# Patient Record
Sex: Female | Born: 1995
Health system: Southern US, Community
[De-identification: ages and names within clinical notes are randomized; demographics above are authoritative.]

## PROBLEM LIST (undated history)

## (undated) ENCOUNTER — Inpatient Hospital Stay (HOSPITAL_COMMUNITY): Payer: Self-pay

## (undated) DIAGNOSIS — N12 Tubulo-interstitial nephritis, not specified as acute or chronic: Secondary | ICD-10-CM

## (undated) DIAGNOSIS — J302 Other seasonal allergic rhinitis: Secondary | ICD-10-CM

## (undated) DIAGNOSIS — D649 Anemia, unspecified: Secondary | ICD-10-CM

## (undated) HISTORY — PX: WISDOM TOOTH EXTRACTION: SHX21

## (undated) HISTORY — PX: MOUTH SURGERY: SHX715

---
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
## 2016-01-04 ENCOUNTER — Emergency Department (HOSPITAL_COMMUNITY)
Admission: EM | Admit: 2016-01-04 | Discharge: 2016-01-04 | Disposition: A | Discharge status: Home / Self Care | Payer: Medicaid Other | Attending: Physician Assistant | Admitting: Physician Assistant

## 2016-01-04 ENCOUNTER — Encounter (HOSPITAL_COMMUNITY): Payer: Self-pay | Admitting: Emergency Medicine

## 2016-01-04 DIAGNOSIS — F17210 Nicotine dependence, cigarettes, uncomplicated: Secondary | ICD-10-CM | POA: Insufficient documentation

## 2016-01-04 DIAGNOSIS — K0889 Other specified disorders of teeth and supporting structures: Secondary | ICD-10-CM | POA: Insufficient documentation

## 2016-01-04 DIAGNOSIS — Z79899 Other long term (current) drug therapy: Secondary | ICD-10-CM | POA: Insufficient documentation

## 2016-01-04 MED ORDER — CLINDAMYCIN HCL 150 MG PO CAPS: 450.0000 mg | ORAL_CAPSULE | Freq: Three times a day (TID) | ORAL | 0 refills | Status: AC

## 2016-01-04 NOTE — ED Notes (Signed)
Declined W/C at D/C and was escorted to lobby by RN. 

## 2016-01-04 NOTE — ED Triage Notes (Signed)
Pt. Stated, I broke my tooth off bottom right about a week half ago, and now my face is swollen Im pretty sure I have an abscess

## 2016-01-04 NOTE — Discharge Instructions (Signed)
Please schedule an appointment with your dentist today for follow up. Take Clindamycin  Contact a health care provider if: You continue to have tooth pain after a tooth injury. Your tooth is sensitive to heat and cold. You develop swelling near your injured tooth. You have a fever. You are unable to open your jaw. You are drooling and it is getting worse.

## 2016-01-04 NOTE — ED Provider Notes (Signed)
[facility]-EMERGENCY DEPT Provider Note   CSN: [id number] Arrival date & time: 01/04/16  1636 By signing my name below, I, Sherry Leach, attest that this documentation has been prepared under the direction and in the presence of Sherry Leach, [place]. Electronically Signed: Bridgette HabermannMaria Leach, ED Scribe. 01/04/16. 5:46 PM.  History   Chief Complaint Chief Complaint  Patient presents with  . Dental Problem  . Facial Swelling   HPI Comments: Sherry Leach is a 20 y.o. female with no pertinent PMHx, who presents to the Emergency Department complaining of gradually worsening, 8/10, lower right dental pain onset 3 days ago with associated facial swelling. Pt reports she broke a bottom right tooth eating about 1.5 weeks ago. Pain is exacerbated with chewing. She has taken 500mg  Ibuprofen with temporary relief. Pt has h/o abscesses and notes her symptoms feel "exactly the same". She does not have a dentist she regularly follows up with at this time. Denies IV drug use. Pt further denies fever, chills, chest pain, shortness of breath, nausea, vomiting, or any other associated symptoms.   The history is provided by the patient. No language interpreter was used.    History reviewed. No pertinent past medical history.  Patient Active Problem List   Diagnosis Date Noted  . Dry skin dermatitis 02/15/2013  . Candidiasis of vulva and vagina 02/15/2013  . Dysmenorrhea 02/15/2013  . Anemia 02/15/2013  . Irregular menstrual cycle 02/15/2013    Past Surgical History:  Procedure Laterality Date  . MOUTH SURGERY      OB History    No data available       Home Medications    Prior to Admission medications   Medication Sig Start Date End Date Taking? Authorizing Provider  amoxicillin (AMOXIL) 500 MG capsule Take 1 capsule (500 mg total) by mouth 3 (three) times daily. Patient not taking: Reported on 01/04/2016 08/03/13   Sherry Speedobyn M Hess, PA-C  ciprofloxacin (CIPRO) 500 MG tablet Take 1 tablet (500 mg total) by  mouth 2 (two) times daily. One po bid x 7 days Patient not taking: Reported on 01/04/2016 08/15/13   Sherry Boozerobyn M Hess, PA-C  clindamycin (CLEOCIN) 150 MG capsule Take 3 capsules (450 mg total) by mouth 3 (three) times daily. 01/04/16 01/11/16  Sherry Leach Sherry Leach, Sherry Leach  HYDROcodone-acetaminophen (NORCO/VICODIN) 5-325 MG per tablet Take 1-2 tablets by mouth every 4 (four) hours as needed. Patient not taking: Reported on 01/04/2016 08/15/13   Sherry Speedobyn M Hess, PA-C  ibuprofen (ADVIL,MOTRIN) 600 MG tablet Take 1 tablet (600 mg total) by mouth every 6 (six) hours as needed. Patient not taking: Reported on 01/04/2016 08/03/13   Sherry Boozerobyn M Hess, PA-C  ondansetron (ZOFRAN) 4 MG tablet Take 1 tablet (4 mg total) by mouth every 6 (six) hours. Patient not taking: Reported on 01/04/2016 08/15/13   Sherry Speedobyn M Hess, PA-C    Family History Family History  Problem Relation Age of Onset  . Hypertension Mother   . Hypertension Other     Social History Social History  Substance Use Topics  . Smoking status: Current Every Day Smoker    Types: Cigarettes  . Smokeless tobacco: Never Used  . Alcohol use No     Allergies   Penicillins   Review of Systems Review of Systems  Constitutional: Negative for chills and fever.  HENT: Positive for dental problem and facial swelling.   Respiratory: Negative for shortness of breath.   Cardiovascular: Negative for chest pain.  Gastrointestinal: Negative for abdominal pain, constipation, diarrhea, nausea  and vomiting.  Genitourinary: Negative for difficulty urinating.     Physical Exam Updated Vital Signs BP 120/71 (BP Location: Right Arm)   Pulse 61   Temp 98.4 F (36.9 C) (Oral)   Resp 15   Ht 5' 8.5" (1.74 m)   Wt 63.5 kg   LMP 12/21/2015   SpO2 100%   BMI 20.98 kg/m   Physical Exam  Constitutional: She appears well-developed and well-nourished.  HENT:  Head: Normocephalic.  Mouth/Throat: Uvula is midline and oropharynx is clear and moist. Abnormal dentition.    Broken lower right second molar that is tender to palpation. No obvious swelling or erythema of gum. Mild swelling of the right cheek.  Eyes: Conjunctivae and EOM are normal. Pupils are equal, round, and reactive to light.  Neck: Normal range of motion. Neck supple.  Cardiovascular: Normal rate, regular rhythm and normal heart sounds.  Exam reveals no gallop and no friction rub.   No murmur heard. Pulmonary/Chest: Effort normal and breath sounds normal. No respiratory distress. She has no wheezes. She has no rales.  Abdominal: She exhibits no distension.  Musculoskeletal: Normal range of motion.  Neurological: She is alert.  Skin: Skin is warm and dry.  Psychiatric: She has a normal mood and affect. Her behavior is normal.  Nursing note and vitals reviewed.   ED Treatments / Results  DIAGNOSTIC STUDIES: Oxygen Saturation is 97% on RA, adequate by my interpretation.    COORDINATION OF CARE: 5:41 PM Discussed treatment plan with pt at bedside and pt agreed to plan.  Labs (all labs ordered are listed, but only abnormal results are displayed) Labs Reviewed - No data to display  EKG  EKG Interpretation None       Radiology No results found.  Procedures Procedures (including critical care time)  Medications Ordered in ED Medications - No data to display   Initial Impression / Assessment and Plan / ED Course  I have reviewed the triage vital signs and the nursing notes.  Pertinent labs & imaging results that were available during my care of the patient were reviewed by me and considered in my medical decision making (see chart for details).  Clinical Course   Patient with dentalgia. No obvious abscess requiring immediate incision and drainage. Exam not concerning for Ludwig's angina or pharyngeal abscess.  Will treat with antibiotics. Pt instructed to follow-up with dentist tomorrow for follow. Discussed return precautions. Pt is NAD, VSS, afebrile. Pt safe for  discharge.  Final Clinical Impressions(s) / ED Diagnoses   Final diagnoses:  Pain, dental     New Prescriptions Discharge Medication List as of 01/04/2016  6:09 PM    START taking these medications   Details  clindamycin (CLEOCIN) 150 MG capsule Take 3 capsules (450 mg total) by mouth 3 (three) times daily., Starting Mon 01/04/2016, Until Mon 01/11/2016, Print       I personally performed the services described in this documentation, which was scribed in my presence. The recorded information has been reviewed and is accurate.    [address]Cheron Coryell Sherry YorkEspina, Sherry Leach 01/04/16 1826    Courteney Randall AnLyn Mackuen, MD 01/04/16 2100

## 2016-07-21 ENCOUNTER — Emergency Department (HOSPITAL_COMMUNITY)
Admission: EM | Admit: 2016-07-21 | Discharge: 2016-07-21 | Disposition: A | Discharge status: Home / Self Care | Payer: Medicaid Other | Attending: Emergency Medicine | Admitting: Emergency Medicine

## 2016-07-21 ENCOUNTER — Encounter (HOSPITAL_COMMUNITY): Payer: Self-pay | Admitting: Emergency Medicine

## 2016-07-21 DIAGNOSIS — Z79899 Other long term (current) drug therapy: Secondary | ICD-10-CM | POA: Insufficient documentation

## 2016-07-21 DIAGNOSIS — E860 Dehydration: Secondary | ICD-10-CM | POA: Insufficient documentation

## 2016-07-21 DIAGNOSIS — R51 Headache: Secondary | ICD-10-CM | POA: Insufficient documentation

## 2016-07-21 DIAGNOSIS — R519 Headache, unspecified: Secondary | ICD-10-CM

## 2016-07-21 DIAGNOSIS — F17210 Nicotine dependence, cigarettes, uncomplicated: Secondary | ICD-10-CM | POA: Insufficient documentation

## 2016-07-21 LAB — CBC
HCT: 35.9 % — ABNORMAL LOW (ref 36.0–46.0)
HEMOGLOBIN: 11.6 g/dL — AB (ref 12.0–15.0)
MCH: 27.4 pg (ref 26.0–34.0)
MCHC: 32.3 g/dL (ref 30.0–36.0)
MCV: 84.7 fL (ref 78.0–100.0)
PLATELETS: 200 10*3/uL (ref 150–400)
RBC: 4.24 MIL/uL (ref 3.87–5.11)
RDW: 13.8 % (ref 11.5–15.5)
WBC: 8.1 10*3/uL (ref 4.0–10.5)

## 2016-07-21 LAB — BASIC METABOLIC PANEL
Anion gap: 7 (ref 5–15)
BUN: 5 mg/dL — ABNORMAL LOW (ref 6–20)
CO2: 25 mmol/L (ref 22–32)
CREATININE: 0.63 mg/dL (ref 0.44–1.00)
Calcium: 9.1 mg/dL (ref 8.9–10.3)
Chloride: 102 mmol/L (ref 101–111)
GFR calc Af Amer: >60 mL/min (ref >60)
GFR calc non Af Amer: >60 mL/min (ref >60)
Glucose, Bld: 95 mg/dL (ref 65–99)
Potassium: 4.1 mmol/L (ref 3.5–5.1)
Sodium: 134 mmol/L — ABNORMAL LOW (ref 135–145)

## 2016-07-21 LAB — I-STAT BETA HCG BLOOD, ED (MC, WL, AP ONLY): I-stat hCG, quantitative: <5 m[IU]/mL (ref <5)

## 2016-07-21 MED ORDER — ACETAMINOPHEN 325 MG PO TABS
650.0000 mg | ORAL_TABLET | Freq: Once | ORAL | Status: AC
  Administered 2016-07-21: 650 mg via ORAL
  Filled 2016-07-21: qty 2

## 2016-07-21 NOTE — ED Triage Notes (Signed)
Pt presents to ED for assesment of a left sided headache x 2 days intermittent, with episodes of intermittent dizziness and light-headedness.  Patient states she normally has orthostatic hypotension, but worse recently.  States eating and drinking normally.  Denies any other neuro deficits.

## 2016-07-21 NOTE — ED Notes (Signed)
Pt verbalized understanding discharge instructions and denies any further needs or questions at this time. VS stable, ambulatory and steady gait.   

## 2016-07-21 NOTE — ED Provider Notes (Signed)
[facility]EMER[facility]Y DEPT Provider Note   CSN: [id number] Arrival date & time: 07/21/16  2136     History   Chief Complaint Chief Complaint  Patient presents with  . Headache  . Near Syncope    HPI Sherry Leach is a 21 y.o. female.  HPI Patient ports headache 2 days and lightheadedness and weakness.  She states she's been outside and was constantly for the past 4 days in the heat.  She's been eating normally but did not drink a lot of fluids.  She feels slightly weak and dehydrated.  Denies fevers and chills.  No weakness of her arms or legs.  Denies head injury or head trauma.  No use of anticoagulants.  Denies neck pain.  No other complaints.  Symptoms are mild in severity   History reviewed. No pertinent past medical history.  Patient Active Problem List   Diagnosis Date Noted  . Dry skin dermatitis 02/15/2013  . Candidiasis of vulva and vagina 02/15/2013  . Dysmenorrhea 02/15/2013  . Anemia 02/15/2013  . Irregular menstrual cycle 02/15/2013    Past Surgical History:  Procedure Laterality Date  . MOUTH SURGERY      OB History    No data available       Home Medications    Prior to Admission medications   Medication Sig Start Date End Date Taking? Authorizing Provider  Multiple Vitamin (MULTIVITAMIN WITH MINERALS) TABS tablet Take 1 tablet by mouth daily.   Yes [provider]    Family History Family History  Problem Relation Age of Onset  . Hypertension Mother   . Hypertension Other     Social History Social History  Substance Use Topics  . Smoking status: Current Every Day Smoker    Packs/day: 0.50    Types: Cigarettes  . Smokeless tobacco: Never Used  . Alcohol use No     Allergies   Penicillins   Review of Systems Review of Systems  All other systems reviewed and are negative.    Physical Exam Updated Vital Signs BP (!) 92/59   Pulse 87   Temp 98.1 F (36.7 C) (Oral)   Resp 16   LMP 06/02/2016   SpO2 99%   Physical  Exam  Constitutional: She is oriented to person, place, and time. She appears well-developed and well-nourished. No distress.  HENT:  Head: Normocephalic and atraumatic.  Eyes: EOM are normal. Pupils are equal, round, and reactive to light.  Neck: Normal range of motion.  Cardiovascular: Normal rate, regular rhythm and normal heart sounds.   Pulmonary/Chest: Effort normal and breath sounds normal.  Abdominal: Soft. She exhibits no distension. There is no tenderness.  Musculoskeletal: Normal range of motion.  Neurological: She is alert and oriented to person, place, and time.  5/5 strength in major muscle groups of  bilateral upper and lower extremities. Speech normal. No facial asymetry.   Skin: Skin is warm and dry.  Psychiatric: She has a normal mood and affect. Judgment normal.  Nursing note and vitals reviewed.    ED Treatments / Results  Labs (all labs ordered are listed, but only abnormal results are displayed) Labs Reviewed  BASIC METABOLIC PANEL - Abnormal; Notable for the following:       Result Value   Sodium 134 (*)    BUN 5 (*)    All other components within normal limits  CBC - Abnormal; Notable for the following:    Hemoglobin 11.6 (*)    HCT 35.9 (*)  All other components within normal limits  I-STAT BETA HCG BLOOD, ED ([facility], [facility], [facility] ONLY)    EKG  EKG Interpretation None       Radiology No results found.  Procedures Procedures (including critical care time)  Medications Ordered in ED Medications  acetaminophen (TYLENOL) tablet 650 mg (not administered)     Initial Impression / Assessment and Plan / ED Course  I have reviewed the triage vital signs and the nursing notes.  Pertinent labs & imaging results that were available during my care of the patient were reviewed by me and considered in my medical decision making (see chart for details).     Patient is well-appearing.  Suspect mild dehydration.  Oral fluid replacement in the ER.  Tylenol  for headache.  Nonfocal exam.  Vital signs stable.  Final Clinical Impressions(s) / ED Diagnoses   Final diagnoses:  Acute nonintractable headache, unspecified headache type  Dehydration    New Prescriptions New Prescriptions   No medications on file     Azalia Bilisampos, Hong Timm, MD 07/21/16 2255

## 2016-07-21 NOTE — ED Notes (Signed)
Pt able to ambulate back to room, steady gait.

## 2016-11-24 ENCOUNTER — Encounter (HOSPITAL_COMMUNITY): Payer: Self-pay

## 2016-11-24 ENCOUNTER — Emergency Department (HOSPITAL_COMMUNITY)
Admission: EM | Admit: 2016-11-24 | Discharge: 2016-11-24 | Disposition: A | Discharge status: Home / Self Care | Payer: Self-pay | Attending: Emergency Medicine | Admitting: Emergency Medicine

## 2016-11-24 DIAGNOSIS — J069 Acute upper respiratory infection, unspecified: Secondary | ICD-10-CM | POA: Insufficient documentation

## 2016-11-24 DIAGNOSIS — H9202 Otalgia, left ear: Secondary | ICD-10-CM | POA: Insufficient documentation

## 2016-11-24 DIAGNOSIS — R0981 Nasal congestion: Secondary | ICD-10-CM | POA: Insufficient documentation

## 2016-11-24 DIAGNOSIS — F17210 Nicotine dependence, cigarettes, uncomplicated: Secondary | ICD-10-CM | POA: Insufficient documentation

## 2016-11-24 MED ORDER — AZITHROMYCIN 250 MG PO TABS: 250.0000 mg | ORAL_TABLET | Freq: Every day | ORAL | 0 refills | Status: DC

## 2016-11-24 NOTE — ED Triage Notes (Signed)
Patient complains of congestion, left ear pain and sore throat x 3 days, NAD

## 2016-11-24 NOTE — ED Provider Notes (Signed)
[facility] EMERGENCY DEPARTMENT Provider Note   CSN: [id number] Arrival date & time: 11/24/16  1210   History   Chief Complaint Chief Complaint  Patient presents with  . Nasal Congestion    HPI Sherry Leach is a 21 y.o. female.  HPI   Patient comes to the ER with complaints of sore throat, nasal congestion and left ear pain for the past 3 days. She has been more tired than usual and called out from work today. She has not had fevers, N/V/D, back pain, weakness, neck pain or headaches  History reviewed. No pertinent past medical history.  Patient Active Problem List   Diagnosis Date Noted  . Dry skin dermatitis 02/15/2013  . Candidiasis of vulva and vagina 02/15/2013  . Dysmenorrhea 02/15/2013  . Anemia 02/15/2013  . Irregular menstrual cycle 02/15/2013    Past Surgical History:  Procedure Laterality Date  . MOUTH SURGERY      OB History    No data available       Home Medications    Prior to Admission medications   Medication Sig Start Date End Date Taking? Authorizing Provider  azithromycin (ZITHROMAX) 250 MG tablet Take 1 tablet (250 mg total) by mouth daily. Take first 2 tablets together, then 1 every day until finished. 11/24/16   Marlon Pel, PA-C  Multiple Vitamin (MULTIVITAMIN WITH MINERALS) TABS tablet Take 1 tablet by mouth daily.    [provider]    Family History Family History  Problem Relation Age of Onset  . Hypertension Mother   . Hypertension Other     Social History Social History  Substance Use Topics  . Smoking status: Current Every Day Smoker    Packs/day: 0.50    Types: Cigarettes  . Smokeless tobacco: Never Used  . Alcohol use No     Allergies   Penicillins   Review of Systems Review of Systems  Negative ROS aside from pertinent positives and negatives as listed in HPI  Physical Exam Updated Vital Signs BP 130/86   Pulse 86   Temp 98.2 F (36.8 C)   Resp 16   SpO2 98%    Physical Exam  Constitutional: She is oriented to person, place, and time. She appears well-developed and well-nourished. No distress.  HENT:  Head: Normocephalic and atraumatic.  Right Ear: Tympanic membrane, external ear and ear canal normal.  Left Ear: Tympanic membrane, external ear and ear canal normal.  Nose: No rhinorrhea. Right sinus exhibits frontal sinus tenderness. Right sinus exhibits no maxillary sinus tenderness. Left sinus exhibits frontal sinus tenderness. Left sinus exhibits no maxillary sinus tenderness.  Mouth/Throat: Uvula is midline and mucous membranes are normal. No trismus in the jaw. Normal dentition. No dental abscesses or uvula swelling. Oropharyngeal exudate and posterior oropharyngeal edema present. No posterior oropharyngeal erythema or tonsillar abscesses.  No submental edema, tongue not elevated, no trismus. No impending airway obstruction; Pt able to speak full sentences, swallow intact, no drooling, stridor, or tonsillar/uvula displacement. No palatal petechia  + nasal congestion  Eyes: Conjunctivae are normal.  Neck: Trachea normal, normal range of motion and full passive range of motion without pain. Neck supple. No neck rigidity. Normal range of motion present. No Brudzinski's sign noted.  Flexion and extension of neck without pain or difficulty. Able to breath without difficulty in extension.  Cardiovascular: Normal rate and regular rhythm.   Pulmonary/Chest: Effort normal and breath sounds normal. No stridor. No respiratory distress. She has no wheezes.  Abdominal:  Soft. There is no tenderness.  No obvious evidence of splenomegaly. Non ttp.   Musculoskeletal: Normal range of motion.  Lymphadenopathy:       Head (right side): No preauricular and no posterior auricular adenopathy present.       Head (left side): No preauricular and no posterior auricular adenopathy present.    She has cervical adenopathy.  Neurological: She is alert and oriented to  person, place, and time.  Skin: Skin is warm and dry. No rash noted. She is not diaphoretic.  Psychiatric: She has a normal mood and affect.  Nursing note and vitals reviewed.    ED Treatments / Results  Labs (all labs ordered are listed, but only abnormal results are displayed) Labs Reviewed - No data to display  EKG  EKG Interpretation None       Radiology No results found.  Procedures Procedures (including critical care time)  Medications Ordered in ED Medications - No data to display   Initial Impression / Assessment and Plan / ED Course  I have reviewed the triage vital signs and the nursing notes.  Pertinent labs & imaging results that were available during my care of the patient were reviewed by me and considered in my medical decision making (see chart for details).     Blood pressure 130/86, pulse 86, temperature 98.2 F (36.8 C), resp. rate 16, SpO2 98 %.  Sherry Leach has been evaluated today in the emergency department. The appropriate screening and testing was been performed and I believe the patient to be medically stable for discharge.   Return signs and symptoms have been discussed with the patient and/or caregivers and they have voiced their understanding. The patient has agreed to follow-up with their primary care provider or the referred specialist.    Final Clinical Impressions(s) / ED Diagnoses   Final diagnoses:  Upper respiratory tract infection, unspecified type    New Prescriptions New Prescriptions   AZITHROMYCIN (ZITHROMAX) 250 MG TABLET    Take 1 tablet (250 mg total) by mouth daily. Take first 2 tablets together, then 1 every day until finished.     Marlon PelGreene, Blandon Offerdahl, PA-C 11/24/16 1503    Cathren LaineSteinl, Kevin, MD 11/25/16 [phone]

## 2016-11-29 ENCOUNTER — Emergency Department (HOSPITAL_BASED_OUTPATIENT_CLINIC_OR_DEPARTMENT_OTHER)
Admission: EM | Admit: 2016-11-29 | Discharge: 2016-11-29 | Disposition: A | Discharge status: Home / Self Care | Payer: Self-pay | Attending: Emergency Medicine | Admitting: Emergency Medicine

## 2016-11-29 ENCOUNTER — Encounter (HOSPITAL_BASED_OUTPATIENT_CLINIC_OR_DEPARTMENT_OTHER): Payer: Self-pay | Admitting: Emergency Medicine

## 2016-11-29 DIAGNOSIS — R0981 Nasal congestion: Secondary | ICD-10-CM | POA: Insufficient documentation

## 2016-11-29 DIAGNOSIS — J309 Allergic rhinitis, unspecified: Secondary | ICD-10-CM | POA: Insufficient documentation

## 2016-11-29 DIAGNOSIS — H6122 Impacted cerumen, left ear: Secondary | ICD-10-CM | POA: Insufficient documentation

## 2016-11-29 DIAGNOSIS — F17210 Nicotine dependence, cigarettes, uncomplicated: Secondary | ICD-10-CM | POA: Insufficient documentation

## 2016-11-29 MED ORDER — FLUTICASONE PROPIONATE 50 MCG/ACT NA SUSP: 2.0000 | Freq: Every day | NASAL | 0 refills | Status: DC

## 2016-11-29 MED ORDER — CARBAMIDE PEROXIDE 6.5 % OT SOLN: 5.0000 [drp] | Freq: Two times a day (BID) | OTIC | 0 refills | Status: DC

## 2016-11-29 NOTE — ED Notes (Signed)
Large amount hard wax flushed from left ear

## 2016-11-29 NOTE — ED Provider Notes (Signed)
[facility] EMERGENCY DEPARTMENT Provider Note   CSN: [id number] Arrival date & time: 11/29/16  1706     History   Chief Complaint Chief Complaint  Patient presents with  . Otalgia    HPI Sherry Leach is a 21 y.o. female.  Sherry Leach is a 21 y.o. Female who presents to the ED complaining of left ear pain ongoing for one week. She reports she was seen in the ED recently and prescribed a course of antibiotics.  She reports her ear pain has persisted despite completion of the antibiotic. She reports associated nasal congestion. No other treatments today. No fevers. She reports feeling better after RN washed out ear due to ear wax. She denies fevers, ear discharge, sore throat, trouble swallowing.    The history is provided by the patient and medical records. No language interpreter was used.  Otalgia  Associated symptoms include rhinorrhea. Pertinent negatives include no ear discharge, no headaches, no hearing loss, no sore throat, no neck pain, no cough and no rash.    History reviewed. No pertinent past medical history.  Patient Active Problem List   Diagnosis Date Noted  . Dry skin dermatitis 02/15/2013  . Candidiasis of vulva and vagina 02/15/2013  . Dysmenorrhea 02/15/2013  . Anemia 02/15/2013  . Irregular menstrual cycle 02/15/2013    Past Surgical History:  Procedure Laterality Date  . MOUTH SURGERY      OB History    No data available       Home Medications    Prior to Admission medications   Medication Sig Start Date End Date Taking? Authorizing Provider  azithromycin (ZITHROMAX) 250 MG tablet Take 1 tablet (250 mg total) by mouth daily. Take first 2 tablets together, then 1 every day until finished. 11/24/16   Marlon PelGreene, Tiffany, PA-C  carbamide peroxide (DEBROX) 6.5 % OTIC solution Place 5 drops into both ears 2 (two) times daily. 11/29/16   Everlene Farrieransie, Allison Deshotels, PA-C  fluticasone (FLONASE) 50 MCG/ACT nasal spray Place 2 sprays into both nostrils  daily. 11/29/16   Everlene Farrieransie, Jamani Bearce, PA-C  Multiple Vitamin (MULTIVITAMIN WITH MINERALS) TABS tablet Take 1 tablet by mouth daily.    [provider]    Family History Family History  Problem Relation Age of Onset  . Hypertension Mother   . Hypertension Other     Social History Social History  Substance Use Topics  . Smoking status: Current Every Day Smoker    Packs/day: 0.50    Types: Cigarettes  . Smokeless tobacco: Never Used  . Alcohol use No     Allergies   Penicillins   Review of Systems Review of Systems  Constitutional: Negative for chills and fever.  HENT: Positive for congestion, ear pain, rhinorrhea and sneezing. Negative for ear discharge, facial swelling, hearing loss, mouth sores, sore throat, tinnitus, trouble swallowing and voice change.   Eyes: Negative for visual disturbance.  Respiratory: Negative for cough.   Musculoskeletal: Negative for neck pain.  Skin: Negative for rash.  Neurological: Negative for headaches.     Physical Exam Updated Vital Signs BP 122/87   Pulse 88   Temp 98.1 F (36.7 C) (Oral)   Resp 16   LMP 09/29/2016   SpO2 100%   Physical Exam  Constitutional: She appears well-developed and well-nourished. No distress.  Nontoxic-appearing.  HENT:  Head: Normocephalic and atraumatic.  Right Ear: External ear normal.  Left Ear: External ear normal.  Mouth/Throat: Oropharynx is clear and moist. No oropharyngeal exudate.  Left ear cerumen impaction has resolved after wash out by RN. No TM erythema or loss of landmarks bilaterally. No loss of hearing. Mild clear middle ear effusion noted bilaterally.   Eyes: Pupils are equal, round, and reactive to light. EOM are normal. Right eye exhibits no discharge. Left eye exhibits no discharge.  Neck: Normal range of motion. Neck supple.  Cardiovascular: Normal rate, regular rhythm and intact distal pulses.   Pulmonary/Chest: Effort normal. No respiratory distress.  Neurological:  She is alert. Coordination normal.  Skin: Skin is warm and dry. No rash noted. She is not diaphoretic. No erythema. No pallor.  Psychiatric: She has a normal mood and affect. Her behavior is normal.  Nursing note and vitals reviewed.    ED Treatments / Results  Labs (all labs ordered are listed, but only abnormal results are displayed) Labs Reviewed - No data to display  EKG  EKG Interpretation None       Radiology No results found.  Procedures Procedures (including critical care time)  Medications Ordered in ED Medications - No data to display   Initial Impression / Assessment and Plan / ED Course  I have reviewed the triage vital signs and the nursing notes.  Pertinent labs & imaging results that were available during my care of the patient were reviewed by me and considered in my medical decision making (see chart for details).     This  is a 21 y.o. Female who presents to the ED complaining of left ear pain ongoing for one week. She reports she was seen in the ED recently and prescribed a course of antibiotics.  She reports her ear pain has persisted despite completion of the antibiotic. She reports associated nasal congestion. No other treatments today. No fevers. She reports feeling better after RN washed out ear due to ear wax. On exam the patient is afebrile and non-toxic appearing.  Cerumen impaction has resolved.  She is feeling much better.  No evidence of ear infection.  She does have mild clear middle ear effusion bilaterally.  Will start on Flonase for some eustachian tube dysfunction and allergic rhinitis.  Patient also provided with prescription for Debrox drops for maintenance of earwax to prevent this from happening again.  Return precautions discussed. I advised the patient to follow-up with their primary care provider this week. I advised the patient to return to the emergency department with new or worsening symptoms or new concerns. The patient verbalized  understanding and agreement with plan.     Final Clinical Impressions(s) / ED Diagnoses   Final diagnoses:  Impacted cerumen of left ear  Allergic rhinitis, unspecified seasonality, unspecified trigger    New Prescriptions New Prescriptions   CARBAMIDE PEROXIDE (DEBROX) 6.5 % OTIC SOLUTION    Place 5 drops into both ears 2 (two) times daily.   FLUTICASONE (FLONASE) 50 MCG/ACT NASAL SPRAY    Place 2 sprays into both nostrils daily.     Everlene Farrier, PA-C 11/29/16 1759    Arby Barrette, MD 11/30/16 [phone]

## 2016-11-29 NOTE — ED Triage Notes (Signed)
L ear pain x 1 week. States today was last dose of abx for ear infection and pain persists.

## 2017-01-11 ENCOUNTER — Encounter (HOSPITAL_COMMUNITY): Payer: Self-pay

## 2017-01-11 ENCOUNTER — Inpatient Hospital Stay (HOSPITAL_COMMUNITY)
Admission: AD | Admit: 2017-01-11 | Discharge: 2017-01-11 | Disposition: A | Discharge status: Home / Self Care | Payer: Medicaid Other | Source: Ambulatory Visit | Attending: Obstetrics and Gynecology | Admitting: Obstetrics and Gynecology

## 2017-01-11 ENCOUNTER — Inpatient Hospital Stay (HOSPITAL_COMMUNITY): Payer: Medicaid Other

## 2017-01-11 DIAGNOSIS — O26891 Other specified pregnancy related conditions, first trimester: Secondary | ICD-10-CM | POA: Insufficient documentation

## 2017-01-11 DIAGNOSIS — O26899 Other specified pregnancy related conditions, unspecified trimester: Secondary | ICD-10-CM

## 2017-01-11 DIAGNOSIS — R1030 Lower abdominal pain, unspecified: Secondary | ICD-10-CM | POA: Diagnosis present

## 2017-01-11 DIAGNOSIS — Z3A01 Less than 8 weeks gestation of pregnancy: Secondary | ICD-10-CM | POA: Diagnosis not present

## 2017-01-11 DIAGNOSIS — F17210 Nicotine dependence, cigarettes, uncomplicated: Secondary | ICD-10-CM | POA: Diagnosis not present

## 2017-01-11 DIAGNOSIS — O99331 Smoking (tobacco) complicating pregnancy, first trimester: Secondary | ICD-10-CM | POA: Diagnosis not present

## 2017-01-11 DIAGNOSIS — Z880 Allergy status to penicillin: Secondary | ICD-10-CM | POA: Insufficient documentation

## 2017-01-11 DIAGNOSIS — R109 Unspecified abdominal pain: Secondary | ICD-10-CM | POA: Diagnosis not present

## 2017-01-11 LAB — URINALYSIS, ROUTINE W REFLEX MICROSCOPIC
Bilirubin Urine: NEGATIVE
Glucose, UA: NEGATIVE mg/dL
Hgb urine dipstick: NEGATIVE
KETONES UR: 20 mg/dL — AB
Leukocytes, UA: NEGATIVE
Nitrite: POSITIVE — AB
PH: 7 (ref 5.0–8.0)
Specific Gravity, Urine: 1.019 (ref 1.005–1.030)

## 2017-01-11 LAB — CBC
HEMATOCRIT: 37.2 % (ref 36.0–46.0)
HEMOGLOBIN: 12.2 g/dL (ref 12.0–15.0)
MCH: 27.5 pg (ref 26.0–34.0)
MCHC: 32.8 g/dL (ref 30.0–36.0)
MCV: 83.8 fL (ref 78.0–100.0)
Platelets: 176 10*3/uL (ref 150–400)
RBC: 4.44 MIL/uL (ref 3.87–5.11)
RDW: 16 % — ABNORMAL HIGH (ref 11.5–15.5)
WBC: 9.3 10*3/uL (ref 4.0–10.5)

## 2017-01-11 LAB — HCG, QUANTITATIVE, PREGNANCY: HCG, BETA CHAIN, QUANT, S: 19675 m[IU]/mL — AB (ref <5)

## 2017-01-11 LAB — ABO/RH: ABO/RH(D): O NEG

## 2017-01-11 LAB — POCT PREGNANCY, URINE: Preg Test, Ur: POSITIVE — AB

## 2017-01-11 NOTE — Discharge Instructions (Signed)

## 2017-01-11 NOTE — MAU Provider Note (Signed)
History     CSN: [id number]  Arrival date and time: 01/11/17 1541   First Provider Initiated Contact with Patient 01/11/17 2022      Chief Complaint  Patient presents with  . Abdominal Pain   HPI   [name] is a 21 y.o.  female G1P0 @ 3485w6d here in MAU with lower abdominal crmaping. The pain is located on both sides of her lower abdomen. The pain comes and goes. The pain feels like it is in "my ovaries". No bleeding. Tried 500 mg of tylenol which did not work. Wants to know if she can take an increased dose.   OB History    Gravida Para Term Preterm AB Living   1             SAB TAB Ectopic Multiple Live Births                  No past medical history on file.  Past Surgical History:  Procedure Laterality Date  . MOUTH SURGERY      Family History  Problem Relation Age of Onset  . Hypertension Mother   . Hypertension Other     Social History   Tobacco Use  . Smoking status: Current Every Day Smoker    Packs/day: 0.50    Types: Cigarettes  . Smokeless tobacco: Never Used  Substance Use Topics  . Alcohol use: No  . Drug use: No    Allergies:  Allergies  Allergen Reactions  . Penicillins     Rash on arm     Medications Prior to Admission  Medication Sig Dispense Refill Last Dose  . azithromycin (ZITHROMAX) 250 MG tablet Take 1 tablet (250 mg total) by mouth daily. Take first 2 tablets together, then 1 every day until finished. 6 tablet 0   . carbamide peroxide (DEBROX) 6.5 % OTIC solution Place 5 drops into both ears 2 (two) times daily. 15 mL 0   . fluticasone (FLONASE) 50 MCG/ACT nasal spray Place 2 sprays into both nostrils daily. 16 g 0   . Multiple Vitamin (MULTIVITAMIN WITH MINERALS) TABS tablet Take 1 tablet by mouth daily.   Past Week at Unknown time   Results for orders placed or performed during the hospital encounter of 01/11/17 (from the past 48 hour(s))  Urinalysis, Routine w reflex microscopic     Status: Abnormal   Collection Time:  01/11/17  4:21 PM  Result Value Ref Range   Color, Urine YELLOW YELLOW   APPearance CLOUDY (A) CLEAR   Specific Gravity, Urine 1.019 1.005 - 1.030   pH 7.0 5.0 - 8.0   Glucose, UA NEGATIVE NEGATIVE mg/dL   Hgb urine dipstick NEGATIVE NEGATIVE   Bilirubin Urine NEGATIVE NEGATIVE   Ketones, ur 20 (A) NEGATIVE mg/dL   Protein, ur >=621>=300 (A) NEGATIVE mg/dL   Nitrite POSITIVE (A) NEGATIVE   Leukocytes, UA NEGATIVE NEGATIVE   RBC / HPF 0-5 0 - 5 RBC/hpf   WBC, UA 6-30 0 - 5 WBC/hpf   Bacteria, UA MANY (A) NONE SEEN   Squamous Epithelial / LPF 0-5 (A) NONE SEEN   Mucus PRESENT   Pregnancy, urine POC     Status: Abnormal   Collection Time: 01/11/17  5:17 PM  Result Value Ref Range   Preg Test, Ur POSITIVE (A) NEGATIVE    Comment:        THE SENSITIVITY OF THIS METHODOLOGY IS >24 mIU/mL   CBC     Status: Abnormal  Collection Time: 01/11/17  5:45 PM  Result Value Ref Range   WBC 9.3 4.0 - 10.5 K/uL   RBC 4.44 3.87 - 5.11 MIL/uL   Hemoglobin 12.2 12.0 - 15.0 g/dL   HCT 16.1 09.6 - 04.5 %   MCV 83.8 78.0 - 100.0 fL   MCH 27.5 26.0 - 34.0 pg   MCHC 32.8 30.0 - 36.0 g/dL   RDW 40.9 (H) 81.1 - 91.4 %   Platelets 176 150 - 400 K/uL  ABO/Rh     Status: None   Collection Time: 01/11/17  5:45 PM  Result Value Ref Range   ABO/RH(D) O NEG   hCG, quantitative, pregnancy     Status: Abnormal   Collection Time: 01/11/17  5:45 PM  Result Value Ref Range   hCG, Beta Chain, Quant, S 19,675 (H) <5 mIU/mL    Comment:          GEST. AGE      CONC.  (mIU/mL)   <=1 WEEK        5 - 50     2 WEEKS       50 - 500     3 WEEKS       100 - 10,000     4 WEEKS     1,000 - 30,000     5 WEEKS     3,500 - 115,000   6-8 WEEKS     12,000 - 270,000    12 WEEKS     15,000 - 220,000        FEMALE AND NON-PREGNANT FEMALE:     LESS THAN 5 mIU/mL    US Ob Comp Less 14 Wks  Result Date: 01/11/2017 CLINICAL DATA:  Intermittent abdominal cramping for 2 weeks. Last menstrual period 12/01/2016 EXAM: OBSTETRIC  <14 WK [place] AND TRANSVAGINAL OB US TECHNIQUE: Both transabdominal and transvaginal ultrasound examinations were performed for complete evaluation of the gestation as well as the maternal uterus, adnexal regions, and pelvic cul-de-sac. Transvaginal technique was performed to assess early pregnancy. COMPARISON:  None. FINDINGS: Intrauterine gestational sac: Present Yolk sac:  Present Embryo:  Present Cardiac Activity: Present Heart Rate: 92  bpm CRL:  2.9 mm  mm   5 w   5 d                  [place] EDC: 09/08/2017 Subchorionic hemorrhage:  None visualized. Maternal uterus/adnexae: Normal appearance of the right ovary. Left ovary is not visualized. IMPRESSION: Single live intrauterine pregnancy corresponding to 5 weeks and 5 days gestation. Fetal bradycardia. OBGYN follow-up is recommended. Electronically Signed   By: Ted Mcalpine M.D.   On: 01/11/2017 19:45   US Ob Transvaginal  Result Date: 01/11/2017 CLINICAL DATA:  Intermittent abdominal cramping for 2 weeks. Last menstrual period 12/01/2016 EXAM: OBSTETRIC <14 WK [place] AND TRANSVAGINAL OB US TECHNIQUE: Both transabdominal and transvaginal ultrasound examinations were performed for complete evaluation of the gestation as well as the maternal uterus, adnexal regions, and pelvic cul-de-sac. Transvaginal technique was performed to assess early pregnancy. COMPARISON:  None. FINDINGS: Intrauterine gestational sac: Present Yolk sac:  Present Embryo:  Present Cardiac Activity: Present Heart Rate: 92  bpm CRL:  2.9 mm  mm   5 w   5 d                  [place] EDC: 09/08/2017 Subchorionic hemorrhage:  None visualized. Maternal uterus/adnexae: Normal appearance of the right ovary. Left ovary is not visualized. IMPRESSION: Single  live intrauterine pregnancy corresponding to 5 weeks and 5 days gestation. Fetal bradycardia. OBGYN follow-up is recommended. Electronically Signed   By: Ted Mcalpineobrinka  Dimitrova M.D.   On: 01/11/2017 19:45   Review of Systems  Constitutional: Negative for  fever.  Gastrointestinal: Positive for abdominal pain. Negative for nausea and vomiting.  Genitourinary: Negative for dysuria, vaginal bleeding and vaginal discharge.   Physical Exam   Blood pressure 125/76, pulse 93, temperature 98.1 F (36.7 C), temperature source Oral, resp. rate 18, height 5' 7.5" (1.715 m), weight 133 lb 12 oz (60.7 kg), last menstrual period 12/01/2016, SpO2 99 %.  Physical Exam  Constitutional: She is oriented to person, place, and time. She appears well-developed and well-nourished. No distress.  HENT:  Head: Normocephalic.  Eyes: Pupils are equal, round, and reactive to light.  Neck: Neck supple.  GI: Soft. She exhibits no distension. There is no tenderness. There is no rebound and no guarding.  Genitourinary:  Genitourinary Comments: Bimanual: cervix closed, thick, posterior. Enlarged uterus.   Musculoskeletal: Normal range of motion.  Neurological: She is alert and oriented to person, place, and time.  Skin: Skin is warm. She is not diaphoretic.  Psychiatric: Her behavior is normal.   MAU Course  Procedures  None  MDM  Patient declined STI workup HIV, CBC, Hcg, ABO US OB transvaginal  Reviewed US in detail with the patient O negative blood type: No bleeding   Assessment and Plan   A:  1. Abdominal pain in pregnancy, antepartum   2. Abdominal pain in pregnancy, antepartum     P:  Discharge home with strict return precautions Start prenatal care Ok to take 2 extra strength tylenol as directed on the bottle   Sherry Leach, Harolyn RutherfordJennifer I, NP 01/11/2017 8:31 PM

## 2017-01-11 NOTE — MAU Note (Addendum)
4 pos home upt last week Cramping for 2 weeks- intermittent, reports it is intense, not cramping right now, has some intemittnet sharp pains No vaginal bleeding Would like preg confirmation for insurance

## 2017-01-12 LAB — HIV ANTIBODY (ROUTINE TESTING W REFLEX): HIV Screen 4th Generation wRfx: NONREACTIVE

## 2017-01-25 ENCOUNTER — Other Ambulatory Visit: Payer: Self-pay

## 2017-01-25 ENCOUNTER — Emergency Department (HOSPITAL_BASED_OUTPATIENT_CLINIC_OR_DEPARTMENT_OTHER)
Admission: EM | Admit: 2017-01-25 | Discharge: 2017-01-25 | Disposition: A | Discharge status: Home / Self Care | Payer: Medicaid Other | Attending: Emergency Medicine | Admitting: Emergency Medicine

## 2017-01-25 ENCOUNTER — Encounter (HOSPITAL_BASED_OUTPATIENT_CLINIC_OR_DEPARTMENT_OTHER): Payer: Self-pay

## 2017-01-25 DIAGNOSIS — R1110 Vomiting, unspecified: Secondary | ICD-10-CM | POA: Diagnosis present

## 2017-01-25 DIAGNOSIS — Z5321 Procedure and treatment not carried out due to patient leaving prior to being seen by health care provider: Secondary | ICD-10-CM | POA: Diagnosis not present

## 2017-01-25 NOTE — ED Notes (Signed)
No answer

## 2017-01-25 NOTE — ED Triage Notes (Signed)
C/o vomiting x 2 weeks-states she is [redacted] weeks pregnant- G1-NAD-steady gait-

## 2017-01-31 NOTE — L&D Delivery Note (Addendum)
Patient: [name] MRN: [id number]  GBS status: negative, IAP given no  Patient is a 22 y.o. now G1P1001 s/p NSVD at 1076w0d, who was admitted for IOL post-dates. ROM 0h 7445m prior to delivery with thick meconium fluid.    Delivery Note At 1:13 PM a viable female was delivered via SVD.  APGAR: 8, 9; weight 6 lb 12.8 oz (3085 g).    Head delivered ROA. Nuchal cord present, delivered through. Shoulder and body delivered in usual fashion. Infant with spontaneous cry, placed on mother's abdomen, dried and bulb suctioned. Cord clamped x 2 after 1-minute delay, and cut by family member. Cord blood drawn. Placenta delivered spontaneously with gentle cord traction, intact with 3-vessel cord. Fundus firm with massage and Pitocin.   Perineum inspected and found to have a 1st degree laceration which was repaired in the usual fashion. There was one left labial laceration repaired in the usual fashion. Repairs completed by Donette LarryMelanie Bhambri, CNM.   Anesthesia:  Epidural  Episiotomy: None Lacerations: 1st degree;Labial Est. Blood Loss (mL): 150  Mom to postpartum.  Baby to Couplet care / Skin to Skin.  Tamera StandsLaurel S Wallace, DO 09/14/2017, 5:59 PM

## 2017-02-20 ENCOUNTER — Encounter: Payer: Self-pay | Admitting: Advanced Practice Midwife

## 2017-02-20 ENCOUNTER — Ambulatory Visit (INDEPENDENT_AMBULATORY_CARE_PROVIDER_SITE_OTHER): Payer: Medicaid Other | Admitting: Advanced Practice Midwife

## 2017-02-20 DIAGNOSIS — Z3400 Encounter for supervision of normal first pregnancy, unspecified trimester: Secondary | ICD-10-CM | POA: Diagnosis not present

## 2017-02-20 DIAGNOSIS — Z3401 Encounter for supervision of normal first pregnancy, first trimester: Secondary | ICD-10-CM | POA: Diagnosis not present

## 2017-02-20 NOTE — Patient Instructions (Signed)
Safe Medications in Pregnancy   Acne: Benzoyl Peroxide Salicylic Acid  Backache/Headache: Tylenol: 2 regular strength every 4 hours OR              2 Extra strength every 6 hours  Colds/Coughs/Allergies: Benadryl (alcohol free) 25 mg every 6 hours as needed Breath right strips Claritin Cepacol throat lozenges Chloraseptic throat spray Cold-Eeze- up to three times per day Cough drops, alcohol free Flonase (by prescription only) Guaifenesin Mucinex Robitussin DM (plain only, alcohol free) Saline nasal spray/drops Sudafed (pseudoephedrine) & Actifed ** use only after [redacted] weeks gestation and if you do not have high blood pressure Tylenol Vicks Vaporub Zinc lozenges Zyrtec   Constipation: Colace Ducolax suppositories Fleet enema Glycerin suppositories Metamucil Milk of magnesia Miralax Senokot Smooth move tea  Diarrhea: Kaopectate Imodium A-D  *NO pepto Bismol  Hemorrhoids: Anusol Anusol HC Preparation H Tucks  Indigestion: Tums Maalox Mylanta Zantac  Pepcid  Insomnia: Benadryl (alcohol free) 60m every 6 hours as needed Tylenol PM Unisom, no Gelcaps  Leg Cramps: Tums MagGel  Nausea/Vomiting:  Bonine Dramamine Emetrol Ginger extract Sea bands Meclizine  Nausea medication to take during pregnancy:  Unisom (doxylamine succinate 25 mg tablets) Take one tablet daily at bedtime. If symptoms are not adequately controlled, the dose can be increased to a maximum recommended dose of two tablets daily (1/2 tablet in the morning, 1/2 tablet mid-afternoon and one at bedtime). Vitamin B6 1032mtablets. Take one tablet twice a day (up to 200 mg per day).  Skin Rashes: Aveeno products Benadryl cream or 2581mvery 6 hours as needed Calamine Lotion 1% cortisone cream  Yeast infection: Gyne-lotrimin 7 Monistat 7   **If taking multiple medications, please check labels to avoid duplicating the same active ingredients **take medication as directed on  the label ** Do not exceed 4000 mg of tylenol in 24 hours **Do not take medications that contain aspirin or ibuprofen  AREA PEDConcow1 [address]uite 400New PlymouthC  [id number] - [phone] - [phone] PED[facility]ATRICS OF GREMontpeliera87[address] CourtiLee MontePawleys IslandC [id number] - [phone] - [phone] ParPickeringC  [id number] - [phone] - [phone] Diamondlm[address]uiGarden ViewGreKickapoo Site 7C  274[address]254one - [phone] - [phone]eQuinwoodC  274[address]106one - [phone] - [phone] [address] Avenueuite 203299gTappanC  27237169one - [phone] - [phone]uiFeather SoundeCarencroC  27451025one - [phone] - [phone] [address] StreetyFayettevilleuiBermuda Run0 GreNew AthensC  27400867one - [phone] - [phone]eLibertyvilleC  27412458one - [phone] - [phone] Eye Group AscMBelgrade2Watchtowerlm[address]eMagazineC  274[address]902one - [phone] - [phone] FAMMonarch Mill16C. HigColfaxC  27322297one - [phone] - [phone] Nw PhoenixMILY MEDICINE AT TRIAD [id number] DriveuiFlorenceC  27463149one - [phone] - [phone]  [facility] [address] [place], [place]  [address] Phone - [phone]   Fax - [phone]  [facility] [address], [place], [place]  [address] Phone - [place], [place]  [address] Phone - [phone]   Fax - [place] [address] [place], [place]  [address] Phone - [phone]   Fax - [phone]  Max [address] [place], [place]  [address] Phone - [phone]   Fax - [place]. [place], [place]  [address] Phone - [phone]   Fax - [place], [place], [place]  [address] Phone - [phone]   Fax - [place] [address] [place], [place]  [address] Phone - [phone]   Fax - [phone]  DAVID RUBIN [address], [place], [place]  [address] Phone - [phone]   Fax - [place] [address], [place], [place]  [address] Phone - [phone]   Fax - [phone]  [place] [address] [place], [place]  [address] Phone - [phone]   Fax - [phone] Arnaldo Natal [address] [place], [place]  [address] Phone - [phone]   Fax - [place] [address] [place], [place]  [address] Phone - [phone]   Fax - [place] [address], [place], [place]  [address] Phone - [phone]   Fax - [phone]  Brenas MD [address] [place] [address] Phone [phone]  Fax [phone]  Places to have your son circumcised:    [facility] [phone] while you are in hospital  [facility] [phone] $244 by 4 wks  [facility] [phone] $175 by 2 wks  [facility] [phone] $250 by 7  days [facility] [phone] $150 by 4 wks  These prices sometimes change but are roughly what you can expect to pay. Please call and confirm pricing.   Circumcision is considered an elective/non-medically necessary procedure. There are many reasons parents decide to have their sons circumsized. During the first year of life circumcised males have a reduced risk of urinary tract infections but after this year the rates between circumcised males and uncircumcised males are the same.  It is safe to have your son circumcised outside of the hospital and the places above perform them regularly.   Deciding about Circumcision in Baby Boys  (Up-to-date The Basics)  What is circumcision?  Circumcision is a surgery that removes the skin that covers the tip of the penis, called the "foreskin" Circumcision is usually done when a boy is between 76 and 41 days old. In the [place], circumcision is common. In some other countries, fewer boys are circumcised. Circumcision is a common tradition in some religions.  Should I have my baby boy circumcised?  There is no easy answer. Circumcision has some benefits. But it also has risks. After talking with your doctor, you will have to decide for yourself what is right for your family.  What are the benefits of circumcision?  Circumcised boys seem to have slightly lower rates of: ?Urinary tract infections ?Swelling of the opening at the tip of the penis Circumcised men seem to have slightly  lower rates of: ?Urinary tract infections ?Swelling of the opening at the tip of the penis ?Penis cancer ?HIV and other infections that you catch during sex ?Cervical cancer in the women they have sex with Even so, in the [place], the risks of these problems are small - even in boys and men who have not been circumcised. Plus, boys and men who are not circumcised can reduce these extra risks by: ?Cleaning their penis well ?Using condoms during  sex  What are the risks of circumcision?  Risks include: ?Bleeding or infection from the surgery ?Damage to or amputation of the penis ?A chance that the doctor will cut off too much or not enough of the foreskin ?A chance that sex won't feel as good later in life Only about 1 out of every 200 circumcisions leads to problems. There is also a chance that your health insurance won't pay for circumcision.  How is circumcision done in baby boys?  First, the baby gets medicine for pain relief. This might be a cream on the skin or a shot into the base of the penis. Next, the doctor cleans the baby's penis well. Then he or she uses special tools to cut off the foreskin. Finally, the doctor wraps a bandage (called gauze) around the baby's penis. If you have your baby circumcised, his doctor or nurse will give you instructions on how to care for him after the surgery. It is important that you follow those instructions carefully. Childbirth Education Options: [facility] Department Classes:  Childbirth education classes can help you get ready for a positive parenting experience. You can also meet other expectant parents and get free stuff for your baby. Each class runs for five weeks on the same night and costs $45 for the mother-to-be and her support person. Medicaid covers the cost if you are eligible. Call [phone] to register. [facility] Childbirth Education:  [phone] or [phone] or [email or web address]  Baby & Me Class: Discuss newborn & infant parenting and family adjustment issues with other new mothers in a relaxed environment. Each week brings a new speaker or baby-centered activity. We encourage new mothers to join [place] every Thursday at 11:00am. Babies birth until crawling. No registration or fee. Daddy [employer]: This course offers Dads-to-be the tools and knowledge needed to feel confident on their journey to becoming new fathers. Experienced dads, who have  been trained as coaches, teach dads-to-be how to hold, comfort, diaper, swaddle and play with their infant while being able to support the new mom as well. A class for men taught by men. $25/dad Big Brother/Big Sister: Let your children share in the joy of a new brother or sister in this special class designed just for them. Class includes discussion about how families care for babies: swaddling, holding, diapering, safety as well as how they can be helpful in their new role. This class is designed for children ages 103 to 55, but any age is welcome. Please register each child individually. $5/child  Mom Talk: This mom-led group offers support and connection to mothers as they journey through the adjustments and struggles of that sometimes overwhelming first year after the birth of a child. Tuesdays at 10:00am and Thursdays at 6:00pm. Babies welcome. No registration or fee. Breastfeeding Support Group: This group is a mother-to-mother support circle where moms have the opportunity to share their breastfeeding experiences. A Lactation Consultant is present for questions and concerns. Meets each Tuesday at 11:00am. No fee or registration. Breastfeeding Your  Baby: Learn what to expect in the first days of breastfeeding your newborn.  This class will help you feel more confident with the skills needed to begin your breastfeeding experience. Many new mothers are concerned about breastfeeding after leaving the hospital. This class will also address the most common fears and challenges about breastfeeding during the first few weeks, months and beyond. (call for fee) Comfort Techniques and Tour: This 2 hour interactive class will provide you the opportunity to learn & practice hands-on techniques that can help relieve some of the discomfort of labor and encourage your baby to rotate toward the best position for birth. You and your partner will be able to try a variety of labor positions with birth balls and rebozos as  well as practice breathing, relaxation, and visualization techniques. A tour of the [facility] is included with this class. $20 per registrant and support person Childbirth Class- Weekend Option: This class is a Weekend version of our Birth & Baby series. It is designed for parents who have a difficult time fitting several weeks of classes into their schedule. It covers the care of your newborn and the basics of labor and childbirth. It also includes a [place] [facility] and lunch. The class is held two consecutive days: beginning on Friday evening from 6:30 - 8:30 p.m. and the next day, Saturday from 9 a.m. - 4 p.m. (call for fee) Doren Custard Class: Interested in a waterbirth?  This informational class will help you discover whether waterbirth is the right fit for you. Education about waterbirth itself, supplies you would need and how to assemble your support team is what you can expect from this class. Some obstetrical practices require this class in order to pursue a waterbirth. (Not all obstetrical practices offer waterbirth-check with your healthcare provider.) Register only the expectant mom, but you are encouraged to bring your partner to class! Required if planning waterbirth, no fee. Infant/Child CPR: Parents, grandparents, babysitters, and friends learn Cardio-Pulmonary Resuscitation skills for infants and children. You will also learn how to treat both conscious and unconscious choking in infants and children. This Family & Friends program does not offer certification. Register each participant individually to ensure that enough mannequins are available. (Call for fee) Grandparent Love: Expecting a grandbaby? This class is for you! Learn about the latest infant care and safety recommendations and ways to support your own child as he or she transitions into the parenting role. Taught by Registered Nurses who are childbirth instructors, but most  importantly...they are grandmothers too! $10/person. Childbirth Class- Natural Childbirth: This series of 5 weekly classes is for expectant parents who want to learn and practice natural methods of coping with the process of labor and childbirth. Relaxation, breathing, massage, visualization, role of the partner, and helpful positioning are highlighted. Participants learn how to be confident in their body's ability to give birth. This class will empower and help parents make informed decisions about their own care. Includes discussion that will help new parents transition into the immediate postpartum period. [facility] is included. We suggest taking this class between 25-32 weeks, but it's only a recommendation. $75 per registrant and one support person or $30 Medicaid. Childbirth Class- 3 week Series: This option of 3 weekly classes helps you and your labor partner prepare for childbirth. Newborn care, labor & birth, cesarean birth, pain management, and comfort techniques are discussed and a [place] [facility]  is included. The class meets at the same time, on the same day of the week for 3 consecutive weeks beginning with the starting date you choose. $60 for registrant and one support person.  Marvelous Multiples: Expecting twins, triplets, or more? This class covers the differences in labor, birth, parenting, and breastfeeding issues that face multiples' parents. NICU tour is included. Led by a Certified Childbirth Educator who is the mother of twins. No fee. Caring for Baby: This class is for expectant and adoptive parents who want to learn and practice the most up-to-date newborn care for their babies. Focus is on birth through the first six weeks of life. Topics include feeding, bathing, diapering, crying, umbilical cord care, circumcision care and safe sleep. Parents learn to recognize symptoms of illness and when to call the pediatrician.  Register only the mom-to-be and your partner or support person can plan to come with you! $10 per registrant and support person Childbirth Class- online option: This online class offers you the freedom to complete a Birth and Baby series in the comfort of your own home. The flexibility of this option allows you to review sections at your own pace, at times convenient to you and your support people. It includes additional video information, animations, quizzes, and extended activities. Get organized with helpful eClass tools, checklists, and trackers. Once you register online for the class, you will receive an email within a few days to accept the invitation and begin the class when the time is right for you. The content will be available to you for 60 days. $60 for 60 days of online access for you and your support people.  Local Doulas: Natural Baby Doulas [email or web address] Tel: [phone] [email or web address] [employer] [phone] [email or web address] The Labor Hassell Halim  (also do waterbirth tub rental) [phone] [email or web address] [email or web address] Birth Doula [phone] [email or web address] [facility] Rhythms  [phone] [email or web address] [employer] (PADA) [email or web address] [place] Birth and Baby  [email or web address]/ Considering Waterbirth? Guide for patients at [facility] [employer]  Why consider waterbirth?  . Gentle birth for babies . Less pain medicine used in labor . May allow for passive descent/less pushing . May reduce perineal tears  . More mobility and instinctive maternal position changes . Increased maternal relaxation . Reduced blood pressure in labor  Is waterbirth safe? What are the risks of infection, drowning or other  complications?  . Infection: o Very low risk (3.7 % for tub vs 4.8% for bed) o 7 in 8000 waterbirths with documented infection o Poorly cleaned equipment most common cause o Slightly lower group B strep transmission rate  . Drowning o Maternal:  - Very low risk   - Related to seizures or fainting o Newborn:  - Very low risk. No evidence of increased risk of respiratory problems in multiple large studies - Physiological protection from breathing under water - Avoid underwater birth if there are any fetal complications - Once baby's head is out of the water, keep it out.  . Birth complication o Some reports of cord trauma, but risk decreased by bringing baby to surface gradually o No evidence of increased risk of shoulder dystocia. Mothers can usually change positions faster in water than in a bed, possibly aiding the maneuvers to free the shoulder.   You must attend a Doren Custard class at [facility]  3rd Wednesday of every month from 7-9pm  Free  [employer] by calling [phone] or online at [email or web address]  Bring [place] the certificate from the class to your  prenatal appointment  Meet with a midwife at 36 weeks to see if you can still plan a waterbirth and to sign the consent.   Purchase or rent the following supplies:   Water Birth Pool (Birth Pool in a Box or Valley for instance)  (Tubs start ~$125)  Single-use disposable tub liner designed for your brand of tub  New garden hose labeled "lead-free", "suitable for drinking water",  Electric drain pump to remove water (We recommend 792 gallon per hour or greater pump.)   Separate garden hose to remove the dirty water  Fish net  Bathing suit top (optional)  Long-handled mirror (optional)  Places to purchase or rent supplies  GotWebTools.is for tub purchases and supplies  Waterbirthsolutions.com for tub purchases and supplies  The Labor Ladies (www.[email or web address]) $275 for tub rental/set-up  & take down/kit   [employer] (http://www.fleming.com/.htm) Information regarding doulas (labor support) who provide pool rentals  Our practice has a Birth Pool in a Box tub at the hospital that you may borrow on a first-come-first-served basis. It is your responsibility to to set up, clean and break down the tub. We cannot guarantee the availability of this tub in advance. You are responsible for bringing all accessories listed above. If you do not have all necessary supplies you cannot have a waterbirth.    Things that would prevent you from having a waterbirth:  Premature, <37wks  Previous cesarean birth  Presence of thick meconium-stained fluid  Multiple gestation (Twins, triplets, etc.)  Uncontrolled diabetes or gestational diabetes requiring medication  Hypertension requiring medication or diagnosis of pre-eclampsia  Heavy vaginal bleeding  Non-reassuring fetal heart rate  Active infection (MRSA, etc.). Group B Strep is NOT a contraindication for  waterbirth.  If your labor has to be induced and induction method requires continuous  monitoring of the baby's heart rate  Other risks/issues identified by your obstetrical provider  Please remember that birth is unpredictable. Under certain unforeseeable circumstances your provider may advise against giving birth in the tub. These decisions will be made on a case-by-case basis and with the safety of you and your baby as our highest priority.

## 2017-02-20 NOTE — Progress Notes (Signed)
  Subjective:    Sherry Leach is being seen today for her first obstetrical visit.  This is not a planned pregnancy. She is at 3355w4d gestation. Her obstetrical history is significant for none. Relationship with FOB: not together, ?if he will be involved. . Patient does intend to breast feed. Pregnancy history fully reviewed.  Patient reports no complaints.  Review of Systems:   Review of Systems  Constitutional: Negative for chills and fever.  Gastrointestinal: Negative for nausea and vomiting.  Genitourinary: Negative for pelvic pain and vaginal bleeding.    Objective:     BP 121/80   Pulse (!) 104   Wt 134 lb (60.8 kg)   LMP 12/01/2016   BMI 20.37 kg/m  Physical Exam  Nursing note and vitals reviewed. Constitutional: She is oriented to person, place, and time. She appears well-developed and well-nourished. No distress.  HENT:  Head: Normocephalic.  Cardiovascular: Normal rate.  Respiratory: Effort normal.  GI: Soft. There is no tenderness. There is no rebound.  Neurological: She is alert and oriented to person, place, and time.  Skin: Skin is warm and dry.  Psychiatric: She has a normal mood and affect.      Fetal Exam Fetal Monitor Review: Mode: ultrasound.   Baseline rate: 163.         Assessment:    Pregnancy: G1P0 Patient Active Problem List   Diagnosis Date Noted  . Supervision of normal first pregnancy, antepartum 02/20/2017  . Dry skin dermatitis 02/15/2013  . Candidiasis of vulva and vagina 02/15/2013  . Dysmenorrhea 02/15/2013  . Anemia 02/15/2013  . Irregular menstrual cycle 02/15/2013       Plan:     Initial labs drawn. Plan for PP Prenatal vitamins. Problem list reviewed and updated. FIRST Screen: desires AFP3 discussed: requested. Role of ultrasound in pregnancy discussed; fetal survey: requested. Amniocentesis discussed: not indicated. Follow up in 4 weeks. 50% of 30 min visit spent on counseling and coordination of care.      Thressa ShellerHeather Naileah Karg 02/20/2017

## 2017-02-20 NOTE — Progress Notes (Signed)
Pt declined Flu shot,offered Baby Scripts Pt states already has a app that she's using.

## 2017-02-23 ENCOUNTER — Encounter (HOSPITAL_COMMUNITY): Payer: Self-pay | Admitting: Advanced Practice Midwife

## 2017-02-23 LAB — URINE CULTURE, OB REFLEX

## 2017-02-23 LAB — CULTURE, OB URINE

## 2017-02-24 ENCOUNTER — Telehealth: Payer: Self-pay | Admitting: *Deleted

## 2017-02-24 ENCOUNTER — Other Ambulatory Visit: Payer: Self-pay | Admitting: Advanced Practice Midwife

## 2017-02-24 MED ORDER — NITROFURANTOIN MONOHYD MACRO 100 MG PO CAPS: 100.0000 mg | ORAL_CAPSULE | Freq: Two times a day (BID) | ORAL | 0 refills | Status: DC

## 2017-02-24 NOTE — Telephone Encounter (Signed)
-----   Message from Armando ReichertHeather D Hogan, CNM sent at 02/24/2017  8:12 AM EST ----- Pt with UTI, please call. I have sent in rx to pharmacy on file.

## 2017-02-24 NOTE — Telephone Encounter (Signed)
Called pt and informed her of +UTI. Rx has been sent to her [facility] pharmacy.  Pt's questions were answered and she voiced understanding of all information given.

## 2017-02-28 ENCOUNTER — Other Ambulatory Visit: Payer: Self-pay | Admitting: Advanced Practice Midwife

## 2017-02-28 DIAGNOSIS — Z3400 Encounter for supervision of normal first pregnancy, unspecified trimester: Secondary | ICD-10-CM

## 2017-02-28 LAB — CYSTIC FIBROSIS GENE TEST

## 2017-02-28 LAB — OBSTETRIC PANEL, INCLUDING HIV
Antibody Screen: NEGATIVE
BASOS ABS: 0 10*3/uL (ref 0.0–0.2)
Basos: 0 %
EOS (ABSOLUTE): 0.1 10*3/uL (ref 0.0–0.4)
Eos: 1 %
HIV SCREEN 4TH GENERATION: NONREACTIVE
Hematocrit: 35.7 % (ref 34.0–46.6)
Hemoglobin: 12.3 g/dL (ref 11.1–15.9)
Hepatitis B Surface Ag: NEGATIVE
Immature Grans (Abs): 0 10*3/uL (ref 0.0–0.1)
Immature Granulocytes: 0 %
LYMPHS ABS: 2.1 10*3/uL (ref 0.7–3.1)
Lymphs: 19 %
MCH: 28.5 pg (ref 26.6–33.0)
MCHC: 34.5 g/dL (ref 31.5–35.7)
MCV: 83 fL (ref 79–97)
MONOCYTES: 5 %
Monocytes Absolute: 0.6 10*3/uL (ref 0.1–0.9)
NEUTROS ABS: 8.2 10*3/uL — AB (ref 1.4–7.0)
NEUTROS PCT: 75 %
Platelets: 150 10*3/uL (ref 150–379)
RBC: 4.31 x10E6/uL (ref 3.77–5.28)
RDW: 17.7 % — AB (ref 12.3–15.4)
RPR Ser Ql: NONREACTIVE
Rh Factor: NEGATIVE
Rubella Antibodies, IGG: 1.02 index (ref >0.99)
WBC: 11 10*3/uL — AB (ref 3.4–10.8)

## 2017-02-28 LAB — SMN1 COPY NUMBER ANALYSIS (SMA CARRIER SCREENING)

## 2017-02-28 LAB — HEMOGLOBINOPATHY EVALUATION
FERRITIN: 13 ng/mL — AB (ref 15–150)
HGB SOLUBILITY: NEGATIVE
Hgb A2 Quant: 2.1 % (ref 1.8–3.2)
Hgb A: 97.9 % (ref 96.4–98.8)
Hgb C: 0 %
Hgb F Quant: 0 % (ref 0.0–2.0)
Hgb S: 0 %
Hgb Variant: 0 %

## 2017-03-01 ENCOUNTER — Encounter: Payer: Self-pay | Admitting: *Deleted

## 2017-03-03 ENCOUNTER — Encounter (HOSPITAL_COMMUNITY): Payer: Self-pay

## 2017-03-03 ENCOUNTER — Ambulatory Visit (HOSPITAL_COMMUNITY)
Admission: RE | Admit: 2017-03-03 | Discharge: 2017-03-03 | Disposition: A | Discharge status: Home / Self Care | Payer: Medicaid Other | Source: Ambulatory Visit | Attending: Advanced Practice Midwife | Admitting: Advanced Practice Midwife

## 2017-03-03 DIAGNOSIS — Z3682 Encounter for antenatal screening for nuchal translucency: Secondary | ICD-10-CM | POA: Insufficient documentation

## 2017-03-03 DIAGNOSIS — Z3400 Encounter for supervision of normal first pregnancy, unspecified trimester: Secondary | ICD-10-CM

## 2017-03-06 ENCOUNTER — Other Ambulatory Visit: Payer: Self-pay | Admitting: Advanced Practice Midwife

## 2017-03-06 DIAGNOSIS — Z3400 Encounter for supervision of normal first pregnancy, unspecified trimester: Secondary | ICD-10-CM

## 2017-03-08 ENCOUNTER — Other Ambulatory Visit: Payer: Self-pay

## 2017-03-14 ENCOUNTER — Encounter: Payer: Self-pay | Admitting: *Deleted

## 2017-03-20 ENCOUNTER — Encounter: Payer: Medicaid Other | Admitting: Advanced Practice Midwife

## 2017-03-21 ENCOUNTER — Encounter: Payer: Self-pay | Admitting: Obstetrics and Gynecology

## 2017-03-21 ENCOUNTER — Ambulatory Visit (INDEPENDENT_AMBULATORY_CARE_PROVIDER_SITE_OTHER): Payer: Medicaid Other | Admitting: Obstetrics and Gynecology

## 2017-03-21 VITALS — BP 113/88 | HR 112 | Wt 141.0 lb

## 2017-03-21 DIAGNOSIS — Z3400 Encounter for supervision of normal first pregnancy, unspecified trimester: Secondary | ICD-10-CM

## 2017-03-21 DIAGNOSIS — N3000 Acute cystitis without hematuria: Secondary | ICD-10-CM

## 2017-03-21 NOTE — Patient Instructions (Signed)
Second Trimester of Pregnancy The second trimester is from week 13 through week 28, month 4 through 6. This is often the time in pregnancy that you feel your best. Often times, morning sickness has lessened or quit. You may have more energy, and you may get hungry more often. Your unborn baby (fetus) is growing rapidly. At the end of the sixth month, he or she is about 9 inches long and weighs about 1 pounds. You will likely feel the baby move (quickening) between 18 and 20 weeks of pregnancy. Follow these instructions at home:  Avoid all smoking, herbs, and alcohol. Avoid drugs not approved by your doctor.  Do not use any tobacco products, including cigarettes, chewing tobacco, and electronic cigarettes. If you need help quitting, ask your doctor. You may get counseling or other support to help you quit.  Only take medicine as told by your doctor. Some medicines are safe and some are not during pregnancy.  Exercise only as told by your doctor. Stop exercising if you start having cramps.  Eat regular, healthy meals.  Wear a good support bra if your breasts are tender.  Do not use hot tubs, steam rooms, or saunas.  Wear your seat belt when driving.  Avoid raw meat, uncooked cheese, and liter boxes and soil used by cats.  Take your prenatal vitamins.  Take 1500-2000 milligrams of calcium daily starting at the 20th week of pregnancy until you deliver your baby.  Try taking medicine that helps you poop (stool softener) as needed, and if your doctor approves. Eat more fiber by eating fresh fruit, vegetables, and whole grains. Drink enough fluids to keep your pee (urine) clear or pale yellow.  Take warm water baths (sitz baths) to soothe pain or discomfort caused by hemorrhoids. Use hemorrhoid cream if your doctor approves.  If you have puffy, bulging veins (varicose veins), wear support hose. Raise (elevate) your feet for 15 minutes, 3-4 times a day. Limit salt in your diet.  Avoid heavy  lifting, wear low heals, and sit up straight.  Rest with your legs raised if you have leg cramps or low back pain.  Visit your dentist if you have not gone during your pregnancy. Use a soft toothbrush to brush your teeth. Be gentle when you floss.  You can have sex (intercourse) unless your doctor tells you not to.  Go to your doctor visits. Get help if:  You feel dizzy.  You have mild cramps or pressure in your lower belly (abdomen).  You have a nagging pain in your belly area.  You continue to feel sick to your stomach (nauseous), throw up (vomit), or have watery poop (diarrhea).  You have bad smelling fluid coming from your vagina.  You have pain with peeing (urination). Get help right away if:  You have a fever.  You are leaking fluid from your vagina.  You have spotting or bleeding from your vagina.  You have severe belly cramping or pain.  You lose or gain weight rapidly.  You have trouble catching your breath and have chest pain.  You notice sudden or extreme puffiness (swelling) of your face, hands, ankles, feet, or legs.  You have not felt the baby move in over an hour.  You have severe headaches that do not go away with medicine.  You have vision changes. This information is not intended to replace advice given to you by your health care provider. Make sure you discuss any questions you have with your health care   provider. Document Released: 04/13/2009 Document Revised: 06/25/2015 Document Reviewed: 03/20/2012 Elsevier Interactive Patient Education  2017 [employer]  

## 2017-03-21 NOTE — Progress Notes (Signed)
Subjective:  Sherry Leach is a 22 y.o. G1P0 at 8176w5d being seen today for ongoing prenatal care.  She is currently monitored for the following issues for this low-risk pregnancy and has Dry skin dermatitis; Dysmenorrhea; Anemia; Irregular menstrual cycle; and Supervision of normal first pregnancy, antepartum on their problem list.  Patient reports no complaints.  Contractions: Not present. Vag. Bleeding: None.  Movement: Absent. Denies leaking of fluid.   The following portions of the patient's history were reviewed and updated as appropriate: allergies, current medications, past family history, past medical history, past social history, past surgical history and problem list. Problem list updated.  Objective:   Vitals:   03/21/17 1442  BP: 113/88  Pulse: (!) 112  Weight: 141 lb (64 kg)    Fetal Status: Fetal Heart Rate (bpm): 158   Movement: Absent     General:  Alert, oriented and cooperative. Patient is in no acute distress.  Skin: Skin is warm and dry. No rash noted.   Cardiovascular: Normal heart rate noted  Respiratory: Normal respiratory effort, no problems with respiration noted  Abdomen: Soft, gravid, appropriate for gestational age. Pain/Pressure: Absent     Pelvic: Vag. Bleeding: None     Cervical exam deferred        Extremities: Normal range of motion.  Edema: None  Mental Status: Normal mood and affect. Normal behavior. Normal judgment and thought content.   Urinalysis:      Assessment and Plan:  Pregnancy: G1P0 at 5976w5d  1. Supervision of normal first pregnancy, antepartum Doing well. Reviewed initial OB labs which were unremarkable. Anatomy US ordered - US MFM OB COMP + 14 WK; Future - Culture, OB Urine  2. Acute cystitis without hematuria Repeat Urine culture today due to UTI at last visit. Patient completed therapy. Asymptomatic.   General obstetric precautions including but not limited to vaginal bleeding, leaking of fluid and fetal movement were reviewed  in detail with the patient. Please refer to After Visit Summary for other counseling recommendations.  Return in about 4 weeks (around 04/18/2017) for ob visit.  Pincus LargePhelps, Rosmery Duggin Y, DO

## 2017-03-23 LAB — URINE CULTURE, OB REFLEX

## 2017-03-23 LAB — CULTURE, OB URINE

## 2017-04-10 ENCOUNTER — Encounter: Payer: Self-pay | Admitting: Advanced Practice Midwife

## 2017-04-10 ENCOUNTER — Ambulatory Visit (INDEPENDENT_AMBULATORY_CARE_PROVIDER_SITE_OTHER): Payer: Medicaid Other | Admitting: Advanced Practice Midwife

## 2017-04-10 VITALS — BP 122/68 | HR 74 | Wt 149.5 lb

## 2017-04-10 DIAGNOSIS — Z3402 Encounter for supervision of normal first pregnancy, second trimester: Secondary | ICD-10-CM

## 2017-04-10 DIAGNOSIS — Z3400 Encounter for supervision of normal first pregnancy, unspecified trimester: Secondary | ICD-10-CM

## 2017-04-10 NOTE — Progress Notes (Signed)
   PRENATAL VISIT NOTE  Subjective:  Sherry Leach is a 22 y.o. G1P0 at 7224w4d being seen today for ongoing prenatal care.  She is currently monitored for the following issues for this low-risk pregnancy and has Dry skin dermatitis; Dysmenorrhea; Anemia; Irregular menstrual cycle; and Supervision of normal first pregnancy, antepartum on their problem list.  Patient reports no complaints.  Contractions: Not present. Vag. Bleeding: None.  Movement: Present. Denies leaking of fluid.   The following portions of the patient's history were reviewed and updated as appropriate: allergies, current medications, past family history, past medical history, past social history, past surgical history and problem list. Problem list updated.  Objective:   Vitals:   04/10/17 1520  BP: 122/68  Pulse: 74  Weight: 149 lb 8 oz (67.8 kg)    Fetal Status: Fetal Heart Rate (bpm): 143 Fundal Height: 18 cm Movement: Present     General:  Alert, oriented and cooperative. Patient is in no acute distress.  Skin: Skin is warm and dry. No rash noted.   Cardiovascular: Normal heart rate noted  Respiratory: Normal respiratory effort, no problems with respiration noted  Abdomen: Soft, gravid, appropriate for gestational age.  Pain/Pressure: Absent     Pelvic: Cervical exam deferred        Extremities: Normal range of motion.  Edema: None  Mental Status:  Normal mood and affect. Normal behavior. Normal judgment and thought content.   Assessment and Plan:  Pregnancy: G1P0 at 1224w4d  1. Supervision of normal first pregnancy, antepartum - AFP, Serum, Open Spina Bifida - Anatomy US scheduled   Preterm labor symptoms and general obstetric precautions including but not limited to vaginal bleeding, contractions, leaking of fluid and fetal movement were reviewed in detail with the patient. Please refer to After Visit Summary for other counseling recommendations.  Return in about 4 weeks (around 05/08/2017).   Thressa ShellerHeather  Clent Damore, CNM

## 2017-04-10 NOTE — Patient Instructions (Signed)
Childbirth Education Options: [facility] Department Classes:  Childbirth education classes can help you get ready for a positive parenting experience. You can also meet other expectant parents and get free stuff for your baby. Each class runs for five weeks on the same night and costs $45 for the mother-to-be and her support person. Medicaid covers the cost if you are eligible. Call [phone] to register. [facility] Childbirth Education:  [phone] or [phone] or [email or web address]  Baby & Me Class: Discuss newborn & infant parenting and family adjustment issues with other new mothers in a relaxed environment. Each week brings a new speaker or baby-centered activity. We encourage new mothers to join [place] every Thursday at 11:00am. Babies birth until crawling. No registration or fee. Daddy [employer]: This course offers Dads-to-be the tools and knowledge needed to feel confident on their journey to becoming new fathers. Experienced dads, who have been trained as coaches, teach dads-to-be how to hold, comfort, diaper, swaddle and play with their infant while being able to support the new mom as well. A class for men taught by men. $25/dad Big Brother/Big Sister: Let your children share in the joy of a new brother or sister in this special class designed just for them. Class includes discussion about how families care for babies: swaddling, holding, diapering, safety as well as how they can be helpful in their new role. This class is designed for children ages 45 to 48, but any age is welcome. Please register each child individually. $5/child  Mom Talk: This mom-led group offers support and connection to mothers as they journey through the adjustments and struggles of that sometimes overwhelming first year after the birth of a child. Tuesdays at 10:00am and Thursdays at 6:00pm. Babies welcome. No registration or fee. Breastfeeding Support Group: This group is a mother-to-mother  support circle where moms have the opportunity to share their breastfeeding experiences. A Lactation Consultant is present for questions and concerns. Meets each Tuesday at 11:00am. No fee or registration. Breastfeeding Your Baby: Learn what to expect in the first days of breastfeeding your newborn.  This class will help you feel more confident with the skills needed to begin your breastfeeding experience. Many new mothers are concerned about breastfeeding after leaving the hospital. This class will also address the most common fears and challenges about breastfeeding during the first few weeks, months and beyond. (call for fee) Comfort Techniques and Tour: This 2 hour interactive class will provide you the opportunity to learn & practice hands-on techniques that can help relieve some of the discomfort of labor and encourage your baby to rotate toward the best position for birth. You and your partner will be able to try a variety of labor positions with birth balls and rebozos as well as practice breathing, relaxation, and visualization techniques. A tour of the [facility] is included with this class. $20 per registrant and support person Childbirth Class- Weekend Option: This class is a Weekend version of our Birth & Baby series. It is designed for parents who have a difficult time fitting several weeks of classes into their schedule. It covers the care of your newborn and the basics of labor and childbirth. It also includes a [place] of [facility] and lunch. The class is held two consecutive days: beginning on Friday evening from 6:30 - 8:30 p.m. and the next day, Saturday from 9 a.m. - 4 p.m. (call for fee) Doren Custard Class: Interested in a waterbirth?  This  informational class will help you discover whether waterbirth is the right fit for you. Education about waterbirth itself, supplies you would need and how to assemble your support team is what you can  expect from this class. Some obstetrical practices require this class in order to pursue a waterbirth. (Not all obstetrical practices offer waterbirth-check with your healthcare provider.) Register only the expectant mom, but you are encouraged to bring your partner to class! Required if planning waterbirth, no fee. Infant/Child CPR: Parents, grandparents, babysitters, and friends learn Cardio-Pulmonary Resuscitation skills for infants and children. You will also learn how to treat both conscious and unconscious choking in infants and children. This Family & Friends program does not offer certification. Register each participant individually to ensure that enough mannequins are available. (Call for fee) Grandparent Love: Expecting a grandbaby? This class is for you! Learn about the latest infant care and safety recommendations and ways to support your own child as he or she transitions into the parenting role. Taught by Registered Nurses who are childbirth instructors, but most importantly...they are grandmothers too! $10/person. Childbirth Class- Natural Childbirth: This series of 5 weekly classes is for expectant parents who want to learn and practice natural methods of coping with the process of labor and childbirth. Relaxation, breathing, massage, visualization, role of the partner, and helpful positioning are highlighted. Participants learn how to be confident in their body's ability to give birth. This class will empower and help parents make informed decisions about their own care. Includes discussion that will help new parents transition into the immediate postpartum period. Maternity Care Center Tour of [facility] is included. We suggest taking this class between 25-32 weeks, but it's only a recommendation. $75 per registrant and one support person or $30 Medicaid. Childbirth Class- 3 week Series: This option of 3 weekly classes helps you and your labor partner prepare for childbirth. Newborn  care, labor & birth, cesarean birth, pain management, and comfort techniques are discussed and a Maternity Care Center Tour of [facility] is included. The class meets at the same time, on the same day of the week for 3 consecutive weeks beginning with the starting date you choose. $60 for registrant and one support person.  Marvelous Multiples: Expecting twins, triplets, or more? This class covers the differences in labor, birth, parenting, and breastfeeding issues that face multiples' parents. NICU tour is included. Led by a Certified Childbirth Educator who is the mother of twins. No fee. Caring for Baby: This class is for expectant and adoptive parents who want to learn and practice the most up-to-date newborn care for their babies. Focus is on birth through the first six weeks of life. Topics include feeding, bathing, diapering, crying, umbilical cord care, circumcision care and safe sleep. Parents learn to recognize symptoms of illness and when to call the pediatrician. Register only the mom-to-be and your partner or support person can plan to come with you! $10 per registrant and support person Childbirth Class- online option: This online class offers you the freedom to complete a Birth and Baby series in the comfort of your own home. The flexibility of this option allows you to review sections at your own pace, at times convenient to you and your support people. It includes additional video information, animations, quizzes, and extended activities. Get organized with helpful eClass tools, checklists, and trackers. Once you register online for the class, you will receive an email within a few days to accept the invitation and begin the class when the time   is right for you. The content will be available to you for 60 days. $60 for 60 days of online access for you and your support people.  Local Doulas: Natural Baby Doulas [email or web address] Tel:  [phone] [email or web address] [employer] [phone] [email or web address] The Labor Hassell Halim  (also do waterbirth tub rental) [phone] [email or web address] [facility]ad Birth Doula [phone] [email or web address] NotebookDistributors.fi Sacred Rhythms  [phone] https://[email or web address]-[email or web address]/ [employer] (PADA) [email or web address].[email or web address]/ [place] Birth and Baby  [email or web address]/ Considering Waterbirth? Guide for patients at [facility] [employer]  Why consider waterbirth?  . Gentle birth for babies . Less pain medicine used in labor . May allow for passive descent/less pushing . May reduce perineal tears  . More mobility and instinctive maternal position changes . Increased maternal relaxation . Reduced blood pressure in labor  Is waterbirth safe? What are the risks of infection, drowning or other complications?  . Infection: o Very low risk (3.7 % for tub vs 4.8% for bed) o 7 in 8000 waterbirths with documented infection o Poorly cleaned equipment most common cause o Slightly lower group B strep transmission rate  . Drowning o Maternal:  - Very low risk   - Related to seizures or fainting o Newborn:  - Very low risk. No evidence of increased risk of respiratory problems in multiple large studies - Physiological protection from breathing under water - Avoid underwater birth if there are any fetal complications - Once baby's head is out of the water, keep it out.  . Birth complication o Some reports of cord trauma, but risk decreased by bringing baby to surface gradually o No evidence of increased risk of shoulder dystocia. Mothers can usually change positions faster in water than in a bed, possibly aiding the maneuvers to free the shoulder.   You must attend a Doren Custard class at [facility]  3rd Wednesday of every month from 7-9pm  [employer] by calling [phone] or online at [email or web address]  Bring [place] the certificate from the class to your prenatal appointment  Meet with a midwife at 36 weeks to see if you can still plan a waterbirth and to sign the consent.   Purchase or rent the following supplies:   Water Birth Pool (Birth Pool in a Box or [place] for instance)  (Tubs start ~$125)  Single-use disposable tub liner designed for your brand of tub  New garden hose labeled "lead-free", "suitable for drinking water",  Electric drain pump to remove water (We recommend 792 gallon per hour or greater pump.)   Separate garden hose to remove the dirty water  Fish net  Bathing suit top (optional)  Long-handled mirror (optional)  Places to purchase or rent supplies  GotWebTools.is for tub purchases and supplies  Waterbirthsolutions.com for tub purchases and supplies  The Labor Ladies (www.[email or web address]) $275 for tub rental/set-up & take down/kit   [employer] (http://www.fleming.com/.htm) Information regarding doulas (labor support) who provide pool rentals  Our practice has a Birth Pool in a Box tub at the hospital that you may borrow on a first-come-first-served basis. It is your responsibility to to set up, clean and break down the tub. We cannot guarantee the availability of this tub in advance. You are responsible for bringing all accessories listed above. If you do not have all necessary supplies you cannot have a waterbirth.    Things that would prevent you from having a waterbirth:  Premature, <37wks  Previous cesarean birth  Presence of thick meconium-stained fluid  Multiple gestation (Twins,  triplets, etc.)  Uncontrolled diabetes or gestational diabetes requiring medication  Hypertension requiring medication or diagnosis of pre-eclampsia  Heavy vaginal bleeding  Non-reassuring fetal  heart rate  Active infection (MRSA, etc.). Group B Strep is NOT a contraindication for  waterbirth.  If your labor has to be induced and induction method requires continuous  monitoring of the baby's heart rate  Other risks/issues identified by your obstetrical provider  Please remember that birth is unpredictable. Under certain unforeseeable circumstances your provider may advise against giving birth in the tub. These decisions will be made on a case-by-case basis and with the safety of you and your baby as our highest priority.   AREA PEDIATRIC/FAMILY PRACTICE PHYSICIANS  [facility] [address] [place], [place]  [address] Phone - [phone]   Fax - [phone]  [facility] [address] [place], [place] [address] Phone - [phone]   Fax - [phone]  JACK AMOS [address] [place], [place]  [address] Phone - [phone]   Fax - [phone]  [facility] [address] [place], [place]  [address] Phone - [phone]   Fax - [phone]  [facility] [address] [place], [place]  [address] Phone - [phone]   Fax - [phone]  [facility] [address] [place], [place]  [address] Phone - [phone]   Fax - [phone]  [facility] [address] [place], [place]  [address] Phone - [phone]   Fax - [phone]  [facility] [address] [place], [place]  [address] Phone - [phone]   Fax - [phone]  [facility] [address] [place], [place]  [address] Phone - [phone]   Fax - [phone] [facility] [address] [place], [place]  [address] Phone - [phone]   Fax - [phone]  [facility] [address] [place], [place]  [address] Phone -  [phone]   Fax - [phone]  [facility] [address] [place], [place]  [address] Phone - [phone]   Fax - [phone]  [facility] [address] [place], [place]  [address] Phone - [phone]   Fax - [phone]  SHILPA GOSRANI [address] [place], [place]  [address] Phone - [phone]  [facility] [address] [place], [place]  [address] Phone - [phone]   Fax - [phone]  [facility] [address] [place], [place]  [address] Phone - [phone]   Fax - [phone]  [facility] [address] [place], [place]  [address] Phone - [phone]   Fax - [phone]  [facility] [address] [place], [place]  [address] Phone - [phone]   Fax - [phone]   [facility] [address] [place], [place]  [address] Phone - [phone]   Fax - [phone]  [facility] [address] [place], [place]  [address] Phone - [phone]   Fax - [phone]  DAVID RUBIN [address] , [place]  [address] Phone - [phone]   Fax - [phone]  [facility] [address] ,   [address] Phone - [phone]   Fax - [phone]  [facility] [address]   [place], [place]  [address] Phone - [phone]   Fax - [place] [place], [place]  [address] Phone - [phone]   Fax - [place] [address] [place], [place]  [address] Phone - [phone]   Fax - [place] [address] [place], [place]  [address] Phone - [phone]   Fax - [phone]  Hyde MD [address] [place] [address] Phone [phone]  Fax [phone]  Safe Medications in Pregnancy   Acne: Benzoyl Peroxide Salicylic Acid  Backache/Headache: Tylenol: 2 regular strength every 4 hours OR              2 Extra strength every 6 hours  Colds/Coughs/Allergies: Benadryl (alcohol free) 25 mg every 6 hours as needed Breath right strips Claritin Cepacol throat lozenges Chloraseptic throat spray Cold-Eeze- up to three times per day Cough drops, alcohol free Flonase (by prescription only) Guaifenesin Mucinex Robitussin DM (plain only, alcohol free) Saline nasal spray/drops Sudafed (pseudoephedrine) & Actifed ** use only after [redacted] weeks gestation and if you do not have high blood pressure Tylenol Vicks Vaporub Zinc lozenges Zyrtec   Constipation: Colace Ducolax suppositories Fleet enema Glycerin suppositories Metamucil Milk of magnesia Miralax Senokot Smooth move tea  Diarrhea: Kaopectate Imodium A-D  *NO pepto Bismol  Hemorrhoids: Anusol Anusol HC Preparation H Tucks  Indigestion: Tums Maalox Mylanta Zantac  Pepcid  Insomnia: Benadryl (alcohol free) 65m every 6 hours as needed Tylenol PM Unisom, no Gelcaps  Leg Cramps: Tums MagGel  Nausea/Vomiting:  Bonine Dramamine Emetrol Ginger extract Sea bands Meclizine  Nausea medication to take during pregnancy:  Unisom (doxylamine succinate 25 mg tablets) Take one tablet daily at bedtime. If symptoms are not adequately controlled, the dose can be increased to a maximum recommended dose of two tablets daily (1/2 tablet in the morning, 1/2 tablet mid-afternoon and one at bedtime). Vitamin B6 106mtablets. Take one tablet twice a day (up to 200 mg per day).  Skin Rashes: Aveeno products Benadryl cream or 2565mvery 6 hours as needed Calamine Lotion 1% cortisone cream  Yeast infection: Gyne-lotrimin 7 Monistat 7   **If taking multiple medications, please check labels to avoid  duplicating the same active ingredients **take medication as directed on the label ** Do not exceed 4000 mg of tylenol in 24 hours **Do not take medications that contain aspirin or ibuprofen

## 2017-04-12 ENCOUNTER — Other Ambulatory Visit: Payer: Self-pay | Admitting: Advanced Practice Midwife

## 2017-04-12 DIAGNOSIS — Z3400 Encounter for supervision of normal first pregnancy, unspecified trimester: Secondary | ICD-10-CM

## 2017-04-12 LAB — AFP, SERUM, OPEN SPINA BIFIDA
AFP MoM: 0.74
AFP Value: 33.4 ng/mL
GEST. AGE ON COLLECTION DATE: 18.4 wk
Maternal Age At EDD: 21.5 yr
OSBR RISK 1 IN: 10000
TEST RESULTS AFP: NEGATIVE
Weight: 149 [lb_av]

## 2017-04-13 ENCOUNTER — Other Ambulatory Visit: Payer: Self-pay | Admitting: Obstetrics and Gynecology

## 2017-04-13 ENCOUNTER — Ambulatory Visit (HOSPITAL_COMMUNITY)
Admission: RE | Admit: 2017-04-13 | Discharge: 2017-04-13 | Disposition: A | Discharge status: Home / Self Care | Payer: Medicaid Other | Source: Ambulatory Visit | Attending: Obstetrics and Gynecology | Admitting: Obstetrics and Gynecology

## 2017-04-13 DIAGNOSIS — Z363 Encounter for antenatal screening for malformations: Secondary | ICD-10-CM | POA: Diagnosis not present

## 2017-04-13 DIAGNOSIS — Z3400 Encounter for supervision of normal first pregnancy, unspecified trimester: Secondary | ICD-10-CM

## 2017-04-13 DIAGNOSIS — Z3A19 19 weeks gestation of pregnancy: Secondary | ICD-10-CM

## 2017-04-16 ENCOUNTER — Inpatient Hospital Stay (HOSPITAL_COMMUNITY)
Admission: AD | Admit: 2017-04-16 | Discharge: 2017-04-16 | Disposition: A | Discharge status: Home / Self Care | Payer: Medicaid Other | Source: Ambulatory Visit | Attending: Obstetrics & Gynecology | Admitting: Obstetrics & Gynecology

## 2017-04-16 ENCOUNTER — Encounter (HOSPITAL_COMMUNITY): Payer: Self-pay

## 2017-04-16 DIAGNOSIS — Z8249 Family history of ischemic heart disease and other diseases of the circulatory system: Secondary | ICD-10-CM | POA: Diagnosis not present

## 2017-04-16 DIAGNOSIS — Z3A19 19 weeks gestation of pregnancy: Secondary | ICD-10-CM

## 2017-04-16 DIAGNOSIS — Z880 Allergy status to penicillin: Secondary | ICD-10-CM | POA: Insufficient documentation

## 2017-04-16 DIAGNOSIS — M549 Dorsalgia, unspecified: Secondary | ICD-10-CM | POA: Diagnosis present

## 2017-04-16 DIAGNOSIS — K5900 Constipation, unspecified: Secondary | ICD-10-CM | POA: Insufficient documentation

## 2017-04-16 DIAGNOSIS — O2342 Unspecified infection of urinary tract in pregnancy, second trimester: Secondary | ICD-10-CM | POA: Diagnosis not present

## 2017-04-16 DIAGNOSIS — Z9889 Other specified postprocedural states: Secondary | ICD-10-CM | POA: Insufficient documentation

## 2017-04-16 DIAGNOSIS — O99612 Diseases of the digestive system complicating pregnancy, second trimester: Secondary | ICD-10-CM | POA: Diagnosis not present

## 2017-04-16 DIAGNOSIS — K5901 Slow transit constipation: Secondary | ICD-10-CM

## 2017-04-16 DIAGNOSIS — B9620 Unspecified Escherichia coli [E. coli] as the cause of diseases classified elsewhere: Secondary | ICD-10-CM | POA: Insufficient documentation

## 2017-04-16 DIAGNOSIS — Z1620 Resistance to unspecified antibiotic: Secondary | ICD-10-CM | POA: Diagnosis not present

## 2017-04-16 DIAGNOSIS — Z3400 Encounter for supervision of normal first pregnancy, unspecified trimester: Secondary | ICD-10-CM

## 2017-04-16 DIAGNOSIS — Z87891 Personal history of nicotine dependence: Secondary | ICD-10-CM | POA: Insufficient documentation

## 2017-04-16 DIAGNOSIS — R3915 Urgency of urination: Secondary | ICD-10-CM | POA: Diagnosis present

## 2017-04-16 LAB — URINALYSIS, ROUTINE W REFLEX MICROSCOPIC
BILIRUBIN URINE: NEGATIVE
GLUCOSE, UA: NEGATIVE mg/dL
Ketones, ur: NEGATIVE mg/dL
NITRITE: POSITIVE — AB
PH: 7 (ref 5.0–8.0)
Protein, ur: 30 mg/dL — AB
SPECIFIC GRAVITY, URINE: 1.015 (ref 1.005–1.030)

## 2017-04-16 MED ORDER — POLYETHYLENE GLYCOL 3350 17 GM/SCOOP PO POWD: 17.0000 g | Freq: Every day | ORAL | 0 refills | Status: DC

## 2017-04-16 MED ORDER — NITROFURANTOIN MONOHYD MACRO 100 MG PO CAPS: 100.0000 mg | ORAL_CAPSULE | Freq: Two times a day (BID) | ORAL | 0 refills | Status: DC

## 2017-04-16 NOTE — Discharge Instructions (Signed)
Constipation, Adult Constipation is when a person:  Poops (has a bowel movement) fewer times in a week than normal.  Has a hard time pooping.  Has poop that is dry, hard, or bigger than normal.  Follow these instructions at home: Eating and drinking   Eat foods that have a lot of fiber, such as: ? Fresh fruits and vegetables. ? Whole grains. ? Beans.  Eat less of foods that are high in fat, low in fiber, or overly processed, such as: ? [place] fries. ? Hamburgers. ? Cookies. ? Candy. ? Soda.  Drink enough fluid to keep your pee (urine) clear or pale yellow. General instructions  Exercise regularly or as told by your doctor.  Go to the restroom when you feel like you need to poop. Do not hold it in.  Take over-the-counter and prescription medicines only as told by your doctor. These include any fiber supplements.  Do pelvic floor retraining exercises, such as: ? Doing deep breathing while relaxing your lower belly (abdomen). ? Relaxing your pelvic floor while pooping.  Watch your condition for any changes.  Keep all follow-up visits as told by your doctor. This is important. Contact a doctor if:  You have pain that gets worse.  You have a fever.  You have not pooped for 4 days.  You throw up (vomit).  You are not hungry.  You lose weight.  You are bleeding from the anus.  You have thin, pencil-like poop (stool). Get help right away if:  You have a fever, and your symptoms suddenly get worse.  You leak poop or have blood in your poop.  Your belly feels hard or bigger than normal (is bloated).  You have very bad belly pain.  You feel dizzy or you faint. This information is not intended to replace advice given to you by your health care provider. Make sure you discuss any questions you have with your health care provider. Document Released: 07/06/2007 Document Revised: 08/07/2015 Document Reviewed: 07/08/2015 Elsevier Interactive Patient Education   2018 Elsevier Inc. Pregnancy and Urinary Tract Infection What is a urinary tract infection? A urinary tract infection (UTI) is an infection of any part of the urinary tract. This includes the kidneys, the tubes that connect your kidneys to your bladder (ureters), the bladder, and the tube that carries urine out of your body (urethra). These organs make, store, and get rid of urine in the body. A UTI can be a bladder infection (cystitis) or a kidney infection (pyelonephritis). This infection may be caused by fungi, viruses, and bacteria. Bacteria are the most common cause of UTIs. You are more likely to develop a UTI during pregnancy because:  The physical and hormonal changes your body goes through can make it easier for bacteria to get into your urinary tract.  Your growing baby puts pressure on your uterus and can affect urine flow.  Does a UTI place my baby at risk? An untreated UTI during pregnancy could lead to a kidney infection, which can cause health problems that could affect your baby. Possible complications of an untreated UTI include:  Having your baby before 37 weeks of pregnancy (premature).  Having a baby with a low birth weight.  Developing high blood pressure during pregnancy (preeclampsia).  What are the symptoms of a UTI? Symptoms of a UTI include:  Fever.  Frequent urination or passing small amounts of urine frequently.  Needing to urinate urgently.  Pain or a burning sensation with urination.  Urine that smells  bad or unusual.  Cloudy urine.  Pain in the lower abdomen or back.  Trouble urinating.  Blood in the urine.  Vomiting or being less hungry than normal.  Diarrhea or abdominal pain.  Vaginal discharge.  What are the treatment options for a UTI during pregnancy? Treatment for this condition may include:  Antibiotic medicines that are safe to take during pregnancy.  Other medicines to treat less common causes of UTI.  How can I prevent a  UTI?  To prevent a UTI:  Go to the bathroom as soon as you feel the need.  Always wipe from front to back.  Wash your genital area with soap and warm water daily.  Empty your bladder before and after sex.  Wear cotton underwear.  Limit your intake of high sugar foods or drinks, such as regular soda, juice, and sweets.  Drink 6-8 glasses of water daily.  Do not wear tight-fitting pants.  Do not douche or use deodorant sprays.  Do not drink alcohol, caffeine, or carbonated drinks. These can irritate the bladder.  Contact a health care provider if:  Your symptoms do not improve or get worse.  You have a fever after two days of treatment.  You have a rash.  You have abnormal vaginal discharge.  You have back or side pain.  You have chills.  You have nausea and vomiting. Get help right away if: Seek immediate medical care if you are pregnant and:  You feel contractions in your uterus.  You have lower belly pain.  You have a gush of fluid from your vagina.  You have blood in your urine.  You are vomiting and cannot keep down any medicines or water.  This information is not intended to replace advice given to you by your health care provider. Make sure you discuss any questions you have with your health care provider. Document Released: 05/14/2010 Document Revised: 01/01/2016 Document Reviewed: 12/08/2014 Elsevier Interactive Patient Education  2017 [employer]

## 2017-04-16 NOTE — MAU Note (Addendum)
Last BM was on Wednesday or Thursday- has tried colace and warm prune juice  Back pain since yesterday- 8/10, constant, pressure, tried tylenol  Took a nap yesterday afternoon and then was not able to sleep last night, did not try anything  Having urinary urgency, some discomfort with urination

## 2017-04-16 NOTE — MAU Provider Note (Signed)
History     CSN: [id number]  Arrival date and time: 04/16/17 1051   First Provider Initiated Contact with Patient 04/16/17 1125      Chief Complaint  Patient presents with  . Constipation  . Back Pain  . Urinary Urgency  . Trouble Sleeping   G1 @19 .3 wks here with constipation, back pain, and urinary urgency. Constipation and urgency started 4 days ago. Before that she had a hard BM. Had tried Colace and warm prune juice without relief. Feels pressure in rectum. Denies dysuria or hematuria. Back pain started last night. Located lower right back. No recent lifting or strenuous activity. Denies fevers. No N/V. No VB. No abdominal pain.    OB History    Gravida Para Term Preterm AB Living   1         0   SAB TAB Ectopic Multiple Live Births                  Past Medical History:  Diagnosis Date  . Medical history non-contributory     Past Surgical History:  Procedure Laterality Date  . MOUTH SURGERY      Family History  Problem Relation Age of Onset  . Hypertension Mother   . Hypertension Other     Social History   Tobacco Use  . Smoking status: Former Smoker    Packs/day: 0.50  . Smokeless tobacco: Never Used  Substance Use Topics  . Alcohol use: No  . Drug use: No    Allergies:  Allergies  Allergen Reactions  . Amoxicillin Rash    Has patient had a PCN reaction causing immediate rash, facial/tongue/throat swelling, SOB or lightheadedness with hypotension: No Has patient had a PCN reaction causing severe rash involving mucus membranes or skin necrosis: No Has patient had a PCN reaction that required hospitalization: No Has patient had a PCN reaction occurring within the last 10 years: Yes If all of the above answers are "NO", then may proceed with Cephalosporin use.   Marland Kitchen Penicillins Rash    Has patient had a PCN reaction causing immediate rash, facial/tongue/throat swelling, SOB or lightheadedness with hypotension: No Has patient had a PCN reaction  causing severe rash involving mucus membranes or skin necrosis: No Has patient had a PCN reaction that required hospitalization: No Has patient had a PCN reaction occurring within the last 10 years: Yes If all of the above answers are "NO", then may proceed with Cephalosporin use.     No medications prior to admission.    Review of Systems  Constitutional: Negative for chills and fever.  Gastrointestinal: Positive for constipation. Negative for abdominal pain, nausea and vomiting.  Genitourinary: Positive for urgency. Negative for dysuria, frequency, hematuria and vaginal bleeding.  Musculoskeletal: Positive for back pain.   Physical Exam   Blood pressure 113/63, pulse 79, temperature 98.2 F (36.8 C), temperature source Oral, resp. rate 18, height 5\' 8"  (1.727 m), weight 148 lb 12 oz (67.5 kg), last menstrual period 12/01/2016.  Physical Exam  Nursing note and vitals reviewed. Constitutional: She is oriented to person, place, and time. She appears well-developed and well-nourished. No distress.  HENT:  Head: Normocephalic and atraumatic.  Neck: Normal range of motion.  Cardiovascular: Normal rate.  Respiratory: Effort normal. No respiratory distress.  GI: Soft. Bowel sounds are normal. She exhibits no distension and no mass. There is no tenderness. There is no rebound, no guarding and no CVA tenderness.  Musculoskeletal: Normal range of motion.  Cervical back: Normal. She exhibits no tenderness.       Thoracic back: Normal. She exhibits no tenderness.       Lumbar back: She exhibits tenderness (right).  Neurological: She is alert and oriented to person, place, and time.  Skin: Skin is warm and dry.  Psychiatric: She has a normal mood and affect.  FHT 155  Results for orders placed or performed during the hospital encounter of 04/16/17 (from the past 24 hour(s))  Urinalysis, Routine w reflex microscopic     Status: Abnormal   Collection Time: 04/16/17 10:55 AM  Result  Value Ref Range   Color, Urine YELLOW YELLOW   APPearance CLOUDY (A) CLEAR   Specific Gravity, Urine 1.015 1.005 - 1.030   pH 7.0 5.0 - 8.0   Glucose, UA NEGATIVE NEGATIVE mg/dL   Hgb urine dipstick SMALL (A) NEGATIVE   Bilirubin Urine NEGATIVE NEGATIVE   Ketones, ur NEGATIVE NEGATIVE mg/dL   Protein, ur 30 (A) NEGATIVE mg/dL   Nitrite POSITIVE (A) NEGATIVE   Leukocytes, UA MODERATE (A) NEGATIVE   RBC / HPF 6-30 0 - 5 RBC/hpf   WBC, UA TOO NUMEROUS TO COUNT 0 - 5 WBC/hpf   Bacteria, UA MANY (A) NONE SEEN   Squamous Epithelial / LPF 6-30 (A) NONE SEEN   Mucus PRESENT    MAU Course  Procedures  MDM Labs ordered and reviewed. No evidence of pyelo, will treat UTI. UC sent. Offered enema but declined, will try Miralax. Encouraged to increase water and dietary fiber intake. Stable for discharge home.   Assessment and Plan  [redacted] weeks gestation UTI Constipation  Discharge home Rx Macrobid Rx Miralax Follow up in OB office as scheduled  Allergies as of 04/16/2017      Reactions   Amoxicillin Rash   Has patient had a PCN reaction causing immediate rash, facial/tongue/throat swelling, SOB or lightheadedness with hypotension: No Has patient had a PCN reaction causing severe rash involving mucus membranes or skin necrosis: No Has patient had a PCN reaction that required hospitalization: No Has patient had a PCN reaction occurring within the last 10 years: Yes If all of the above answers are "NO", then may proceed with Cephalosporin use.   Penicillins Rash   Has patient had a PCN reaction causing immediate rash, facial/tongue/throat swelling, SOB or lightheadedness with hypotension: No Has patient had a PCN reaction causing severe rash involving mucus membranes or skin necrosis: No Has patient had a PCN reaction that required hospitalization: No Has patient had a PCN reaction occurring within the last 10 years: Yes If all of the above answers are "NO", then may proceed with  Cephalosporin use.      Medication List    TAKE these medications   acetaminophen 325 MG tablet Commonly known as:  TYLENOL Take 325 mg by mouth every 6 (six) hours as needed for mild pain.   nitrofurantoin (macrocrystal-monohydrate) 100 MG capsule Commonly known as:  MACROBID Take 1 capsule (100 mg total) by mouth 2 (two) times daily.   polyethylene glycol powder powder Commonly known as:  GLYCOLAX/MIRALAX Take 17 g by mouth daily.   prenatal multivitamin Tabs tablet Take 1 tablet by mouth daily at 12 noon.      Donette LarryMelanie Bryen Hinderman, CNM 04/16/2017, 1:21 PM

## 2017-04-16 NOTE — MAU Note (Signed)
Urine in lab 

## 2017-04-19 ENCOUNTER — Encounter (HOSPITAL_COMMUNITY): Payer: Self-pay

## 2017-04-19 ENCOUNTER — Other Ambulatory Visit: Payer: Self-pay

## 2017-04-19 ENCOUNTER — Inpatient Hospital Stay (HOSPITAL_COMMUNITY)
Admission: AD | Admit: 2017-04-19 | Discharge: 2017-04-21 | DRG: 833 | Disposition: A | Discharge status: Home / Self Care | Payer: Medicaid Other | Source: Ambulatory Visit | Attending: Obstetrics and Gynecology | Admitting: Obstetrics and Gynecology

## 2017-04-19 DIAGNOSIS — Z87891 Personal history of nicotine dependence: Secondary | ICD-10-CM

## 2017-04-19 DIAGNOSIS — O2300 Infections of kidney in pregnancy, unspecified trimester: Secondary | ICD-10-CM | POA: Diagnosis present

## 2017-04-19 DIAGNOSIS — O2302 Infections of kidney in pregnancy, second trimester: Principal | ICD-10-CM | POA: Diagnosis present

## 2017-04-19 DIAGNOSIS — Z3A19 19 weeks gestation of pregnancy: Secondary | ICD-10-CM

## 2017-04-19 DIAGNOSIS — Z3400 Encounter for supervision of normal first pregnancy, unspecified trimester: Secondary | ICD-10-CM

## 2017-04-19 LAB — COMPREHENSIVE METABOLIC PANEL
ALK PHOS: 93 U/L (ref 38–126)
ALT: 37 U/L (ref 14–54)
ANION GAP: 11 (ref 5–15)
AST: 45 U/L — ABNORMAL HIGH (ref 15–41)
Albumin: 3.5 g/dL (ref 3.5–5.0)
BILIRUBIN TOTAL: 0.6 mg/dL (ref 0.3–1.2)
BUN: 7 mg/dL (ref 6–20)
CALCIUM: 9.2 mg/dL (ref 8.9–10.3)
CO2: 21 mmol/L — ABNORMAL LOW (ref 22–32)
Chloride: 99 mmol/L — ABNORMAL LOW (ref 101–111)
Creatinine, Ser: 0.5 mg/dL (ref 0.44–1.00)
GFR calc non Af Amer: >60 mL/min (ref >60)
GLUCOSE: 81 mg/dL (ref 65–99)
Potassium: 3.5 mmol/L (ref 3.5–5.1)
Sodium: 131 mmol/L — ABNORMAL LOW (ref 135–145)
TOTAL PROTEIN: 7.2 g/dL (ref 6.5–8.1)

## 2017-04-19 LAB — CULTURE, OB URINE: Culture: >=100000 — AB

## 2017-04-19 LAB — URINALYSIS, ROUTINE W REFLEX MICROSCOPIC
BILIRUBIN URINE: NEGATIVE
GLUCOSE, UA: NEGATIVE mg/dL
KETONES UR: NEGATIVE mg/dL
NITRITE: NEGATIVE
PH: 7 (ref 5.0–8.0)
Protein, ur: NEGATIVE mg/dL
Specific Gravity, Urine: 1.009 (ref 1.005–1.030)

## 2017-04-19 LAB — CBC
HEMATOCRIT: 31.3 % — AB (ref 36.0–46.0)
HEMOGLOBIN: 10.9 g/dL — AB (ref 12.0–15.0)
MCH: 30.8 pg (ref 26.0–34.0)
MCHC: 34.8 g/dL (ref 30.0–36.0)
MCV: 88.4 fL (ref 78.0–100.0)
Platelets: 146 10*3/uL — ABNORMAL LOW (ref 150–400)
RBC: 3.54 MIL/uL — ABNORMAL LOW (ref 3.87–5.11)
RDW: 14.2 % (ref 11.5–15.5)
WBC: 8.6 10*3/uL (ref 4.0–10.5)

## 2017-04-19 LAB — TYPE AND SCREEN
ABO/RH(D): O NEG
Antibody Screen: NEGATIVE

## 2017-04-19 MED ORDER — ACETAMINOPHEN 325 MG PO TABS
650.0000 mg | ORAL_TABLET | ORAL | Status: DC | PRN
  Administered 2017-04-19 – 2017-04-21 (×4): 650 mg via ORAL
  Filled 2017-04-19 (×4): qty 2

## 2017-04-19 MED ORDER — CALCIUM CARBONATE ANTACID 500 MG PO CHEW: 2.0000 | CHEWABLE_TABLET | ORAL | Status: DC | PRN

## 2017-04-19 MED ORDER — SODIUM CHLORIDE 0.9 % IV SOLN
2.0000 g | INTRAVENOUS | Status: DC
  Administered 2017-04-19 – 2017-04-20 (×2): 2 g via INTRAVENOUS
  Filled 2017-04-19 (×2): qty 20

## 2017-04-19 MED ORDER — PRENATAL MULTIVITAMIN CH
1.0000 | ORAL_TABLET | Freq: Every day | ORAL | Status: DC
  Administered 2017-04-20: 1 via ORAL
  Filled 2017-04-19: qty 1

## 2017-04-19 MED ORDER — DOCUSATE SODIUM 100 MG PO CAPS
100.0000 mg | ORAL_CAPSULE | Freq: Every day | ORAL | Status: DC
  Administered 2017-04-19 – 2017-04-21 (×3): 100 mg via ORAL
  Filled 2017-04-19 (×3): qty 1

## 2017-04-19 MED ORDER — SODIUM CHLORIDE 0.9 % IV SOLN
INTRAVENOUS | Status: DC
  Administered 2017-04-19 – 2017-04-21 (×4): via INTRAVENOUS

## 2017-04-19 MED ORDER — SODIUM CHLORIDE 0.9 % IV SOLN
8.0000 mg | Freq: Three times a day (TID) | INTRAVENOUS | Status: DC | PRN
  Filled 2017-04-19: qty 4

## 2017-04-19 NOTE — Progress Notes (Addendum)
G1 @ 19.[redacted] wksga. Here for abd pain and fever although temp was 98.2 in triage. Has UTI with lower back pain. UTI symptoms started Saturday and being txed but not helpingt. Also complaints of being dehydrated no matter how much she drinks. States threwup midnight. Denies bleeding.   Labs and IV started.   1925: 3rd floor nurse notified. Report given.   1930: pt to 3rd floor.

## 2017-04-19 NOTE — MAU Note (Deleted)
HPI:   [name] is a 22yo G1P0 3557w6d who presents with n/v, back pain, and dysuria. She was seen in MAU 04/16/17 and treated for a UTI with Macrobid. Pt states that her symptoms have worsened since. She continues to complain of urinary urgency and dysuria. She has bilateral lower back pain and abdominal pain that she states makes it hard for her to move. She has had n/v x5 in the past 3 days but has had no emesis today. She reports a fever at 100.5. She denies any vaginal bleeding or LOF.

## 2017-04-19 NOTE — H&P (Signed)
Sherry Leach is a 22 y.o. female G1 @19 .6 wks presenting for Pyelonephritis. She reports N/V, back pain, and dysuria. She was seen in MAU 3 days ago, treated for a UTI, and has taken multiple does of Macrobid. Her sx have worsened and now having N/V. Back pain is bilateral and low but worse on right side. She reports a fever of 100.5 at home. No VB.  OB History    Gravida Para Term Preterm AB Living   1         0   SAB TAB Ectopic Multiple Live Births                 Past Medical History:  Diagnosis Date  . Medical history non-contributory    Past Surgical History:  Procedure Laterality Date  . MOUTH SURGERY     Family History: family history includes Hypertension in her mother and other. Social History:  reports that she has quit smoking. She smoked 0.50 packs per day. she has never used smokeless tobacco. She reports that she does not drink alcohol or use drugs.   Review of Systems  Constitutional: Positive for fever.  Gastrointestinal: Positive for abdominal pain, nausea and vomiting.  Genitourinary: Positive for dysuria, flank pain, frequency and urgency. Negative for hematuria.  Neurological: Positive for weakness.   Maternal Medical History:  Reason for admission: Nausea.       Blood pressure 111/62, pulse 91, temperature 98.7 F (37.1 C), temperature source Oral, resp. rate 16, weight 148 lb 8 oz (67.4 kg), last menstrual period 12/01/2016, SpO2 100 %. Exam Physical Exam  Constitutional: She is oriented to person, place, and time. She appears well-developed and well-nourished. She has a sickly appearance.  HENT:  Head: Normocephalic and atraumatic.  Neck: Normal range of motion.  Cardiovascular: Normal rate.  Respiratory: Effort normal. No respiratory distress.  GI: Soft. She exhibits no distension and no mass. There is no tenderness. There is CVA tenderness. There is no rebound and no guarding.  gravid  Musculoskeletal: Normal range of motion.       Lumbar back:  She exhibits tenderness (R>L).  Neurological: She is alert and oriented to person, place, and time.  Skin: Skin is warm and dry.  Psychiatric: She has a normal mood and affect.  FHT 160   Prenatal labs: ABO, Rh: O/Negative/-- (01/21 1211) Antibody: Negative (01/21 1211) Rubella: 1.02 (01/21 1211) RPR: Non Reactive (01/21 1211)  HBsAg: Negative (01/21 1211)  HIV: Non Reactive (01/21 1211)  GBS:  n/a  Assessment/Plan: [redacted] weeks gestation Pyelonephritis Admit to Ante Rocephin IV hydration Mngt per MD   Donette LarryMelanie Melisha Eggleton, CNM 04/19/2017, 6:40 PM

## 2017-04-19 NOTE — MAU Note (Addendum)
Was here on Sun, dx with UTI.  Is taking the medication, but has been running a fever off and on, highest of 100.5.  Still having abd pain, now having pain in back on rt side, feels weak, has thrown up. +rt CVA tenderness

## 2017-04-20 ENCOUNTER — Encounter (HOSPITAL_COMMUNITY): Payer: Self-pay

## 2017-04-20 ENCOUNTER — Telehealth: Payer: Self-pay | Admitting: General Practice

## 2017-04-20 MED ORDER — OXYCODONE-ACETAMINOPHEN 5-325 MG PO TABS
1.0000 | ORAL_TABLET | ORAL | Status: DC | PRN
  Administered 2017-04-20 – 2017-04-21 (×4): 1 via ORAL
  Filled 2017-04-20 (×4): qty 1

## 2017-04-20 NOTE — Telephone Encounter (Signed)
Patient called and left message on voicemail stating she was seen in MAU on Sunday for a UTI. Patient states she is taking her medication but is still waking up with pain and a fever. Per chart review, patient is currently admitted to the hospital

## 2017-04-20 NOTE — Plan of Care (Signed)
  Problem: Health Behavior/Discharge Planning: Goal: Ability to manage health-related needs will improve Outcome: Progressing   Problem: Clinical Measurements: Goal: Ability to maintain clinical measurements within normal limits will improve Outcome: Progressing   Problem: Clinical Measurements: Goal: Will remain free from infection Outcome: Progressing   Problem: Safety: Goal: Ability to remain free from injury will improve Outcome: Progressing   

## 2017-04-20 NOTE — Progress Notes (Signed)
FACULTY PRACTICE ANTEPARTUM(COMPREHENSIVE) NOTE  Sherry Leach is a 22 y.o. G1P0 at 1693w0d by LMP, midtrimester ultrasound who is admitted for pyelonephritis, Right side.   Fetal presentation is n/a. Length of Stay:  1  Days  Subjective: Pt notes continued RLQ discomfort and rt CVAT no nausea or vomiting. Patient reports the fetal movement as active. Patient reports uterine contraction  activity as none. Patient reports  vaginal bleeding as none. Patient describes fluid per vagina as None.  Vitals:  Blood pressure 110/62, pulse 94, temperature 98.7 F (37.1 C), temperature source Oral, resp. rate 20, weight 148 lb 8 oz (67.4 kg), last menstrual period 12/01/2016, SpO2 100 %. Physical Examination:  General appearance - alert, well appearing, and in no distress, ill-appearing and fatigued. Does not appear accustomed to discomfort Heart - normal rate and regular rhythm Abdomen - soft, nontender, nondistended Fundal Height:  size equals dates Cervical Exam: Extremities: extremities normal, atraumatic, no cyanosis or edema and Homans sign is negative, no sign of DVT with DTRs 2+ bilaterally Membranes:intact  Fetal Monitoring:  Will check q shift  Labs:  Results for orders placed or performed during the hospital encounter of 04/19/17 (from the past 24 hour(s))  Urinalysis, Routine w reflex microscopic   Collection Time: 04/19/17  5:57 PM  Result Value Ref Range   Color, Urine YELLOW YELLOW   APPearance HAZY (A) CLEAR   Specific Gravity, Urine 1.009 1.005 - 1.030   pH 7.0 5.0 - 8.0   Glucose, UA NEGATIVE NEGATIVE mg/dL   Hgb urine dipstick SMALL (A) NEGATIVE   Bilirubin Urine NEGATIVE NEGATIVE   Ketones, ur NEGATIVE NEGATIVE mg/dL   Protein, ur NEGATIVE NEGATIVE mg/dL   Nitrite NEGATIVE NEGATIVE   Leukocytes, UA MODERATE (A) NEGATIVE   RBC / HPF 0-5 0 - 5 RBC/hpf   WBC, UA 6-30 0 - 5 WBC/hpf   Bacteria, UA RARE (A) NONE SEEN   Squamous Epithelial / LPF 6-30 (A) NONE SEEN  CBC   Collection Time: 04/19/17  7:00 PM  Result Value Ref Range   WBC 8.6 4.0 - 10.5 K/uL   RBC 3.54 (L) 3.87 - 5.11 MIL/uL   Hemoglobin 10.9 (L) 12.0 - 15.0 g/dL   HCT 16.131.3 (L) 09.636.0 - 04.546.0 %   MCV 88.4 78.0 - 100.0 fL   MCH 30.8 26.0 - 34.0 pg   MCHC 34.8 30.0 - 36.0 g/dL   RDW 40.914.2 81.111.5 - 91.415.5 %   Platelets 146 (L) 150 - 400 K/uL  Comprehensive metabolic panel   Collection Time: 04/19/17  7:00 PM  Result Value Ref Range   Sodium 131 (L) 135 - 145 mmol/L   Potassium 3.5 3.5 - 5.1 mmol/L   Chloride 99 (L) 101 - 111 mmol/L   CO2 21 (L) 22 - 32 mmol/L   Glucose, Bld 81 65 - 99 mg/dL   BUN 7 6 - 20 mg/dL   Creatinine, Ser 7.820.50 0.44 - 1.00 mg/dL   Calcium 9.2 8.9 - 95.610.3 mg/dL   Total Protein 7.2 6.5 - 8.1 g/dL   Albumin 3.5 3.5 - 5.0 g/dL   AST 45 (H) 15 - 41 U/L   ALT 37 14 - 54 U/L   Alkaline Phosphatase 93 38 - 126 U/L   Total Bilirubin 0.6 0.3 - 1.2 mg/dL   GFR calc non Af Amer >60 >60 mL/min   GFR calc Af Amer >60 >60 mL/min   Anion gap 11 5 - 15  Type and [facility] [place]   Collection Time: 04/19/17  7:00 PM  Result Value Ref Range   ABO/RH(D) O NEG    Antibody Screen NEG    Sample Expiration      04/22/2017 Performed at [facility], [address], [place], [place] [address]     Imaging Studies:     Currently EPIC will not allow sonographic studies to automatically populate into notes.  In the meantime, copy and paste results into note or free text.  Medications:  Scheduled . docusate sodium  100 mg Oral Daily  . prenatal multivitamin  1 tablet Oral Q1200   I have reviewed the patient's current medications. On Rocephin 2 g q 24  ASSESSMENT: Patient Active Problem List   Diagnosis Date Noted  . Pyelonephritis affecting pregnancy 04/19/2017  . Supervision of normal first pregnancy, antepartum 02/20/2017  . Dry skin dermatitis 02/15/2013  . Dysmenorrhea 02/15/2013  . Anemia 02/15/2013  . Irregular menstrual cycle 02/15/2013  low pain  tolerance  PLAN: Probable d/c in am if she remains afebrile.  Tilda Burrow 04/20/2017,7:29 AM    Patient ID: Sherry Cancer, female   DOB: 06/18/1995, 22 y.o.   MRN: [id number]

## 2017-04-21 ENCOUNTER — Encounter (HOSPITAL_COMMUNITY): Payer: Self-pay

## 2017-04-21 DIAGNOSIS — O2302 Infections of kidney in pregnancy, second trimester: Principal | ICD-10-CM

## 2017-04-21 LAB — CULTURE, OB URINE: Culture: >=100000 — AB

## 2017-04-21 MED ORDER — CEPHALEXIN 500 MG PO CAPS: 500.0000 mg | ORAL_CAPSULE | Freq: Every day | ORAL | 5 refills | Status: DC

## 2017-04-21 MED ORDER — CEPHALEXIN 500 MG PO CAPS: 500.0000 mg | ORAL_CAPSULE | Freq: Four times a day (QID) | ORAL | 0 refills | Status: DC

## 2017-04-21 MED ORDER — OXYCODONE-ACETAMINOPHEN 5-325 MG PO TABS: 1.0000 | ORAL_TABLET | ORAL | 0 refills | Status: DC | PRN

## 2017-04-21 NOTE — Discharge Instructions (Signed)

## 2017-04-21 NOTE — Progress Notes (Signed)
Patient discharged home with family. Discharge teaching, home care, prescriptions, follow-up appts, and reasons to seek care discussed. No questions at this time, pt verbalized understanding.

## 2017-04-21 NOTE — Discharge Summary (Signed)
OB Discharge Summary     Patient Name: Sherry Leach DOB: 11-17-1995 MRN: [id number]  Date of admission: 04/19/2017  Date of discharge: 04/21/2017  Admitting diagnosis: 19.6WKS UTI, FEVER Intrauterine pregnancy: [redacted]w[redacted]d     Secondary diagnosis:  Active Problems:   Pyelonephritis affecting pregnancy  Hospital course:  Patient diagnosed with UTI and treated with microbid. She reported worsening pain and nausea/emesis. She was admitted for the treatment of pyelonephritis. She received IV antibiotics and remained afebrile for 48 hours. She was found stable for discharge with plans to follow as scheduled on 4/8 for routine prenatal care. Precautions reviewed with the patient. Patient to complete a 10-day course of antibiotics and to start daly suppression until delivery  Physical exam  Vitals:   04/20/17 2001 04/21/17 0019 04/21/17 0407 04/21/17 0800  BP: 109/62 (!) 104/47 (!) 105/47 (!) 114/54  Pulse: 91 78 86 75  Resp: 18 18 18 18   Temp: 99.2 F (37.3 C) 98.8 F (37.1 C) 98.1 F (36.7 C) 98.2 F (36.8 C)  TempSrc: Oral Oral Oral Oral  SpO2: 100% 98% 98% 95%  Weight:       General: alert, cooperative and no distress  Lungs: clear to auscultation bilaterally Heart: regular rate and rhythm Abdomen: soft, non tender Back: mild CVA tnederness DVT Evaluation: No evidence of DVT seen on physical exam.  FHT: positive on doppler Labs: Lab Results  Component Value Date   WBC 8.6 04/19/2017   HGB 10.9 (L) 04/19/2017   HCT 31.3 (L) 04/19/2017   MCV 88.4 04/19/2017   PLT 146 (L) 04/19/2017   CMP Latest Ref Rng & Units 04/19/2017  Glucose 65 - 99 mg/dL 81  BUN 6 - 20 mg/dL 7  Creatinine 4.09 - 8.11 mg/dL 9.14  Sodium 782 - 956 mmol/L 131(L)  Potassium 3.5 - 5.1 mmol/L 3.5  Chloride 101 - 111 mmol/L 99(L)  CO2 22 - 32 mmol/L 21(L)  Calcium 8.9 - 10.3 mg/dL 9.2  Total Protein 6.5 - 8.1 g/dL 7.2  Total Bilirubin 0.3 - 1.2 mg/dL 0.6  Alkaline Phos 38 - 126 U/L 93  AST 15 - 41 U/L  45(H)  ALT 14 - 54 U/L 37    Discharge instruction: per After Visit Summary and "Baby and Me Booklet".  After visit meds:  Allergies as of 04/21/2017      Reactions   Amoxicillin Rash   Has patient had a PCN reaction causing immediate rash, facial/tongue/throat swelling, SOB or lightheadedness with hypotension: No Has patient had a PCN reaction causing severe rash involving mucus membranes or skin necrosis: No Has patient had a PCN reaction that required hospitalization: No Has patient had a PCN reaction occurring within the last 10 years: Yes If all of the above answers are "NO", then may proceed with Cephalosporin use.   Penicillins Rash   Has patient had a PCN reaction causing immediate rash, facial/tongue/throat swelling, SOB or lightheadedness with hypotension: No Has patient had a PCN reaction causing severe rash involving mucus membranes or skin necrosis: No Has patient had a PCN reaction that required hospitalization: No Has patient had a PCN reaction occurring within the last 10 years: Yes If all of the above answers are "NO", then may proceed with Cephalosporin use.      Medication List    STOP taking these medications   nitrofurantoin (macrocrystal-monohydrate) 100 MG capsule Commonly known as:  MACROBID     TAKE these medications   acetaminophen 325 MG tablet Commonly known  as:  TYLENOL Take 325 mg by mouth every 6 (six) hours as needed for mild pain.   cephALEXin 500 MG capsule Commonly known as:  KEFLEX Take 1 capsule (500 mg total) by mouth 4 (four) times daily.   cephALEXin 500 MG capsule Commonly known as:  KEFLEX Take 1 capsule (500 mg total) by mouth at bedtime. Once you completed the 10 days of 4 times daily antibiotics   oxyCODONE-acetaminophen 5-325 MG tablet Commonly known as:  PERCOCET/ROXICET Take 1 tablet by mouth every 4 (four) hours as needed for moderate pain or severe pain.   polyethylene glycol powder powder Commonly known as:   GLYCOLAX/MIRALAX Take 17 g by mouth daily.   prenatal multivitamin Tabs tablet Take 1 tablet by mouth daily at 12 noon.       Diet: routine diet  Activity: Advance as tolerated. Pelvic rest for 6 weeks.   Outpatient follow up: Follow up Appt: Future Appointments  Date Time Provider Department Center  05/08/2017  3:15 PM Armando ReichertHogan, Heather D, [facility]M [facility]    04/21/2017 Catalina AntiguaPeggy Prince Olivier, MD

## 2017-04-27 ENCOUNTER — Encounter (HOSPITAL_COMMUNITY): Payer: Self-pay | Admitting: *Deleted

## 2017-04-27 ENCOUNTER — Other Ambulatory Visit: Payer: Self-pay

## 2017-04-27 ENCOUNTER — Inpatient Hospital Stay (HOSPITAL_COMMUNITY)
Admission: AD | Admit: 2017-04-27 | Discharge: 2017-04-27 | Disposition: A | Discharge status: Home / Self Care | Payer: Medicaid Other | Source: Ambulatory Visit | Attending: Obstetrics & Gynecology | Admitting: Obstetrics & Gynecology

## 2017-04-27 DIAGNOSIS — Z880 Allergy status to penicillin: Secondary | ICD-10-CM | POA: Insufficient documentation

## 2017-04-27 DIAGNOSIS — Z87891 Personal history of nicotine dependence: Secondary | ICD-10-CM | POA: Diagnosis not present

## 2017-04-27 DIAGNOSIS — Z3A21 21 weeks gestation of pregnancy: Secondary | ICD-10-CM

## 2017-04-27 DIAGNOSIS — N930 Postcoital and contact bleeding: Secondary | ICD-10-CM | POA: Diagnosis not present

## 2017-04-27 DIAGNOSIS — O26852 Spotting complicating pregnancy, second trimester: Secondary | ICD-10-CM | POA: Insufficient documentation

## 2017-04-27 DIAGNOSIS — O9989 Other specified diseases and conditions complicating pregnancy, childbirth and the puerperium: Secondary | ICD-10-CM | POA: Diagnosis not present

## 2017-04-27 DIAGNOSIS — Z3400 Encounter for supervision of normal first pregnancy, unspecified trimester: Secondary | ICD-10-CM

## 2017-04-27 LAB — URINALYSIS, ROUTINE W REFLEX MICROSCOPIC
Bilirubin Urine: NEGATIVE
GLUCOSE, UA: NEGATIVE mg/dL
KETONES UR: NEGATIVE mg/dL
Nitrite: NEGATIVE
PROTEIN: NEGATIVE mg/dL
Specific Gravity, Urine: 1.006 (ref 1.005–1.030)
pH: 8 (ref 5.0–8.0)

## 2017-04-27 LAB — WET PREP, GENITAL
Clue Cells Wet Prep HPF POC: NONE SEEN
Trich, Wet Prep: NONE SEEN
Yeast Wet Prep HPF POC: NONE SEEN

## 2017-04-27 NOTE — MAU Provider Note (Signed)
History     CSN: [id number]  Arrival date and time: 04/27/17 1120   First Provider Initiated Contact with Patient 04/27/17 1158      Chief Complaint  Patient presents with  . Vaginal Bleeding   G1 @21 .0 weeks here with spotting. First noticed around 6am and once after. Saw red blood on the toilet paper. Had IC around 4am. No abd pain or cramping.   OB History    Gravida  1   Para      Term      Preterm      AB      Living  0     SAB      TAB      Ectopic      Multiple      Live Births              Past Medical History:  Diagnosis Date  . Medical history non-contributory     Past Surgical History:  Procedure Laterality Date  . MOUTH SURGERY      Family History  Problem Relation Age of Onset  . Hypertension Mother   . Hypertension Other     Social History   Tobacco Use  . Smoking status: Former Smoker    Packs/day: 0.50  . Smokeless tobacco: Never Used  Substance Use Topics  . Alcohol use: No  . Drug use: No    Allergies:  Allergies  Allergen Reactions  . Amoxicillin Rash    Has patient had a PCN reaction causing immediate rash, facial/tongue/throat swelling, SOB or lightheadedness with hypotension: No Has patient had a PCN reaction causing severe rash involving mucus membranes or skin necrosis: No Has patient had a PCN reaction that required hospitalization: No Has patient had a PCN reaction occurring within the last 10 years: Yes If all of the above answers are "NO", then may proceed with Cephalosporin use.   Marland Kitchen. Penicillins Rash    Has patient had a PCN reaction causing immediate rash, facial/tongue/throat swelling, SOB or lightheadedness with hypotension: No Has patient had a PCN reaction causing severe rash involving mucus membranes or skin necrosis: No Has patient had a PCN reaction that required hospitalization: No Has patient had a PCN reaction occurring within the last 10 years: Yes If all of the above answers are "NO", then  may proceed with Cephalosporin use.     Medications Prior to Admission  Medication Sig Dispense Refill Last Dose  . acetaminophen (TYLENOL) 325 MG tablet Take 325 mg by mouth every 6 (six) hours as needed for mild pain.   04/26/2017 at Unknown time  . cephALEXin (KEFLEX) 500 MG capsule Take 1 capsule (500 mg total) by mouth 4 (four) times daily. 40 capsule 0 04/27/2017 at Unknown time  . oxyCODONE-acetaminophen (PERCOCET/ROXICET) 5-325 MG tablet Take 1 tablet by mouth every 4 (four) hours as needed for moderate pain or severe pain. 10 tablet 0 Past Week at Unknown time  . Prenatal Vit-Fe Fumarate-FA (PRENATAL MULTIVITAMIN) TABS tablet Take 1 tablet by mouth daily at 12 noon.   04/27/2017 at Unknown time  . cephALEXin (KEFLEX) 500 MG capsule Take 1 capsule (500 mg total) by mouth at bedtime. Once you completed the 10 days of 4 times daily antibiotics (Patient not taking: Reported on 04/27/2017) 30 capsule 5 Not Taking at Unknown time  . polyethylene glycol powder (GLYCOLAX/MIRALAX) powder Take 17 g by mouth daily. (Patient not taking: Reported on 04/27/2017) 255 g 0 Completed Course at Unknown time  Review of Systems  Gastrointestinal: Negative for abdominal pain.  Genitourinary: Positive for vaginal bleeding. Negative for vaginal discharge.   Physical Exam   Blood pressure 121/72, pulse 68, temperature 97.9 F (36.6 C), temperature source Oral, resp. rate 20, height 5\' 8"  (1.727 m), weight 156 lb (70.8 kg), last menstrual period 12/01/2016, SpO2 100 %.  Physical Exam  Constitutional: She is oriented to person, place, and time. She appears well-developed and well-nourished. No distress.  HENT:  Head: Normocephalic and atraumatic.  Neck: Normal range of motion.  Cardiovascular: Normal rate.  Respiratory: Effort normal. No respiratory distress.  GI: Soft. She exhibits no distension. There is no tenderness.  gravid  Genitourinary:  Genitourinary Comments: External: no lesions or  erythema Vagina: rugated, pink, moist, no blood or discharge, ectropian present Cervix closed/long   Musculoskeletal: Normal range of motion.  Neurological: She is alert and oriented to person, place, and time.  Skin: Skin is warm and dry.  Psychiatric: She has a normal mood and affect.  FHT 154  Results for orders placed or performed during the hospital encounter of 04/27/17 (from the past 24 hour(s))  Urinalysis, Routine w reflex microscopic     Status: Abnormal   Collection Time: 04/27/17 11:26 AM  Result Value Ref Range   Color, Urine STRAW (A) YELLOW   APPearance CLEAR CLEAR   Specific Gravity, Urine 1.006 1.005 - 1.030   pH 8.0 5.0 - 8.0   Glucose, UA NEGATIVE NEGATIVE mg/dL   Hgb urine dipstick SMALL (A) NEGATIVE   Bilirubin Urine NEGATIVE NEGATIVE   Ketones, ur NEGATIVE NEGATIVE mg/dL   Protein, ur NEGATIVE NEGATIVE mg/dL   Nitrite NEGATIVE NEGATIVE   Leukocytes, UA LARGE (A) NEGATIVE   RBC / HPF 0-5 0 - 5 RBC/hpf   WBC, UA 6-30 0 - 5 WBC/hpf   Bacteria, UA RARE (A) NONE SEEN   Squamous Epithelial / LPF 0-5 (A) NONE SEEN   Sperm, UA PRESENT   Wet prep, genital     Status: Abnormal   Collection Time: 04/27/17 12:03 PM  Result Value Ref Range   Yeast Wet Prep HPF POC NONE SEEN NONE SEEN   Trich, Wet Prep NONE SEEN NONE SEEN   Clue Cells Wet Prep HPF POC NONE SEEN NONE SEEN   WBC, Wet Prep HPF POC FEW (A) NONE SEEN   Sperm PRESENT    MAU Course  Procedures  MDM Labs ordered and reviewed. No evidence of bleeding on exam or threatened PTL. Bleeding likely caused by IC. Stable for discharge home.  Assessment and Plan   1. [redacted] weeks gestation of pregnancy   2. Supervision of normal first pregnancy, antepartum   3. PCB (post coital bleeding)    Discharge home Follow up in OB office as scheduled PTL precautions  Allergies as of 04/27/2017      Reactions   Amoxicillin Rash   Has patient had a PCN reaction causing immediate rash, facial/tongue/throat swelling, SOB  or lightheadedness with hypotension: No Has patient had a PCN reaction causing severe rash involving mucus membranes or skin necrosis: No Has patient had a PCN reaction that required hospitalization: No Has patient had a PCN reaction occurring within the last 10 years: Yes If all of the above answers are "NO", then may proceed with Cephalosporin use.   Penicillins Rash   Has patient had a PCN reaction causing immediate rash, facial/tongue/throat swelling, SOB or lightheadedness with hypotension: No Has patient had a PCN reaction causing severe rash involving mucus  membranes or skin necrosis: No Has patient had a PCN reaction that required hospitalization: No Has patient had a PCN reaction occurring within the last 10 years: Yes If all of the above answers are "NO", then may proceed with Cephalosporin use.      Medication List    STOP taking these medications   oxyCODONE-acetaminophen 5-325 MG tablet Commonly known as:  PERCOCET/ROXICET     TAKE these medications   acetaminophen 325 MG tablet Commonly known as:  TYLENOL Take 325 mg by mouth every 6 (six) hours as needed for mild pain.   cephALEXin 500 MG capsule Commonly known as:  KEFLEX Take 1 capsule (500 mg total) by mouth at bedtime. Once you completed the 10 days of 4 times daily antibiotics What changed:  Another medication with the same name was removed. Continue taking this medication, and follow the directions you see here.   polyethylene glycol powder powder Commonly known as:  GLYCOLAX/MIRALAX Take 17 g by mouth daily.   prenatal multivitamin Tabs tablet Take 1 tablet by mouth daily at 12 noon.      Donette Larry, CNM 04/27/2017, 12:39 PM

## 2017-04-27 NOTE — Discharge Instructions (Signed)
Vaginal Bleeding During Pregnancy, Second Trimester A small amount of bleeding (spotting) from the vagina is common in pregnancy. Sometimes the bleeding is normal and is not a problem, and sometimes it is a sign of something serious. Be sure to tell your doctor about any bleeding from your vagina right away. Follow these instructions at home:  Watch your condition for any changes.  Follow your doctor's instructions about how active you can be.  If you are on bed rest: ? You may need to stay in bed and only get up to use the bathroom. ? You may be allowed to do some activities. ? If you need help, make plans for someone to help you.  Write down: ? The number of pads you use each day. ? How often you change pads. ? How soaked (saturated) your pads are.  Do not use tampons.  Do not douche.  Do not have sex or orgasms until your doctor says it is okay.  If you pass any tissue from your vagina, save the tissue so you can show it to your doctor.  Only take medicines as told by your doctor.  Do not take aspirin because it can make you bleed.  Do not exercise, lift heavy weights, or do any activities that take a lot of energy and effort unless your doctor says it is okay.  Keep all follow-up visits as told by your doctor. Contact a doctor if:  You bleed from your vagina.  You have cramps.  You have labor pains.  You have a fever that does not go away after you take medicine. Get help right away if:  You have very bad cramps in your back or belly (abdomen).  You have contractions.  You have chills.  You pass large clots or tissue from your vagina.  You bleed more.  You feel light-headed or weak.  You pass out (faint).  You are leaking fluid or have a gush of fluid from your vagina. This information is not intended to replace advice given to you by your health care provider. Make sure you discuss any questions you have with your health care provider. Document  Released: 06/03/2013 Document Revised: 06/25/2015 Document Reviewed: 09/24/2012 Elsevier Interactive Patient Education  2018 Elsevier Inc.  

## 2017-04-27 NOTE — MAU Note (Signed)
Pt presents with c/o VB with that began this morning.  Denies abdominal cramping.  Reports sexual intercourse last night, VB started afterwards.

## 2017-04-28 LAB — GC/CHLAMYDIA PROBE AMP (~~LOC~~) NOT AT ARMC
CHLAMYDIA, DNA PROBE: POSITIVE — AB
Neisseria Gonorrhea: NEGATIVE

## 2017-05-02 ENCOUNTER — Telehealth: Payer: Self-pay

## 2017-05-02 MED ORDER — AZITHROMYCIN 250 MG PO TABS: 1000.0000 mg | ORAL_TABLET | Freq: Once | ORAL | 0 refills | Status: AC

## 2017-05-02 NOTE — Telephone Encounter (Addendum)
-----   Message from Donette LarryMelanie Bhambri, [place] sent at 04/28/2017 11:19 AM EDT ----- Needs Azithromycin 1g and partner needs treatment  Notified pt of + chlamydia results and that [facility] [address] And to make sure that her partner(s) are treated as well.  Pt stated understanding with no further questions.  STD card faxed.

## 2017-05-03 ENCOUNTER — Encounter: Payer: Self-pay | Admitting: *Deleted

## 2017-05-05 ENCOUNTER — Telehealth: Payer: Self-pay | Admitting: General Practice

## 2017-05-05 NOTE — Telephone Encounter (Signed)
Patient called and left message on voicemail stating she has a question about a medication. Called patient and she states she is on antibiotics for the remainder of her pregnancy due to a bladder infection. She is wanting to know if it's okay to keep taking that medication even with the antibiotics she got the other day. Told patient yes that is perfectly fine & safe in pregnancy. Patient verbalized understanding & had no other questions

## 2017-05-08 ENCOUNTER — Ambulatory Visit (INDEPENDENT_AMBULATORY_CARE_PROVIDER_SITE_OTHER): Payer: Medicaid Other | Admitting: Advanced Practice Midwife

## 2017-05-08 ENCOUNTER — Encounter: Payer: Self-pay | Admitting: Advanced Practice Midwife

## 2017-05-08 VITALS — BP 137/75 | HR 87 | Wt 158.9 lb

## 2017-05-08 DIAGNOSIS — Z3400 Encounter for supervision of normal first pregnancy, unspecified trimester: Secondary | ICD-10-CM

## 2017-05-08 NOTE — Progress Notes (Signed)
   PRENATAL VISIT NOTE  Subjective:  Sherry Leach is a 22 y.o. G1P0 at 5446w4d being seen today for ongoing prenatal care.  She is currently monitored for the following issues for this low-risk pregnancy and has Dry skin dermatitis; Dysmenorrhea; Anemia; Irregular menstrual cycle; Supervision of normal first pregnancy, antepartum; and Pyelonephritis affecting pregnancy on their problem list.  Patient reports no complaints.  Contractions: Not present. Vag. Bleeding: None.  Movement: Present. Denies leaking of fluid.   The following portions of the patient's history were reviewed and updated as appropriate: allergies, current medications, past family history, past medical history, past social history, past surgical history and problem list. Problem list updated.  Objective:   Vitals:   05/08/17 1549  BP: 137/75  Pulse: 87  Weight: 158 lb 14.4 oz (72.1 kg)    Fetal Status: Fetal Heart Rate (bpm): 147 Fundal Height: 22 cm Movement: Present     General:  Alert, oriented and cooperative. Patient is in no acute distress.  Skin: Skin is warm and dry. No rash noted.   Cardiovascular: Normal heart rate noted  Respiratory: Normal respiratory effort, no problems with respiration noted  Abdomen: Soft, gravid, appropriate for gestational age.  Pain/Pressure: Absent     Pelvic: Cervical exam deferred        Extremities: Normal range of motion.  Edema: Trace  Mental Status: Normal mood and affect. Normal behavior. Normal judgment and thought content.   Assessment and Plan:  Pregnancy: G1P0 at 3846w4d  1. Supervision of normal first pregnancy, antepartum - US MFM OB FOLLOW UP; Future - Will need TOC for chlamydia at [place] - Patient states she would like to have GC/CT testing at future visits if possible.  - Patient taking daily prophylaxis with Keflex 500mg  QD for UTI  Preterm labor symptoms and general obstetric precautions including but not limited to vaginal bleeding, contractions, leaking of fluid  and fetal movement were reviewed in detail with the patient. Please refer to After Visit Summary for other counseling recommendations.  Return in about 1 month (around 06/05/2017).  No future appointments.  Thressa ShellerHeather Jevan Gaunt, CNM

## 2017-05-26 ENCOUNTER — Ambulatory Visit (HOSPITAL_COMMUNITY)
Admission: RE | Admit: 2017-05-26 | Discharge: 2017-05-26 | Disposition: A | Discharge status: Home / Self Care | Payer: Medicaid Other | Source: Ambulatory Visit | Attending: Advanced Practice Midwife | Admitting: Advanced Practice Midwife

## 2017-05-26 ENCOUNTER — Other Ambulatory Visit: Payer: Self-pay | Admitting: Advanced Practice Midwife

## 2017-05-26 DIAGNOSIS — Z3400 Encounter for supervision of normal first pregnancy, unspecified trimester: Secondary | ICD-10-CM

## 2017-05-26 DIAGNOSIS — O359XX0 Maternal care for (suspected) fetal abnormality and damage, unspecified, not applicable or unspecified: Secondary | ICD-10-CM

## 2017-05-26 DIAGNOSIS — Z3A25 25 weeks gestation of pregnancy: Secondary | ICD-10-CM | POA: Insufficient documentation

## 2017-05-26 DIAGNOSIS — Z362 Encounter for other antenatal screening follow-up: Secondary | ICD-10-CM | POA: Diagnosis present

## 2017-05-29 ENCOUNTER — Inpatient Hospital Stay (HOSPITAL_COMMUNITY)
Admission: AD | Admit: 2017-05-29 | Discharge: 2017-05-29 | Disposition: A | Discharge status: Home / Self Care | Payer: Medicaid Other | Source: Ambulatory Visit | Attending: Obstetrics & Gynecology | Admitting: Obstetrics & Gynecology

## 2017-05-29 ENCOUNTER — Encounter: Payer: Self-pay | Admitting: Advanced Practice Midwife

## 2017-05-29 ENCOUNTER — Encounter (HOSPITAL_COMMUNITY): Payer: Self-pay | Admitting: *Deleted

## 2017-05-29 ENCOUNTER — Other Ambulatory Visit: Payer: Self-pay

## 2017-05-29 DIAGNOSIS — M545 Low back pain, unspecified: Secondary | ICD-10-CM

## 2017-05-29 DIAGNOSIS — O358XX0 Maternal care for other (suspected) fetal abnormality and damage, not applicable or unspecified: Secondary | ICD-10-CM | POA: Insufficient documentation

## 2017-05-29 DIAGNOSIS — O9989 Other specified diseases and conditions complicating pregnancy, childbirth and the puerperium: Secondary | ICD-10-CM | POA: Diagnosis not present

## 2017-05-29 DIAGNOSIS — O26892 Other specified pregnancy related conditions, second trimester: Secondary | ICD-10-CM | POA: Diagnosis present

## 2017-05-29 DIAGNOSIS — M549 Dorsalgia, unspecified: Secondary | ICD-10-CM | POA: Diagnosis not present

## 2017-05-29 DIAGNOSIS — Z880 Allergy status to penicillin: Secondary | ICD-10-CM | POA: Insufficient documentation

## 2017-05-29 DIAGNOSIS — W108XXA Fall (on) (from) other stairs and steps, initial encounter: Secondary | ICD-10-CM | POA: Diagnosis not present

## 2017-05-29 DIAGNOSIS — W109XXA Fall (on) (from) unspecified stairs and steps, initial encounter: Secondary | ICD-10-CM | POA: Diagnosis not present

## 2017-05-29 DIAGNOSIS — O35EXX0 Maternal care for other (suspected) fetal abnormality and damage, fetal genitourinary anomalies, not applicable or unspecified: Secondary | ICD-10-CM | POA: Insufficient documentation

## 2017-05-29 DIAGNOSIS — Z87891 Personal history of nicotine dependence: Secondary | ICD-10-CM | POA: Insufficient documentation

## 2017-05-29 DIAGNOSIS — Z3A25 25 weeks gestation of pregnancy: Secondary | ICD-10-CM | POA: Insufficient documentation

## 2017-05-29 DIAGNOSIS — Z8619 Personal history of other infectious and parasitic diseases: Secondary | ICD-10-CM

## 2017-05-29 LAB — URINALYSIS, ROUTINE W REFLEX MICROSCOPIC
BILIRUBIN URINE: NEGATIVE
GLUCOSE, UA: NEGATIVE mg/dL
Hgb urine dipstick: NEGATIVE
KETONES UR: NEGATIVE mg/dL
NITRITE: NEGATIVE
PH: 7 (ref 5.0–8.0)
Protein, ur: NEGATIVE mg/dL
SPECIFIC GRAVITY, URINE: 1.002 — AB (ref 1.005–1.030)

## 2017-05-29 NOTE — MAU Note (Signed)
'  was being clumsy', going to get food when half asleep and tumbled down the steps.  Just wanted to make sure the baby is ok. (6 or 7 steps) landed on her side. Aching in her back.

## 2017-05-29 NOTE — Discharge Instructions (Signed)
Preventing Injuries During Pregnancy Injuries can happen during pregnancy. Minor falls and accidents usually do not harm you or your baby. But some injuries can harm you and your baby. Tell your doctor about any injury you suffer. What can I do to avoid injuries? Safety  Remove rugs and loose objects on the floor.  Wear comfortable shoes that have a good grip. Do not wear shoes that have high heels.  Always wear your seat belt in the car. The lap belt should be below your belly. Always drive safely.  Do not ride on a motorcycle. Activity  Do not take part in rough and violent activities or sports.  Avoid: ? Walking on wet or slippery floors. ? Lifting heavy pots of boiling or hot liquids. ? Fixing electrical problems. ? Being near fires. General instructions  Take over-the-counter and prescription medicines only as told by your doctor.  Know your blood type and the blood type of the baby's father.  If you are a victim of domestic violence: ? Call your local emergency services (911 in the [place] ? Contact the [employer] Violence Hotline for help and support. Get help right away if:  You fall on your belly or receive any serious blow to your belly.  You have a stiff neck or neck pain after a fall or an injury.  You get a headache or have problems with vision after an injury.  You do not feel the baby move or the baby is not moving as much as normal.  You have been a victim of domestic violence or any other kind of attack.  You have been in a car accident.  You have bleeding from your vagina.  Fluid is leaking from your vagina.  You start to have cramping or pain in your belly (contractions).  You have very bad pain in your lower back.  You feel weak or pass out (faint).  You start to throw up (vomit) after an injury.  You have been burned. Summary  Some injuries that happen during pregnancy can do harm to the baby.  Tell your doctor about any  injury.  Take steps to avoid injury. This includes removing rugs and loose objects on the floor. Always wear your seat belt in the car.  Do not take part in rough and violent activities or sports.  Get help right away if you have any serious accident or injury. This information is not intended to replace advice given to you by your health care provider. Make sure you discuss any questions you have with your health care provider. Document Released: 02/19/2010 Document Revised: 01/27/2016 Document Reviewed: 01/27/2016 Elsevier Interactive Patient Education  2017 [employer] Inc.  Chlamydia, Female Chlamydia is an STD (sexually transmitted disease). It is a bacterial infection that spreads (is contagious) through sexual contact. Chlamydia can occur in different areas of the body, including:  The tube that moves urine from the bladder out of the body (urethra).  The lower part of the uterus (cervix).  The throat.  The rectum.  This condition is not difficult to treat. However, if left untreated, chlamydia can lead to more serious health problems, including pelvic inflammatory disorder (PID). PID can increase your risk of not being able to have children (sterility). What are the causes? Chlamydia is caused by the bacteria Chlamydia trachomatis. It is passed from an infected partner during sexual activity. Chlamydia can spread through contact with the genitals, mouth, or rectum. What are the signs or symptoms? In some cases, there  may not be any symptoms for this condition (asymptomatic), especially early in the infection. If symptoms develop, they may include:  Burning with urination.  Frequent urination.  Vaginal discharge.  Redness, soreness, and swelling (inflammation) of the rectum.  Bleeding or discharge from the rectum.  Abdominal pain.  Pain during sexual intercourse.  Bleeding between menstrual periods.  Itching, burning, or redness in the eyes, or discharge from the  eyes.  How is this diagnosed? This condition may be diagnosed with:  Urine tests.  Swab tests. Depending on your symptoms, your health care provider may use a cotton swab to collect discharge from your vagina or rectum to test for the bacteria.  A pelvic exam.  How is this treated? This condition is treated with antibiotic medicines. If you are pregnant, certain types of antibiotics will need to be avoided. Follow these instructions at home: Medicines  Take over-the-counter and prescription medicines only as told by your health care provider.  Take your antibiotic medicine as told by your health care provider. Do not stop taking the antibiotic even if you start to feel better. Sexual activity  Tell sexual partners about your infection. This includes any oral, anal, or vaginal sex partners you have had within 60 days of when your symptoms started. Sexual partners should also be treated, even if they have no signs of the disease.  Do not have sex until you and your sexual partners have completed treatment and your health care provider says it is okay. If your health care provider prescribed you a single dose treatment, wait 7 days after taking the treatment before having sex. General instructions  It is your responsibility to get your test results. Ask your health care provider, or the department performing the test, when your results will be ready.  Get plenty of rest.  Eat a healthy, well-balanced diet.  Drink enough fluids to keep your urine clear or pale yellow.  Keep all follow-up visits as told by your health care provider. This is important. You may need to be tested for infection again 3 months after treatment. How is this prevented? The only sure way to prevent chlamydia is to avoid having sex. However, you can lower your risk by:  Using latex condoms correctly every time you have sex.  Not having multiple sexual partners.  Asking if your sexual partner has been  tested for STIs and had negative results.  Contact a health care provider if:  You develop new symptoms or your symptoms do not get better after completing treatment.  You have a fever or chills.  You have pain during sexual intercourse. Get help right away if:  Your pain gets worse and does not get better with medicine.  You develop flu-like symptoms, such as night sweats, sore throat, or muscle aches.  You experience nausea or vomiting.  You have difficulty swallowing.  You have bleeding between periods or after sex.  You have irregular menstrual periods.  You have abdominal or lower back pain that does not get better with medicine.  You feel weak or dizzy, or you faint.  You are pregnant and you develop symptoms of chlamydia. Summary  Chlamydia is an STD (sexually transmitted disease). It is a bacterial infection that spreads (is contagious) through sexual contact.  This condition is not difficult to treat, however. If left untreated, chlamydia can lead to more serious health problems, including pelvic inflammatory disease (PID).  In some cases, there may not be any symptoms for this condition (asymptomatic).  This condition is treated with antibiotic medicines.  Using latex condoms correctly every time you have sex can help prevent chlamydia. This information is not intended to replace advice given to you by your health care provider. Make sure you discuss any questions you have with your health care provider. Document Released: 10/27/2004 Document Revised: 01/04/2016 Document Reviewed: 01/04/2016 Elsevier Interactive Patient Education  [employer].

## 2017-05-29 NOTE — MAU Provider Note (Signed)
Chief Complaint:  Fall   First Provider Initiated Contact with Patient 05/29/17 1535     HPI: Sherry Leach is a 22 y.o. G1P0 at 31w4dwho presents to maternity admissions reporting fall down stairs today.  Has some pain in back and some cramping.  Also requests testing for STDs "just to be sure" Of note, was + for chlamydia last month., treated. . She reports good fetal movement, denies LOF, vaginal bleeding, vaginal itching/burning, urinary symptoms, h/a, dizziness, n/v, diarrhea, constipation or fever/chills.    Fall  The accident occurred 3 to 6 hours ago. The fall occurred while walking. Distance fallen: stairs. There was no blood loss. The pain is present in the back. The pain is mild. Associated symptoms include abdominal pain. Pertinent negatives include no fever, headaches, hematuria, loss of consciousness, nausea, numbness or tingling. She has tried nothing for the symptoms.     RN Note: 'was being clumsy', going to get food when half asleep and tumbled down the steps.  Just wanted to make sure the baby is ok. (6 or 7 steps) landed on her side. Aching in her back.     Past Medical History: Past Medical History:  Diagnosis Date  . Medical history non-contributory     Past obstetric history: OB History  Gravida Para Term Preterm AB Living  1         0  SAB TAB Ectopic Multiple Live Births               # Outcome Date GA Lbr Len/2nd Weight Sex Delivery Anes PTL Lv  1 Current             Past Surgical History: Past Surgical History:  Procedure Laterality Date  . MOUTH SURGERY      Family History: Family History  Problem Relation Age of Onset  . Hypertension Mother   . Hypertension Other     Social History: Social History   Tobacco Use  . Smoking status: Former Smoker    Packs/day: 0.50  . Smokeless tobacco: Never Used  Substance Use Topics  . Alcohol use: No  . Drug use: No    Allergies:  Allergies  Allergen Reactions  . Amoxicillin Rash    Has  patient had a PCN reaction causing immediate rash, facial/tongue/throat swelling, SOB or lightheadedness with hypotension: No Has patient had a PCN reaction causing severe rash involving mucus membranes or skin necrosis: No Has patient had a PCN reaction that required hospitalization: No Has patient had a PCN reaction occurring within the last 10 years: Yes If all of the above answers are "NO", then may proceed with Cephalosporin use.   Marland Kitchen Penicillins Rash    Has patient had a PCN reaction causing immediate rash, facial/tongue/throat swelling, SOB or lightheadedness with hypotension: No Has patient had a PCN reaction causing severe rash involving mucus membranes or skin necrosis: No Has patient had a PCN reaction that required hospitalization: No Has patient had a PCN reaction occurring within the last 10 years: Yes If all of the above answers are "NO", then may proceed with Cephalosporin use.     Meds:  Medications Prior to Admission  Medication Sig Dispense Refill Last Dose  . acetaminophen (TYLENOL) 325 MG tablet Take 325 mg by mouth every 6 (six) hours as needed for mild pain.   Taking  . cephALEXin (KEFLEX) 500 MG capsule Take 1 capsule (500 mg total) by mouth at bedtime. Once you completed the 10 days of 4 times daily  antibiotics 30 capsule 5 Taking  . Prenatal Vit-Fe Fumarate-FA (PRENATAL MULTIVITAMIN) TABS tablet Take 1 tablet by mouth daily at 12 noon.   Taking    I have reviewed patient's Past Medical Hx, Surgical Hx, Family Hx, Social Hx, medications and allergies.   ROS:  Review of Systems  Constitutional: Negative for fever.  Gastrointestinal: Positive for abdominal pain. Negative for nausea.  Genitourinary: Negative for hematuria.  Neurological: Negative for tingling, loss of consciousness, numbness and headaches.   Other systems negative  Physical Exam   Patient Vitals for the past 24 hrs:  BP Temp Temp src Pulse Resp SpO2 Weight  05/29/17 1443 133/79 98.7 F (37.1  C) Oral 100 17 99 % 166 lb 12 oz (75.6 kg)   Constitutional: Well-developed, well-nourished female in no acute distress.  Cardiovascular: normal rate and rhythm Respiratory: normal effort, clear to auscultation bilaterally GI: Abd soft, non-tender, gravid appropriate for gestational age.   No rebound or guarding. MS: Extremities nontender, no edema, normal ROM Neurologic: Alert and oriented x 4.  GU: Neg CVAT.  PELVIC EXAM: Cervix pink, visually closed, without lesion, scant white creamy discharge, vaginal walls and external genitalia normal Cervix long and closed    FHT:  Baseline 140 , moderate variability, accelerations present, no decelerations Contractions: Uterine irritability  Irregular    Labs: Results for orders placed or performed during the hospital encounter of 05/29/17 (from the past 24 hour(s))  Urinalysis, Routine w reflex microscopic     Status: Abnormal   Collection Time: 05/29/17  2:23 PM  Result Value Ref Range   Color, Urine STRAW (A) YELLOW   APPearance HAZY (A) CLEAR   Specific Gravity, Urine 1.002 (L) 1.005 - 1.030   pH 7.0 5.0 - 8.0   Glucose, UA NEGATIVE NEGATIVE mg/dL   Hgb urine dipstick NEGATIVE NEGATIVE   Bilirubin Urine NEGATIVE NEGATIVE   Ketones, ur NEGATIVE NEGATIVE mg/dL   Protein, ur NEGATIVE NEGATIVE mg/dL   Nitrite NEGATIVE NEGATIVE   Leukocytes, UA SMALL (A) NEGATIVE   RBC / HPF 0-5 0 - 5 RBC/hpf   WBC, UA 0-5 0 - 5 WBC/hpf   Bacteria, UA RARE (A) NONE SEEN   Squamous Epithelial / LPF 6-10 0 - 5   --/--/O NEG (03/20 1900)  Imaging:    MAU Course/MDM: I have ordered labs and reviewed results. UA negative NST reviewed, reactive, reassuring with uterine irritability  Treatments in MAU included Test of cure for chlamydia., EFM.    Assessment: Single IUP at [redacted]w[redacted]d Fall down several stairs Back pain, likely contusion Uterine irritability without cervical change Hx chlamydia a month ago, treated, TOC sent  Plan: Discharge  home Test of cure sent for chlamydia Preterm Labor precautions and fetal kick counts Follow up in Office for prenatal visits and recheck of status  Pt stable at time of discharge.  Wynelle Bourgeois CNM, MSN Certified Nurse-Midwife 05/29/2017 3:35 PM

## 2017-05-30 ENCOUNTER — Encounter (HOSPITAL_COMMUNITY): Payer: Self-pay

## 2017-05-30 ENCOUNTER — Inpatient Hospital Stay (HOSPITAL_COMMUNITY): Payer: Medicaid Other

## 2017-05-30 ENCOUNTER — Inpatient Hospital Stay (HOSPITAL_COMMUNITY)
Admission: AD | Admit: 2017-05-30 | Discharge: 2017-05-30 | Disposition: A | Discharge status: Home / Self Care | Payer: Medicaid Other | Source: Ambulatory Visit | Attending: Obstetrics & Gynecology | Admitting: Obstetrics & Gynecology

## 2017-05-30 DIAGNOSIS — O409XX0 Polyhydramnios, unspecified trimester, not applicable or unspecified: Secondary | ICD-10-CM | POA: Diagnosis present

## 2017-05-30 DIAGNOSIS — W109XXA Fall (on) (from) unspecified stairs and steps, initial encounter: Secondary | ICD-10-CM | POA: Insufficient documentation

## 2017-05-30 DIAGNOSIS — Z3A25 25 weeks gestation of pregnancy: Secondary | ICD-10-CM | POA: Insufficient documentation

## 2017-05-30 DIAGNOSIS — O209 Hemorrhage in early pregnancy, unspecified: Secondary | ICD-10-CM | POA: Insufficient documentation

## 2017-05-30 DIAGNOSIS — Z880 Allergy status to penicillin: Secondary | ICD-10-CM | POA: Diagnosis not present

## 2017-05-30 DIAGNOSIS — N939 Abnormal uterine and vaginal bleeding, unspecified: Secondary | ICD-10-CM

## 2017-05-30 DIAGNOSIS — Z87891 Personal history of nicotine dependence: Secondary | ICD-10-CM | POA: Diagnosis not present

## 2017-05-30 DIAGNOSIS — O35EXX0 Maternal care for other (suspected) fetal abnormality and damage, fetal genitourinary anomalies, not applicable or unspecified: Secondary | ICD-10-CM

## 2017-05-30 DIAGNOSIS — O402XX0 Polyhydramnios, second trimester, not applicable or unspecified: Secondary | ICD-10-CM | POA: Diagnosis not present

## 2017-05-30 DIAGNOSIS — O358XX0 Maternal care for other (suspected) fetal abnormality and damage, not applicable or unspecified: Secondary | ICD-10-CM | POA: Diagnosis not present

## 2017-05-30 DIAGNOSIS — O4692 Antepartum hemorrhage, unspecified, second trimester: Secondary | ICD-10-CM | POA: Diagnosis present

## 2017-05-30 DIAGNOSIS — Z3400 Encounter for supervision of normal first pregnancy, unspecified trimester: Secondary | ICD-10-CM

## 2017-05-30 LAB — WET PREP, GENITAL
CLUE CELLS WET PREP: NONE SEEN
SPERM: NONE SEEN
TRICH WET PREP: NONE SEEN
YEAST WET PREP: NONE SEEN

## 2017-05-30 LAB — GC/CHLAMYDIA PROBE AMP (~~LOC~~) NOT AT ARMC
Chlamydia: NEGATIVE
Neisseria Gonorrhea: NEGATIVE

## 2017-05-30 MED ORDER — RHO D IMMUNE GLOBULIN 1500 UNIT/2ML IJ SOSY
300.0000 ug | PREFILLED_SYRINGE | Freq: Once | INTRAMUSCULAR | Status: AC
  Administered 2017-05-30: 300 ug via INTRAMUSCULAR
  Filled 2017-05-30: qty 2

## 2017-05-30 NOTE — Discharge Instructions (Signed)
Polyhydramnios When a woman becomes pregnant, a sac is formed around the fertilized egg (embryo) and later the growing baby (fetus). This sac is called the amniotic sac. The amniotic sac is filled with fluid. It gets bigger as the pregnancy grows. When there is too much fluid in the sac, it is called polyhydramnios. All babies born with polyhydramnios should be checked for congenital abnormalities. The amniotic fluid cushions and protects the baby. It also provides the baby with fluids and is crucial to normal development. Your baby breathes this fluid into its lungs and swallows it. This helps promote the healthy growth of the lungs and gastrointestinal tract. Amniotic fluid also helps the baby move around, helping with the normal development of muscle and bone. What are the causes? This condition is caused by:  Diabetes mellitus.  Down's syndrome, fetal abnormalities of the intestinal tract, and anencephaly (fetus without brain), all of which can prevent the fetus from swallowing the amniotic fluid.  One twins passes (transfuses) its blood into the other twin (twin-twin transfusion syndrome).  Medical illness of the mother, e.g., kidney or heart disease.  Tumor (chorioangioma) of the placenta.  What are the signs or symptoms? Symptoms of this condition include:  Enlarged uterus. The size of the uterus enlarges beyond what it should be for that particular time of the pregnancy.  Pressure and discomfort. The mother may feel more pressure and discomfort than should be expected.  Quick and unexpected enlargement of the mother's stomach.  How is this diagnosed? This condition is diagnosed when your health care provider measures you and notices that your uterus is beyond the size that is consistent with the time of the pregnancy.  An ultrasound is then used (abdominally or vaginally) to: ? See if you are carrying twins or more. ? Measure the growth of the baby. ? Look for birth  defects. ? Measure the amount of fluid in the amniotic sac.  Amniotic Fluid Index (AFI) measures the amount of fluid in the amniotic sac in four different areas. If there is more than 24 centimeters, you have polyhydramnios. How is this treated? This condition may be treated by:  Removing some fluid from the amniotic sac.  Taking medications that lower the fluids in your body.  Stopping the use of salt or salty foods because it causes you to keep fluid in your body (retention).  Follow these instructions at home:  Keep all your prenatal visits as told by your health care provider. It is important to follow your health care provider's recommendations.  Do not eat a lot of salt and salty foods.  If you have diabetes, keep it under control.  If you have heart or kidney disease, treat the disease as told by your health care provider. Contact a health care provider if:  You think your uterus has grown too fast in a short period of time.  You feel a great amount of pressure in your lower belly (pelvis) and are more uncomfortable than expected. Get help right away if:  You have a gush of fluid or are leaking fluid from your vagina.  You stop feeling the baby move.  You do not feel the baby kicking as much as usual.  You have a hard time keeping your diabetes under control.  You are having problems with your heart or kidney disease. This information is not intended to replace advice given to you by your health care provider. Make sure you discuss any questions you have with your health   care provider. Document Released: 04/09/2002 Document Revised: 06/25/2015 Document Reviewed: 08/16/2012 Elsevier Interactive Patient Education  2018 [employer]  

## 2017-05-30 NOTE — MAU Provider Note (Addendum)
History   Sherry Leach is a 22 yo female G1P0 that presents today for vaginal bleeding and cramping.   Yesterday had a fall down the stairs that resulted in some back pain and some cramping.   Vaginal bleeding this morning. Found a small amount on paper after wiping after urinating. She describes the bleeding as "A mild period". She used one pad since this morning when it was noticed and that pad was not soaked when she removed it this afternoon. She thinks this bleeding is from "them checking my cervix yesterday". She states that she still feels active fetal movements. She has not had any recent sexual intercourse. She is constipated frequently but she denies painful bowel movement, hematochezia, or melena.  She denies abdominal pain, vaginal pain/pressure or discharge, vaginal itching/burning, hematuria, dysuria, or diarrhea.  The cramping has been going on since the fall yesterday and is unchanged since her visit at the MAU yesterday. It is sharp pain that is intermittent. She denies any gush of fluid or constant leaking.   She also still has some back pain from the fall yesterday. She states that is is just soreness from the fall.    CSN: [id number]  Arrival date and time: 05/30/17 1130   None     Chief Complaint  Patient presents with  . Vaginal Bleeding   HPI  OB History    Gravida  1   Para      Term      Preterm      AB      Living  0     SAB      TAB      Ectopic      Multiple      Live Births              Past Medical History:  Diagnosis Date  . Medical history non-contributory     Past Surgical History:  Procedure Laterality Date  . MOUTH SURGERY      Family History  Problem Relation Age of Onset  . Hypertension Mother   . Hypertension Other     Social History   Tobacco Use  . Smoking status: Former Smoker    Packs/day: 0.50  . Smokeless tobacco: Never Used  Substance Use Topics  . Alcohol use: No  . Drug use: No    Allergies:   Allergies  Allergen Reactions  . Amoxicillin Rash    Has patient had a PCN reaction causing immediate rash, facial/tongue/throat swelling, SOB or lightheadedness with hypotension: No Has patient had a PCN reaction causing severe rash involving mucus membranes or skin necrosis: No Has patient had a PCN reaction that required hospitalization: No Has patient had a PCN reaction occurring within the last 10 years: Yes If all of the above answers are "NO", then may proceed with Cephalosporin use.   Marland Kitchen Penicillins Rash    Has patient had a PCN reaction causing immediate rash, facial/tongue/throat swelling, SOB or lightheadedness with hypotension: No Has patient had a PCN reaction causing severe rash involving mucus membranes or skin necrosis: No Has patient had a PCN reaction that required hospitalization: No Has patient had a PCN reaction occurring within the last 10 years: Yes If all of the above answers are "NO", then may proceed with Cephalosporin use.     Medications Prior to Admission  Medication Sig Dispense Refill Last Dose  . acetaminophen (TYLENOL) 325 MG tablet Take 325 mg by mouth every 6 (six) hours as needed  for mild pain.   Past Week at Unknown time  . Prenatal Vit-Fe Fumarate-FA (PRENATAL MULTIVITAMIN) TABS tablet Take 1 tablet by mouth daily at 12 noon.   Past Week at Unknown time    Review of Systems  Constitutional: Negative for appetite change, chills, fatigue and fever.  HENT: Negative for nosebleeds.   Eyes: Negative for visual disturbance.  Respiratory: Negative.   Cardiovascular: Negative for chest pain, palpitations and leg swelling.  Gastrointestinal: Positive for constipation. Negative for abdominal distention, abdominal pain, anal bleeding, blood in stool, diarrhea, nausea and vomiting.  Genitourinary: Positive for vaginal bleeding. Negative for difficulty urinating, dysuria, flank pain, frequency, hematuria, urgency, vaginal discharge and vaginal pain.   Musculoskeletal: Positive for back pain. Negative for neck pain.  Neurological: Negative for dizziness, syncope, light-headedness and headaches.   Physical Exam   Blood pressure 116/68, pulse 83, temperature 98.3 F (36.8 C), temperature source Oral, resp. rate 15, height 5' 8.5" (1.74 m), weight 74.8 kg (165 lb), last menstrual period 12/01/2016, SpO2 98 %.  Physical Exam  Constitutional: She is oriented to person, place, and time. She appears well-developed and well-nourished. No distress.  HENT:  Head: Normocephalic and atraumatic.  Cardiovascular: Normal rate, regular rhythm, normal heart sounds and intact distal pulses. Exam reveals no gallop and no friction rub.  No murmur heard. Respiratory: Effort normal and breath sounds normal.  GI: Soft. Bowel sounds are normal. There is no tenderness. There is no rebound and no guarding.  Genitourinary: There is no rash, tenderness, lesion or injury on the right labia. There is no rash, tenderness, lesion or injury on the left labia. Cervix exhibits no discharge and no friability. No erythema, tenderness or bleeding in the vagina. No foreign body in the vagina. No signs of injury around the vagina. Vaginal discharge found.  Genitourinary Comments: Vaginal discharge- White, non odorous   Neurological: She is alert and oriented to person, place, and time.  Skin: Skin is warm and dry. She is not diaphoretic.    MAU Course  Procedures   NST: Fetal HR:140  Accelerations: Present Decelerations: Not Present    MDM Results for orders placed or performed during the hospital encounter of 05/30/17 (from the past 24 hour(s))  Wet prep, genital     Status: Abnormal   Collection Time: 05/30/17 12:25 PM  Result Value Ref Range   Yeast Wet Prep HPF POC NONE SEEN NONE SEEN   Trich, Wet Prep NONE SEEN NONE SEEN   Clue Cells Wet Prep HPF POC NONE SEEN NONE SEEN   WBC, Wet Prep HPF POC MANY (A) NONE SEEN   Sperm NONE SEEN   Rh IG workup (includes  ABO/Rh)     Status: None (Preliminary result)   Collection Time: 05/30/17  1:57 PM  Result Value Ref Range   Gestational Age(Wks) 25    ABO/RH(D) O NEG    Antibody Screen NEG    Fetal Screen NEG    Unit Number [id number]    Blood Component Type RHIG    Unit division 00    Status of Unit ISSUED    Transfusion Status      OK TO TRANSFUSE Performed at [facility], [address] [place], [place] [address]    Meds ordered this encounter  Medications  . rho (d) immune globulin (RHIG/RHOPHYLAC) injection 300 mcg      Assessment and Plan  Assessment  1) Polyhydramnios  2) Single IUP [redacted]w[redacted]d Rh neg (RhoGRAM given today 05/30/2017) 3) Vaginal bleeding  in second trimester of pregnancy  4)  Back pain  Plan  Discharge Home, Patient Stable  Follow up U/S for Polyhydramnios in 4 weeks Follow up with prenatal care provider on the  May 13th  Preterm Labor precautions  Beverly Sessions 05/30/2017, 3:14 PM   CNM attestation:  I have seen and examined this patient and agree with above documentation in the PA student's note.   Sherry Leach is a 22 y.o. G1P0 at [redacted]w[redacted]d reporting vaginal bleeding which she thinks is related to her cervical exam yesterday.  +FM, denies LOF, contractions, vaginal discharge.  PE: Patient Vitals for the past 24 hrs:  BP Temp Temp src Pulse Resp SpO2 Height Weight  05/30/17 1201 116/68 98.3 F (36.8 C) Oral 83 15 98 % 5' 8.5" (1.74 m) 165 lb (74.8 kg)   Gen: calm comfortable, NAD Resp: normal effort, no distress Heart: Regular rate Abd: Soft, NT, gravid, S=D  FHR: Baseline 150,  Mod Variability, pos accels, no decels Toco: UC's none  ROS, labs, PMH reviewed  Orders Placed This Encounter  Procedures  . Wet prep, genital  . [place] MFM OB Limited  . [place] MFM OB Follow Up  . Rh IG workup (includes ABO/Rh)  . Discharge patient Discharge disposition: 01-Home or Self Care; Discharge patient date: 05/30/2017   Meds ordered this encounter  Medications   . rho (d) immune globulin (RHIG/RHOPHYLAC) injection 300 mcg    MDM -Rhogam given today due to vaginal bleeding. Patient has had no active bleeding whil e in MAU -NST: 150 bpm, mod var, present acel, neg decels.  -US shows polyhydramnios and no signs of abruption. Will order [place] follow-up for renal pyelactasis and to reassess fluid levels in 4 weeks.  -wet prep negative   Assessment: 1. Polyhydramnios, antepartum, single or unspecified fetus   2. Pyelectasis of fetus on prenatal ultrasound   3. Supervision of normal first pregnancy, antepartum   4. Vaginal bleeding     Plan: - Discharge home in stable condition. - Follow-up as scheduled at your doctor's office for next prenatal visit or sooner as needed if symptoms worsen. - Return to maternity admissions symptoms worsen   Marylene Land, [facility] 05/30/2017 3:34 PM

## 2017-05-30 NOTE — MAU Note (Signed)
Pt states she was seen yesterday after a fall, was contracting some then. This am she noted some bleeding on tissue when she wiped. Denies pain at this time.

## 2017-05-30 NOTE — MAU Note (Signed)
Urine sent to lab 

## 2017-05-31 LAB — RH IG WORKUP (INCLUDES ABO/RH)
ABO/RH(D): O NEG
Antibody Screen: NEGATIVE
Fetal Screen: NEGATIVE
GESTATIONAL AGE(WKS): 25
UNIT DIVISION: 0

## 2017-06-02 ENCOUNTER — Other Ambulatory Visit (HOSPITAL_COMMUNITY): Payer: Self-pay | Admitting: *Deleted

## 2017-06-02 DIAGNOSIS — O35EXX0 Maternal care for other (suspected) fetal abnormality and damage, fetal genitourinary anomalies, not applicable or unspecified: Secondary | ICD-10-CM

## 2017-06-02 DIAGNOSIS — O358XX0 Maternal care for other (suspected) fetal abnormality and damage, not applicable or unspecified: Secondary | ICD-10-CM

## 2017-06-12 ENCOUNTER — Encounter: Payer: Self-pay | Admitting: Advanced Practice Midwife

## 2017-06-12 ENCOUNTER — Ambulatory Visit (INDEPENDENT_AMBULATORY_CARE_PROVIDER_SITE_OTHER): Payer: Medicaid Other | Admitting: Advanced Practice Midwife

## 2017-06-12 VITALS — BP 139/77 | HR 84 | Wt 170.1 lb

## 2017-06-12 DIAGNOSIS — O162 Unspecified maternal hypertension, second trimester: Secondary | ICD-10-CM

## 2017-06-12 DIAGNOSIS — Z3400 Encounter for supervision of normal first pregnancy, unspecified trimester: Secondary | ICD-10-CM

## 2017-06-12 LAB — POCT URINALYSIS DIP (DEVICE)
Bilirubin Urine: NEGATIVE
GLUCOSE, UA: NEGATIVE mg/dL
Hgb urine dipstick: NEGATIVE
Ketones, ur: NEGATIVE mg/dL
Nitrite: NEGATIVE
PH: 7 (ref 5.0–8.0)
PROTEIN: NEGATIVE mg/dL
Specific Gravity, Urine: 1.015 (ref 1.005–1.030)
UROBILINOGEN UA: 0.2 mg/dL (ref 0.0–1.0)

## 2017-06-12 NOTE — Patient Instructions (Signed)
Contraception Choices Contraception, also called birth control, refers to methods or devices that prevent pregnancy. Hormonal methods Contraceptive implant A contraceptive implant is a thin, plastic tube that contains a hormone. It is inserted into the upper part of the arm. It can remain in place for up to 3 years. Progestin-only injections Progestin-only injections are injections of progestin, a synthetic form of the hormone progesterone. They are given every 3 months by a health care provider. Birth control pills Birth control pills are pills that contain hormones that prevent pregnancy. They must be taken once a day, preferably at the same time each day. Birth control patch The birth control patch contains hormones that prevent pregnancy. It is placed on the skin and must be changed once a week for three weeks and removed on the fourth week. A prescription is needed to use this method of contraception. Vaginal ring A vaginal ring contains hormones that prevent pregnancy. It is placed in the vagina for three weeks and removed on the fourth week. After that, the process is repeated with a new ring. A prescription is needed to use this method of contraception. Emergency contraceptive Emergency contraceptives prevent pregnancy after unprotected sex. They come in pill form and can be taken up to 5 days after sex. They work best the sooner they are taken after having sex. Most emergency contraceptives are available without a prescription. This method should not be used as your only form of birth control. Barrier methods Female condom A female condom is a thin sheath that is worn over the penis during sex. Condoms keep sperm from going inside a woman's body. They can be used with a spermicide to increase their effectiveness. They should be disposed after a single use. Female condom A female condom is a soft, loose-fitting sheath that is put into the vagina before sex. The condom keeps sperm from going  inside a woman's body. They should be disposed after a single use. Diaphragm A diaphragm is a soft, dome-shaped barrier. It is inserted into the vagina before sex, along with a spermicide. The diaphragm blocks sperm from entering the uterus, and the spermicide kills sperm. A diaphragm should be left in the vagina for 6-8 hours after sex and removed within 24 hours. A diaphragm is prescribed and fitted by a health care provider. A diaphragm should be replaced every 1-2 years, after giving birth, after gaining more than 15 lb (6.8 kg), and after pelvic surgery. Cervical cap A cervical cap is a round, soft latex or plastic cup that fits over the cervix. It is inserted into the vagina before sex, along with spermicide. It blocks sperm from entering the uterus. The cap should be left in place for 6-8 hours after sex and removed within 48 hours. A cervical cap must be prescribed and fitted by a health care provider. It should be replaced every 2 years. Sponge A sponge is a soft, circular piece of polyurethane foam with spermicide on it. The sponge helps block sperm from entering the uterus, and the spermicide kills sperm. To use it, you make it wet and then insert it into the vagina. It should be inserted before sex, left in for at least 6 hours after sex, and removed and thrown away within 30 hours. Spermicides Spermicides are chemicals that kill or block sperm from entering the cervix and uterus. They can come as a cream, jelly, suppository, foam, or tablet. A spermicide should be inserted into the vagina with an applicator at least 10-15 minutes before   sex to allow time for it to work. The process must be repeated every time you have sex. Spermicides do not require a prescription. Intrauterine contraception Intrauterine device (IUD) An IUD is a T-shaped device that is put in a woman's uterus. There are two types:  Hormone IUD.This type contains progestin, a synthetic form of the hormone progesterone. This  type can stay in place for 3-5 years.  Copper IUD.This type is wrapped in copper wire. It can stay in place for 10 years.  Permanent methods of contraception Female tubal ligation In this method, a woman's fallopian tubes are sealed, tied, or blocked during surgery to prevent eggs from traveling to the uterus. Hysteroscopic sterilization In this method, a small, flexible insert is placed into each fallopian tube. The inserts cause scar tissue to form in the fallopian tubes and block them, so sperm cannot reach an egg. The procedure takes about 3 months to be effective. Another form of birth control must be used during those 3 months. Female sterilization This is a procedure to tie off the tubes that carry sperm (vasectomy). After the procedure, the man can still ejaculate fluid (semen). Natural planning methods Natural family planning In this method, a couple does not have sex on days when the woman could become pregnant. Calendar method This means keeping track of the length of each menstrual cycle, identifying the days when pregnancy can happen, and not having sex on those days. Ovulation method In this method, a couple avoids sex during ovulation. Symptothermal method This method involves not having sex during ovulation. The woman typically checks for ovulation by watching changes in her temperature and in the consistency of cervical mucus. Post-ovulation method In this method, a couple waits to have sex until after ovulation. Summary  Contraception, also called birth control, means methods or devices that prevent pregnancy.  Hormonal methods of contraception include implants, injections, pills, patches, vaginal rings, and emergency contraceptives.  Barrier methods of contraception can include female condoms, female condoms, diaphragms, cervical caps, sponges, and spermicides.  There are two types of IUDs (intrauterine devices). An IUD can be put in a woman's uterus to prevent pregnancy  for 3-5 years.  Permanent sterilization can be done through a procedure for males, females, or both.  Natural family planning methods involve not having sex on days when the woman could become pregnant. This information is not intended to replace advice given to you by your health care provider. Make sure you discuss any questions you have with your health care provider. Document Released: 01/17/2005 Document Revised: 02/20/2016 Document Reviewed: 02/20/2016 Elsevier Interactive Patient Education  2018 Else[employer] Inc[employer]  

## 2017-06-12 NOTE — Progress Notes (Signed)
   PRENATAL VISIT NOTE  Subjective:  Sherry Leach is a 22 y.o. G1P0 at 70w4dbeing seen today for ongoing prenatal care.  She is currently monitored for the following issues for this low-risk pregnancy and has Dry skin dermatitis; Dysmenorrhea; Anemia; Irregular menstrual cycle; Supervision of normal first pregnancy, antepartum; Pyelonephritis affecting pregnancy; Pyelectasis of fetus on prenatal ultrasound; and Polyhydramnios, antepartum complication on their problem list.  Patient reports no complaints.  Contractions: Not present. Vag. Bleeding: None.  Movement: Present. Denies leaking of fluid.   The following portions of the patient's history were reviewed and updated as appropriate: allergies, current medications, past family history, past medical history, past social history, past surgical history and problem list. Problem list updated.  Objective:   Vitals:   06/12/17 1404 06/12/17 1406  BP: (!) 144/89 139/77  Pulse: 93 84  Weight: 170 lb 1.6 oz (77.2 kg)     Fetal Status: Fetal Heart Rate (bpm): 145 Fundal Height: 27 cm Movement: Present     General:  Alert, oriented and cooperative. Patient is in no acute distress.  Skin: Skin is warm and dry. No rash noted.   Cardiovascular: Normal heart rate noted  Respiratory: Normal respiratory effort, no problems with respiration noted  Abdomen: Soft, gravid, appropriate for gestational age.  Pain/Pressure: Present     Pelvic: Cervical exam deferred        Extremities: Normal range of motion.  Edema: Trace  Mental Status: Normal mood and affect. Normal behavior. Normal judgment and thought content.   Assessment and Plan:  Pregnancy: G1P0 at 222w4d1. Elevated blood pressure complicating pregnancy in second trimester, antepartum - CBC - Comp Met (CMET) - Protein / creatinine ratio, urine - 1 week for B/P check and 2 hour GTT  2. Supervision of normal first pregnancy, antepartum   Preterm labor symptoms and general obstetric  precautions including but not limited to vaginal bleeding, contractions, leaking of fluid and fetal movement were reviewed in detail with the patient. Please refer to After Visit Summary for other counseling recommendations.  Return in about 1 week (around 06/19/2017) for blood pressure check and 2 hour GTT. . Marland KitchenFuture Appointments  Date Time Provider [place]5/24/2019  2:30 PM [facility] [place] [facility]    Heather Hogan, CNM

## 2017-06-13 LAB — CBC
HEMOGLOBIN: 9.4 g/dL — AB (ref 11.1–15.9)
Hematocrit: 28.7 % — ABNORMAL LOW (ref 34.0–46.6)
MCH: 29.1 pg (ref 26.6–33.0)
MCHC: 32.8 g/dL (ref 31.5–35.7)
MCV: 89 fL (ref 79–97)
PLATELETS: 220 10*3/uL (ref 150–379)
RBC: 3.23 x10E6/uL — ABNORMAL LOW (ref 3.77–5.28)
RDW: 13.1 % (ref 12.3–15.4)
WBC: 11.7 10*3/uL — ABNORMAL HIGH (ref 3.4–10.8)

## 2017-06-13 LAB — COMPREHENSIVE METABOLIC PANEL
ALBUMIN: 3.9 g/dL (ref 3.5–5.5)
ALK PHOS: 70 IU/L (ref 39–117)
ALT: 15 IU/L (ref 0–32)
AST: 18 IU/L (ref 0–40)
Albumin/Globulin Ratio: 1.4 (ref 1.2–2.2)
BUN / CREAT RATIO: 10 (ref 9–23)
BUN: 5 mg/dL — ABNORMAL LOW (ref 6–20)
Bilirubin Total: <0.2 mg/dL (ref 0.0–1.2)
CALCIUM: 9.9 mg/dL (ref 8.7–10.2)
CO2: 20 mmol/L (ref 20–29)
Chloride: 102 mmol/L (ref 96–106)
Creatinine, Ser: 0.52 mg/dL — ABNORMAL LOW (ref 0.57–1.00)
GFR calc non Af Amer: 137 mL/min/{1.73_m2} (ref >59)
GFR, EST AFRICAN AMERICAN: 158 mL/min/{1.73_m2} (ref >59)
GLUCOSE: 73 mg/dL (ref 65–99)
Globulin, Total: 2.8 g/dL (ref 1.5–4.5)
POTASSIUM: 3.6 mmol/L (ref 3.5–5.2)
SODIUM: 137 mmol/L (ref 134–144)
TOTAL PROTEIN: 6.7 g/dL (ref 6.0–8.5)

## 2017-06-13 LAB — PROTEIN / CREATININE RATIO, URINE
CREATININE, UR: 56.4 mg/dL
Protein, Ur: 10.4 mg/dL
Protein/Creat Ratio: 184 mg/g creat (ref 0–200)

## 2017-06-16 ENCOUNTER — Other Ambulatory Visit: Payer: Self-pay | Admitting: *Deleted

## 2017-06-16 DIAGNOSIS — Z3400 Encounter for supervision of normal first pregnancy, unspecified trimester: Secondary | ICD-10-CM

## 2017-06-19 ENCOUNTER — Other Ambulatory Visit (INDEPENDENT_AMBULATORY_CARE_PROVIDER_SITE_OTHER): Payer: Medicaid Other | Admitting: General Practice

## 2017-06-19 VITALS — BP 124/78 | HR 103 | Ht 69.0 in | Wt 172.0 lb

## 2017-06-19 DIAGNOSIS — Z0130 Encounter for examination of blood pressure without abnormal findings: Secondary | ICD-10-CM

## 2017-06-19 DIAGNOSIS — Z3400 Encounter for supervision of normal first pregnancy, unspecified trimester: Secondary | ICD-10-CM

## 2017-06-19 NOTE — Progress Notes (Signed)
Patient presents to office today for BP check following mildly elevated pressures at previous OB visit. Patient denies headaches, dizziness or blurry vision. No edema noted. Patient has next OB visit 6/10. BP is WNL today. Will follow up at next visit.

## 2017-06-19 NOTE — Progress Notes (Signed)
I have reviewed the chart and agree with nursing staff's documentation of this patient's encounter.  Thressa Sheller, CNM 06/19/2017 12:10 PM

## 2017-06-20 LAB — GLUCOSE TOLERANCE, 2 HOURS W/ 1HR
GLUCOSE, 2 HOUR: 125 mg/dL (ref 65–152)
GLUCOSE, FASTING: 82 mg/dL (ref 65–91)
Glucose, 1 hour: 144 mg/dL (ref 65–179)

## 2017-06-20 LAB — CBC
Hematocrit: 27.7 % — ABNORMAL LOW (ref 34.0–46.6)
Hemoglobin: 8.7 g/dL — ABNORMAL LOW (ref 11.1–15.9)
MCH: 28 pg (ref 26.6–33.0)
MCHC: 31.4 g/dL — ABNORMAL LOW (ref 31.5–35.7)
MCV: 89 fL (ref 79–97)
PLATELETS: 184 10*3/uL (ref 150–450)
RBC: 3.11 x10E6/uL — AB (ref 3.77–5.28)
RDW: 13.5 % (ref 12.3–15.4)
WBC: 11.7 10*3/uL — AB (ref 3.4–10.8)

## 2017-06-20 LAB — RPR: RPR Ser Ql: NONREACTIVE

## 2017-06-20 LAB — HIV ANTIBODY (ROUTINE TESTING W REFLEX): HIV SCREEN 4TH GENERATION: NONREACTIVE

## 2017-06-23 ENCOUNTER — Ambulatory Visit (HOSPITAL_COMMUNITY)
Admission: RE | Admit: 2017-06-23 | Discharge: 2017-06-23 | Disposition: A | Discharge status: Home / Self Care | Payer: Medicaid Other | Source: Ambulatory Visit | Attending: Advanced Practice Midwife | Admitting: Advanced Practice Midwife

## 2017-06-23 ENCOUNTER — Other Ambulatory Visit (HOSPITAL_COMMUNITY): Payer: Self-pay | Admitting: Obstetrics and Gynecology

## 2017-06-23 ENCOUNTER — Other Ambulatory Visit (HOSPITAL_COMMUNITY): Payer: Self-pay | Admitting: *Deleted

## 2017-06-23 ENCOUNTER — Encounter (HOSPITAL_COMMUNITY): Payer: Self-pay

## 2017-06-23 DIAGNOSIS — O35HXX0 Maternal care for other (suspected) fetal abnormality and damage, fetal lower extremities anomalies, not applicable or unspecified: Secondary | ICD-10-CM

## 2017-06-23 DIAGNOSIS — O358XX0 Maternal care for other (suspected) fetal abnormality and damage, not applicable or unspecified: Secondary | ICD-10-CM | POA: Diagnosis not present

## 2017-06-23 DIAGNOSIS — Z3A29 29 weeks gestation of pregnancy: Secondary | ICD-10-CM

## 2017-06-23 DIAGNOSIS — O35EXX0 Maternal care for other (suspected) fetal abnormality and damage, fetal genitourinary anomalies, not applicable or unspecified: Secondary | ICD-10-CM

## 2017-06-23 DIAGNOSIS — Z3400 Encounter for supervision of normal first pregnancy, unspecified trimester: Secondary | ICD-10-CM

## 2017-06-23 DIAGNOSIS — Z362 Encounter for other antenatal screening follow-up: Secondary | ICD-10-CM | POA: Insufficient documentation

## 2017-07-10 ENCOUNTER — Other Ambulatory Visit (HOSPITAL_COMMUNITY)
Admission: RE | Admit: 2017-07-10 | Discharge: 2017-07-10 | Disposition: A | Discharge status: Home / Self Care | Payer: Medicaid Other | Source: Ambulatory Visit | Attending: Advanced Practice Midwife | Admitting: Advanced Practice Midwife

## 2017-07-10 ENCOUNTER — Ambulatory Visit (INDEPENDENT_AMBULATORY_CARE_PROVIDER_SITE_OTHER): Payer: Medicaid Other | Admitting: Advanced Practice Midwife

## 2017-07-10 VITALS — BP 134/74 | HR 80 | Wt 178.8 lb

## 2017-07-10 DIAGNOSIS — Z202 Contact with and (suspected) exposure to infections with a predominantly sexual mode of transmission: Secondary | ICD-10-CM

## 2017-07-10 DIAGNOSIS — O98819 Other maternal infectious and parasitic diseases complicating pregnancy, unspecified trimester: Secondary | ICD-10-CM

## 2017-07-10 DIAGNOSIS — O98813 Other maternal infectious and parasitic diseases complicating pregnancy, third trimester: Secondary | ICD-10-CM

## 2017-07-10 DIAGNOSIS — Z3403 Encounter for supervision of normal first pregnancy, third trimester: Secondary | ICD-10-CM | POA: Diagnosis not present

## 2017-07-10 DIAGNOSIS — A749 Chlamydial infection, unspecified: Secondary | ICD-10-CM

## 2017-07-10 DIAGNOSIS — A568 Sexually transmitted chlamydial infection of other sites: Secondary | ICD-10-CM | POA: Insufficient documentation

## 2017-07-10 NOTE — Progress Notes (Signed)
   PRENATAL VISIT NOTE  Subjective:  Sherry Leach is a 22 y.o. G1P0 at 4366w4d being seen today for ongoing prenatal care.  She is currently monitored for the following issues for this low-risk pregnancy and has Dry skin dermatitis; Dysmenorrhea; Anemia; Irregular menstrual cycle; Supervision of normal first pregnancy, antepartum; Pyelonephritis affecting pregnancy; Pyelectasis of fetus on prenatal ultrasound; Polyhydramnios, antepartum complication; [redacted] weeks gestation of pregnancy; Encounter for other antenatal screening follow-up; and Chlamydia infection affecting pregnancy on their problem list.  Patient reports headache. Has tried 500 mg Tylenol, ice pack to forehead with no improvement. Contractions: Not present. Vag. Bleeding: None.  Movement: Present. Denies leaking of fluid.   The following portions of the patient's history were reviewed and updated as appropriate: allergies, current medications, past family history, past medical history, past social history, past surgical history and problem list. Problem list updated.  Objective:   Vitals:   07/10/17 1445  BP: 134/74  Pulse: 80  Weight: 178 lb 12.8 oz (81.1 kg)    Fetal Status: Fetal Heart Rate (bpm): 152 Fundal Height: 31 cm Movement: Present     General:  Alert, oriented and cooperative. Patient is in no acute distress.  Skin: Skin is warm and dry. No rash noted.   Cardiovascular: Normal heart rate noted  Respiratory: Normal respiratory effort, no problems with respiration noted  Abdomen: Soft, gravid, appropriate for gestational age.  Pain/Pressure: Present     Pelvic: Cervical exam deferred        Extremities: Normal range of motion.  Edema: Trace  Mental Status: Normal mood and affect. Normal behavior. Normal judgment and thought content.   Assessment and Plan:  Pregnancy: G1P0 at 1866w4d  1. Chlamydia infection affecting pregnancy, antepartum   2. Encounter for supervision of normal first pregnancy in third  trimester - Reviewed safe medications and treatment for headache in pregnancy including but not limited to Unisom, Benadryl, increasing PO hydration with water and ocular rest  3. Exposure to sexually transmitted disease (STD) - Possible new exposure to STI - Cervicovaginal ancillary only - Hepatitis B surface antibody - Hepatitis C Antibody  Preterm labor symptoms and general obstetric precautions including but not limited to vaginal bleeding, contractions, leaking of fluid and fetal movement were reviewed in detail with the patient. Please refer to After Visit Summary for other counseling recommendations.  No follow-ups on file.  Future Appointments  Date Time Provider Department Center  08/04/2017  1:00 PM [facility] US 3 [facility]    Calvert CantorSamantha C Eagan Shifflett, CNM  07/10/17  3:08 PM

## 2017-07-10 NOTE — Patient Instructions (Signed)
Braxton Hicks Contractions °Contractions of the uterus can occur throughout pregnancy, but they are not always a sign that you are in labor. You may have practice contractions called Braxton Hicks contractions. These false labor contractions are sometimes confused with true labor. °What are Braxton Hicks contractions? °Braxton Hicks contractions are tightening movements that occur in the muscles of the uterus before labor. Unlike true labor contractions, these contractions do not result in opening (dilation) and thinning of the cervix. Toward the end of pregnancy (32-34 weeks), Braxton Hicks contractions can happen more often and may become stronger. These contractions are sometimes difficult to tell apart from true labor because they can be very uncomfortable. You should not feel embarrassed if you go to the hospital with false labor. °Sometimes, the only way to tell if you are in true labor is for your health care provider to look for changes in the cervix. The health care provider will do a physical exam and may monitor your contractions. If you are not in true labor, the exam should show that your cervix is not dilating and your water has not broken. °If there are other health problems associated with your pregnancy, it is completely safe for you to be sent home with false labor. You may continue to have Braxton Hicks contractions until you go into true labor. °How to tell the difference between true labor and false labor °True labor °· Contractions last 30-70 seconds. °· Contractions become very regular. °· Discomfort is usually felt in the top of the uterus, and it spreads to the lower abdomen and low back. °· Contractions do not go away with walking. °· Contractions usually become more intense and increase in frequency. °· The cervix dilates and gets thinner. °False labor °· Contractions are usually shorter and not as strong as true labor contractions. °· Contractions are usually irregular. °· Contractions  are often felt in the front of the lower abdomen and in the groin. °· Contractions may go away when you walk around or change positions while lying down. °· Contractions get weaker and are shorter-lasting as time goes on. °· The cervix usually does not dilate or become thin. °Follow these instructions at home: °· Take over-the-counter and prescription medicines only as told by your health care provider. °· Keep up with your usual exercises and follow other instructions from your health care provider. °· Eat and drink lightly if you think you are going into labor. °· If Braxton Hicks contractions are making you uncomfortable: °? Change your position from lying down or resting to walking, or change from walking to resting. °? Sit and rest in a tub of warm water. °? Drink enough fluid to keep your urine pale yellow. Dehydration may cause these contractions. °? Do slow and deep breathing several times an hour. °· Keep all follow-up prenatal visits as told by your health care provider. This is important. °Contact a health care provider if: °· You have a fever. °· You have continuous pain in your abdomen. °Get help right away if: °· Your contractions become stronger, more regular, and closer together. °· You have fluid leaking or gushing from your vagina. °· You pass blood-tinged mucus (bloody show). °· You have bleeding from your vagina. °· You have low back pain that you never had before. °· You feel your baby’s head pushing down and causing pelvic pressure. °· Your baby is not moving inside you as much as it used to. °Summary °· Contractions that occur before labor are called Braxton   Hicks contractions, false labor, or practice contractions. °· Braxton Hicks contractions are usually shorter, weaker, farther apart, and less regular than true labor contractions. True labor contractions usually become progressively stronger and regular and they become more frequent. °· Manage discomfort from Braxton Hicks contractions by  changing position, resting in a warm bath, drinking plenty of water, or practicing deep breathing. °This information is not intended to replace advice given to you by your health care provider. Make sure you discuss any questions you have with your health care provider. °Document Released: 06/02/2016 Document Revised: 06/02/2016 Document Reviewed: 06/02/2016 °Elsevier Interactive Patient Education © 2018 Elsevier Inc. ° °

## 2017-07-11 LAB — CERVICOVAGINAL ANCILLARY ONLY
Bacterial vaginitis: NEGATIVE
CHLAMYDIA, DNA PROBE: NEGATIVE
NEISSERIA GONORRHEA: NEGATIVE
TRICH (WINDOWPATH): NEGATIVE

## 2017-07-11 LAB — HEPATITIS C ANTIBODY

## 2017-07-11 LAB — HEPATITIS B SURFACE ANTIBODY, QUANTITATIVE

## 2017-07-21 ENCOUNTER — Encounter (HOSPITAL_COMMUNITY): Payer: Self-pay

## 2017-07-21 ENCOUNTER — Inpatient Hospital Stay (HOSPITAL_COMMUNITY)
Admission: AD | Admit: 2017-07-21 | Discharge: 2017-07-21 | Disposition: A | Discharge status: Home / Self Care | Payer: Medicaid Other | Source: Ambulatory Visit | Attending: Obstetrics & Gynecology | Admitting: Obstetrics & Gynecology

## 2017-07-21 DIAGNOSIS — Z3689 Encounter for other specified antenatal screening: Secondary | ICD-10-CM

## 2017-07-21 DIAGNOSIS — Z3A33 33 weeks gestation of pregnancy: Secondary | ICD-10-CM | POA: Insufficient documentation

## 2017-07-21 DIAGNOSIS — O26893 Other specified pregnancy related conditions, third trimester: Secondary | ICD-10-CM | POA: Diagnosis not present

## 2017-07-21 DIAGNOSIS — R51 Headache: Secondary | ICD-10-CM | POA: Insufficient documentation

## 2017-07-21 DIAGNOSIS — Z880 Allergy status to penicillin: Secondary | ICD-10-CM | POA: Diagnosis not present

## 2017-07-21 DIAGNOSIS — M549 Dorsalgia, unspecified: Secondary | ICD-10-CM | POA: Diagnosis not present

## 2017-07-21 DIAGNOSIS — O9989 Other specified diseases and conditions complicating pregnancy, childbirth and the puerperium: Secondary | ICD-10-CM

## 2017-07-21 DIAGNOSIS — Z792 Long term (current) use of antibiotics: Secondary | ICD-10-CM | POA: Diagnosis not present

## 2017-07-21 DIAGNOSIS — Z8249 Family history of ischemic heart disease and other diseases of the circulatory system: Secondary | ICD-10-CM | POA: Diagnosis not present

## 2017-07-21 DIAGNOSIS — O99891 Other specified diseases and conditions complicating pregnancy: Secondary | ICD-10-CM

## 2017-07-21 DIAGNOSIS — Z79899 Other long term (current) drug therapy: Secondary | ICD-10-CM | POA: Diagnosis not present

## 2017-07-21 HISTORY — DX: Other seasonal allergic rhinitis: J30.2

## 2017-07-21 LAB — URINALYSIS, ROUTINE W REFLEX MICROSCOPIC
BILIRUBIN URINE: NEGATIVE
BILIRUBIN URINE: NEGATIVE
Glucose, UA: NEGATIVE mg/dL
Glucose, UA: NEGATIVE mg/dL
HGB URINE DIPSTICK: NEGATIVE
HGB URINE DIPSTICK: NEGATIVE
Ketones, ur: NEGATIVE mg/dL
Ketones, ur: NEGATIVE mg/dL
Leukocytes, UA: NEGATIVE
NITRITE: NEGATIVE
NITRITE: NEGATIVE
PH: 8 (ref 5.0–8.0)
PH: 8 (ref 5.0–8.0)
Protein, ur: NEGATIVE mg/dL
Protein, ur: NEGATIVE mg/dL
SPECIFIC GRAVITY, URINE: 1.009 (ref 1.005–1.030)
SPECIFIC GRAVITY, URINE: 1.01 (ref 1.005–1.030)

## 2017-07-21 MED ORDER — ONDANSETRON 4 MG PO TBDP
4.0000 mg | ORAL_TABLET | Freq: Three times a day (TID) | ORAL | Status: DC | PRN
  Administered 2017-07-21: 4 mg via ORAL
  Filled 2017-07-21: qty 1

## 2017-07-21 MED ORDER — CYCLOBENZAPRINE HCL 10 MG PO TABS: 10.0000 mg | ORAL_TABLET | Freq: Three times a day (TID) | ORAL | 0 refills | Status: DC | PRN

## 2017-07-21 MED ORDER — CYCLOBENZAPRINE HCL 10 MG PO TABS
10.0000 mg | ORAL_TABLET | Freq: Three times a day (TID) | ORAL | Status: DC | PRN
  Administered 2017-07-21: 10 mg via ORAL
  Filled 2017-07-21: qty 1

## 2017-07-21 NOTE — MAU Provider Note (Signed)
History     CSN: [id number]  Arrival date and time: 07/21/17 4098  Chief Complaint  Patient presents with  . Back Pain  . Headache  . Emesis   G1 @33 .1 wks here with LBP, nausea, and HA. Sx started 3 days ago. LBP is bilateral but worse on left. Rates 8/10. Has not taken anything for it. Denies dysuria and hematuria. No fevers. Emesis x3 since yesterday. Hx of pyelo 3 mos ago and she feels this is similar in sx. Also reports waking this am with wet underwear. Could not tell if had color. None since. Denies VB and ctx. +FM. Her pregnancy has been complicated by pyelo, poly, and Chlamydia.   OB History    Gravida  1   Para      Term      Preterm      AB      Living  0     SAB      TAB      Ectopic      Multiple      Live Births              Past Medical History:  Diagnosis Date  . Medical history non-contributory   . Seasonal allergies     Past Surgical History:  Procedure Laterality Date  . MOUTH SURGERY      Family History  Problem Relation Age of Onset  . Hypertension Mother   . Hypertension Other     Social History   Tobacco Use  . Smoking status: Former Smoker    Packs/day: 0.50  . Smokeless tobacco: Never Used  Substance Use Topics  . Alcohol use: No  . Drug use: No    Allergies:  Allergies  Allergen Reactions  . Amoxicillin Rash    Has patient had a PCN reaction causing immediate rash, facial/tongue/throat swelling, SOB or lightheadedness with hypotension: No Has patient had a PCN reaction causing severe rash involving mucus membranes or skin necrosis: No Has patient had a PCN reaction that required hospitalization: No Has patient had a PCN reaction occurring within the last 10 years: Yes If all of the above answers are "NO", then may proceed with Cephalosporin use.   Marland Kitchen Penicillins Rash    Has patient had a PCN reaction causing immediate rash, facial/tongue/throat swelling, SOB or lightheadedness with hypotension: No Has  patient had a PCN reaction causing severe rash involving mucus membranes or skin necrosis: No Has patient had a PCN reaction that required hospitalization: No Has patient had a PCN reaction occurring within the last 10 years: Yes If all of the above answers are "NO", then may proceed with Cephalosporin use.     No medications prior to admission.    Review of Systems  Constitutional: Negative for chills and fever.  Gastrointestinal: Positive for nausea and vomiting.  Genitourinary: Positive for urgency and vaginal discharge. Negative for dysuria, hematuria and vaginal bleeding.  Musculoskeletal: Positive for back pain.  Neurological: Positive for headaches.   Physical Exam   Blood pressure 121/72, pulse 86, temperature 98 F (36.7 C), temperature source Oral, resp. rate 18, height 5\' 9"  (1.753 m), weight 181 lb (82.1 kg), last menstrual period 12/01/2016.  Physical Exam  Nursing note and vitals reviewed. Constitutional: She is oriented to person, place, and time. She appears well-developed and well-nourished. No distress.  HENT:  Head: Normocephalic and atraumatic.  Neck: Normal range of motion.  Respiratory: Effort normal. No respiratory distress.  GI: Soft. She exhibits no  distension. There is no tenderness. There is CVA tenderness (left).  gravid  Genitourinary:  Genitourinary Comments: SSE: +thick white discharge, no pool  Musculoskeletal: Normal range of motion.       Cervical back: Normal.       Thoracic back: Normal.       Lumbar back: Normal.  Neurological: She is alert and oriented to person, place, and time.  Skin: Skin is warm and dry.  Psychiatric: She has a normal mood and affect.  EFM: 135 bpm, mod variability, + accels, no decels Toco: none  Results for orders placed or performed during the hospital encounter of 07/21/17 (from the past 24 hour(s))  Urinalysis, Routine w reflex microscopic     Status: Abnormal   Collection Time: 07/21/17  9:04 AM  Result  Value Ref Range   Color, Urine YELLOW YELLOW   APPearance HAZY (A) CLEAR   Specific Gravity, Urine 1.009 1.005 - 1.030   pH 8.0 5.0 - 8.0   Glucose, UA NEGATIVE NEGATIVE mg/dL   Hgb urine dipstick NEGATIVE NEGATIVE   Bilirubin Urine NEGATIVE NEGATIVE   Ketones, ur NEGATIVE NEGATIVE mg/dL   Protein, ur NEGATIVE NEGATIVE mg/dL   Nitrite NEGATIVE NEGATIVE   Leukocytes, UA LARGE (A) NEGATIVE   RBC / HPF 0-5 0 - 5 RBC/hpf   WBC, UA 6-10 0 - 5 WBC/hpf   Bacteria, UA RARE (A) NONE SEEN   Squamous Epithelial / LPF 11-20 0 - 5   Mucus PRESENT   Urinalysis, Routine w reflex microscopic     Status: None   Collection Time: 07/21/17  9:40 AM  Result Value Ref Range   Color, Urine YELLOW YELLOW   APPearance CLEAR CLEAR   Specific Gravity, Urine 1.010 1.005 - 1.030   pH 8.0 5.0 - 8.0   Glucose, UA NEGATIVE NEGATIVE mg/dL   Hgb urine dipstick NEGATIVE NEGATIVE   Bilirubin Urine NEGATIVE NEGATIVE   Ketones, ur NEGATIVE NEGATIVE mg/dL   Protein, ur NEGATIVE NEGATIVE mg/dL   Nitrite NEGATIVE NEGATIVE   Leukocytes, UA NEGATIVE NEGATIVE   MAU Course  Procedures Flexeril  MDM Labs ordered and reviewed. Recent STD screen was negative. UA contaminated, cath urine ordered and negative. No evidence of PTL, UTI or pyelo. Back pain improved after Flexeril. No emesis. Tolerating po. Stable for discharge home.   Assessment and Plan   1. [redacted] weeks gestation of pregnancy   2. NST (non-stress test) reactive   3. Back pain affecting pregnancy in third trimester    Discharge home Follow up in OB office as scheduled Heat prn Tylenol prn Rx Flexeril  Allergies as of 07/21/2017      Reactions   Amoxicillin Rash   Has patient had a PCN reaction causing immediate rash, facial/tongue/throat swelling, SOB or lightheadedness with hypotension: No Has patient had a PCN reaction causing severe rash involving mucus membranes or skin necrosis: No Has patient had a PCN reaction that required hospitalization:  No Has patient had a PCN reaction occurring within the last 10 years: Yes If all of the above answers are "NO", then may proceed with Cephalosporin use.   Penicillins Rash   Has patient had a PCN reaction causing immediate rash, facial/tongue/throat swelling, SOB or lightheadedness with hypotension: No Has patient had a PCN reaction causing severe rash involving mucus membranes or skin necrosis: No Has patient had a PCN reaction that required hospitalization: No Has patient had a PCN reaction occurring within the last 10 years: Yes If all of the  above answers are "NO", then may proceed with Cephalosporin use.      Medication List    TAKE these medications   acetaminophen 325 MG tablet Commonly known as:  TYLENOL Take 325 mg by mouth every 6 (six) hours as needed for mild pain.   cephALEXin 500 MG capsule Commonly known as:  KEFLEX Take 500 mg by mouth daily.   cyclobenzaprine 10 MG tablet Commonly known as:  FLEXERIL Take 1 tablet (10 mg total) by mouth 3 (three) times daily as needed for muscle spasms.   miconazole 2 % vaginal cream Commonly known as:  MONISTAT 7 Place 1 Applicatorful vaginally at bedtime.   prenatal multivitamin Tabs tablet Take 1 tablet by mouth daily at 12 noon.      Donette Larry, CNM 07/21/2017, 11:27 AM

## 2017-07-21 NOTE — MAU Note (Signed)
Pt C/O lower back pain, vomiting since yesterday, no diarrhea, has HA, feels very weak.  Having mild braxton hicks, denies bleeding or LOF.  Reports good fetal movement.

## 2017-07-21 NOTE — Discharge Instructions (Signed)
Back Pain in Pregnancy Back pain during pregnancy is common. Back pain may be caused by several factors that are related to changes during your pregnancy. Follow these instructions at home: Managing pain, stiffness, and swelling  If directed, apply ice for sudden (acute) back pain. ? Put ice in a plastic bag. ? Place a towel between your skin and the bag. ? Leave the ice on for 20 minutes, 2-3 times per day.  If directed, apply heat to the affected area before you exercise: ? Place a towel between your skin and the heat pack or heating pad. ? Leave the heat on for 20-30 minutes. ? Remove the heat if your skin turns bright red. This is especially important if you are unable to feel pain, heat, or cold. You may have a greater risk of getting burned. Activity  Exercise as told by your health care provider. Exercising is the best way to prevent or manage back pain.  Listen to your body when lifting. If lifting hurts, ask for help or bend your knees. This uses your leg muscles instead of your back muscles.  Squat down when picking up something from the floor. Do not bend over.  Only use bed rest as told by your health care provider. Bed rest should only be used for the most severe episodes of back pain. Standing, Sitting, and Lying Down  Do not stand in one place for long periods of time.  Use good posture when sitting. Make sure your head rests over your shoulders and is not hanging forward. Use a pillow on your lower back if necessary.  Try sleeping on your side, preferably the left side, with a pillow or two between your legs. If you are sore after a night's rest, your bed may be too soft. A firm mattress may provide more support for your back during pregnancy. General instructions  Do not wear high heels.  Eat a healthy diet. Try to gain weight within your health care provider's recommendations.  Use a maternity girdle, elastic sling, or back brace as told by your health care  provider.  Take over-the-counter and prescription medicines only as told by your health care provider.  Keep all follow-up visits as told by your health care provider. This is important. This includes any visits with any specialists, such as a physical therapist. Contact a health care provider if:  Your back pain interferes with your daily activities.  You have increasing pain in other parts of your body. Get help right away if:  You develop numbness, tingling, weakness, or problems with the use of your arms or legs.  You develop severe back pain that is not controlled with medicine.  You have a sudden change in bowel or bladder control.  You develop shortness of breath, dizziness, or you faint.  You develop nausea, vomiting, or sweating.  You have back pain that is a rhythmic, cramping pain similar to labor pains. Labor pain is usually 1-2 minutes apart, lasts for about 1 minute, and involves a bearing down feeling or pressure in your pelvis.  You have back pain and your water breaks or you have vaginal bleeding.  You have back pain or numbness that travels down your leg.  Your back pain developed after you fell.  You develop pain on one side of your back.  You see blood in your urine.  You develop skin blisters in the area of your back pain. This information is not intended to replace advice given to you   by your health care provider. Make sure you discuss any questions you have with your health care provider. Document Released: 04/27/2005 Document Revised: 06/25/2015 Document Reviewed: 10/01/2014 Elsevier Interactive Patient Education  2018 [employer]  

## 2017-07-21 NOTE — MAU Note (Signed)
Reports when she woke up this AM underwear was wet- unsure if urine/discharge/ROM. No leaking since.

## 2017-07-25 ENCOUNTER — Other Ambulatory Visit: Payer: Self-pay

## 2017-07-25 ENCOUNTER — Ambulatory Visit (INDEPENDENT_AMBULATORY_CARE_PROVIDER_SITE_OTHER): Payer: Medicaid Other | Admitting: Obstetrics & Gynecology

## 2017-07-25 VITALS — BP 112/79 | HR 76 | Wt 185.0 lb

## 2017-07-25 DIAGNOSIS — Z3400 Encounter for supervision of normal first pregnancy, unspecified trimester: Secondary | ICD-10-CM

## 2017-07-25 DIAGNOSIS — Z23 Encounter for immunization: Secondary | ICD-10-CM | POA: Diagnosis not present

## 2017-07-25 DIAGNOSIS — O409XX0 Polyhydramnios, unspecified trimester, not applicable or unspecified: Secondary | ICD-10-CM

## 2017-07-25 NOTE — Progress Notes (Signed)
   PRENATAL VISIT NOTE  Subjective:  Sherry Leach is a 22 y.o. G1P0 at 442w5d being seen today for ongoing prenatal care.  She is currently monitored for the following issues for this low-risk pregnancy and has Supervision of normal first pregnancy, antepartum; Pyelonephritis affecting pregnancy; Pyelectasis of fetus on prenatal ultrasound; Polyhydramnios, antepartum complication; and Chlamydia infection affecting pregnancy on their problem list.  Patient reports no complaints.  Contractions: Not present. Vag. Bleeding: None.  Movement: Present. Denies leaking of fluid.   The following portions of the patient's history were reviewed and updated as appropriate: allergies, current medications, past family history, past medical history, past social history, past surgical history and problem list. Problem list updated.  Objective:   Vitals:   07/25/17 1141  BP: 112/79  Pulse: 76  Weight: 185 lb (83.9 kg)    Fetal Status: Fetal Heart Rate (bpm): 145 Fundal Height: 33 cm Movement: Present     General:  Alert, oriented and cooperative. Patient is in no acute distress.  Skin: Skin is warm and dry. No rash noted.   Cardiovascular: Normal heart rate noted  Respiratory: Normal respiratory effort, no problems with respiration noted  Abdomen: Soft, gravid, appropriate for gestational age.  Pain/Pressure: Present     Pelvic: Cervical exam deferred        Extremities: Normal range of motion.  Edema: Mild pitting, slight indentation  Mental Status: Normal mood and affect. Normal behavior. Normal judgment and thought content.   Assessment and Plan:  Pregnancy: G1P0 at 2142w5d  1. Polyhydramnios, antepartum, single or unspecified fetus Follow up scan on 08/04/2017  2. Supervision of normal first pregnancy, antepartum - Tdap vaccine greater than or equal to 7yo IM Preterm labor symptoms and general obstetric precautions including but not limited to vaginal bleeding, contractions, leaking of fluid and  fetal movement were reviewed in detail with the patient. Please refer to After Visit Summary for other counseling recommendations.  Return in about 2 weeks (around 08/08/2017) for Pelvic cultures, OB Visit (LOB).  Future Appointments  Date Time Provider Department Center  08/04/2017  1:00 PM [facility] US 3 [facility]    Jaynie CollinsUgonna Aleayah Chico, MD

## 2017-07-25 NOTE — Patient Instructions (Signed)

## 2017-08-04 ENCOUNTER — Other Ambulatory Visit (HOSPITAL_COMMUNITY): Payer: Self-pay | Admitting: Obstetrics and Gynecology

## 2017-08-04 ENCOUNTER — Ambulatory Visit (HOSPITAL_COMMUNITY)
Admission: RE | Admit: 2017-08-04 | Discharge: 2017-08-04 | Disposition: A | Discharge status: Home / Self Care | Payer: Medicaid Other | Source: Ambulatory Visit | Attending: Advanced Practice Midwife | Admitting: Advanced Practice Midwife

## 2017-08-04 ENCOUNTER — Encounter (HOSPITAL_COMMUNITY): Payer: Self-pay

## 2017-08-04 DIAGNOSIS — O35HXX0 Maternal care for other (suspected) fetal abnormality and damage, fetal lower extremities anomalies, not applicable or unspecified: Secondary | ICD-10-CM

## 2017-08-04 DIAGNOSIS — Z3A35 35 weeks gestation of pregnancy: Secondary | ICD-10-CM | POA: Diagnosis not present

## 2017-08-04 DIAGNOSIS — O359XX0 Maternal care for (suspected) fetal abnormality and damage, unspecified, not applicable or unspecified: Secondary | ICD-10-CM | POA: Diagnosis not present

## 2017-08-04 DIAGNOSIS — O358XX0 Maternal care for other (suspected) fetal abnormality and damage, not applicable or unspecified: Secondary | ICD-10-CM | POA: Insufficient documentation

## 2017-08-04 DIAGNOSIS — Z362 Encounter for other antenatal screening follow-up: Secondary | ICD-10-CM

## 2017-08-04 DIAGNOSIS — O35EXX0 Maternal care for other (suspected) fetal abnormality and damage, fetal genitourinary anomalies, not applicable or unspecified: Secondary | ICD-10-CM

## 2017-08-04 DIAGNOSIS — Z3400 Encounter for supervision of normal first pregnancy, unspecified trimester: Secondary | ICD-10-CM

## 2017-08-04 NOTE — ED Notes (Signed)
BP re-checked per patient request.

## 2017-08-09 ENCOUNTER — Ambulatory Visit (INDEPENDENT_AMBULATORY_CARE_PROVIDER_SITE_OTHER): Payer: Medicaid Other | Admitting: Obstetrics and Gynecology

## 2017-08-09 ENCOUNTER — Other Ambulatory Visit (HOSPITAL_COMMUNITY)
Admission: RE | Admit: 2017-08-09 | Discharge: 2017-08-09 | Disposition: A | Discharge status: Home / Self Care | Payer: Medicaid Other | Source: Ambulatory Visit | Attending: Obstetrics and Gynecology | Admitting: Obstetrics and Gynecology

## 2017-08-09 VITALS — BP 129/81 | HR 117 | Wt 195.6 lb

## 2017-08-09 DIAGNOSIS — Z3403 Encounter for supervision of normal first pregnancy, third trimester: Secondary | ICD-10-CM

## 2017-08-09 DIAGNOSIS — O35EXX0 Maternal care for other (suspected) fetal abnormality and damage, fetal genitourinary anomalies, not applicable or unspecified: Secondary | ICD-10-CM

## 2017-08-09 DIAGNOSIS — O409XX0 Polyhydramnios, unspecified trimester, not applicable or unspecified: Secondary | ICD-10-CM

## 2017-08-09 DIAGNOSIS — Z3400 Encounter for supervision of normal first pregnancy, unspecified trimester: Secondary | ICD-10-CM | POA: Diagnosis not present

## 2017-08-09 DIAGNOSIS — O358XX0 Maternal care for other (suspected) fetal abnormality and damage, not applicable or unspecified: Secondary | ICD-10-CM

## 2017-08-09 DIAGNOSIS — O403XX0 Polyhydramnios, third trimester, not applicable or unspecified: Secondary | ICD-10-CM

## 2017-08-09 NOTE — Progress Notes (Signed)
Pt states pain in pelvic and back area.

## 2017-08-09 NOTE — Patient Instructions (Signed)

## 2017-08-09 NOTE — Progress Notes (Signed)
Subjective:  Sherry Leach is a 22 y.o. G1P0 at 4969w6d being seen today for ongoing prenatal care.  She is currently monitored for the following issues for this low-risk pregnancy and has Supervision of normal first pregnancy, antepartum; Pyelectasis of fetus on prenatal ultrasound; Polyhydramnios, antepartum complication; and Chlamydia infection affecting pregnancy on their problem list.  Patient reports no complaints.  Contractions: Irritability. Vag. Bleeding: None.  Movement: Present. Denies leaking of fluid.   The following portions of the patient's history were reviewed and updated as appropriate: allergies, current medications, past family history, past medical history, past social history, past surgical history and problem list. Problem list updated.  Objective:   Vitals:   08/09/17 1316  BP: 129/81  Pulse: (!) 117  Weight: 88.7 kg (195 lb 9.6 oz)    Fetal Status: Fetal Heart Rate (bpm): 160 Fundal Height: 34 cm Movement: Present  Presentation: Vertex  General:  Alert, oriented and cooperative. Patient is in no acute distress.  Skin: Skin is warm and dry. No rash noted.   Cardiovascular: Normal heart rate noted  Respiratory: Normal respiratory effort, no problems with respiration noted  Abdomen: Soft, gravid, appropriate for gestational age. Pain/Pressure: Present     Pelvic: Vag. Bleeding: None Vag D/C Character: White   Cervical exam performed Dilation: Fingertip Effacement (%): 40 Station: Ballotable  Extremities: Normal range of motion.  Edema: Trace  Mental Status: Normal mood and affect. Normal behavior. Normal judgment and thought content.   Urinalysis:      Assessment and Plan:  Pregnancy: G1P0 at 1869w6d  1. Supervision of normal first pregnancy, antepartum Doing well. Cultures collected.  - [facility]lam[facility] - Strep Gp B Culture+Rflx  2. Pyelectasis of fetus on prenatal ultrasound Resolved  3. Polyhydramnios, antepartum, single or  unspecified fetus Resolved  Preterm labor symptoms and general obstetric precautions including but not limited to vaginal bleeding, contractions, leaking of fluid and fetal movement were reviewed in detail with the patient. Please refer to After Visit Summary for other counseling recommendations.  Return in about 1 week (around 08/16/2017) for ob visit.   Pincus LargePhelps, Jazma Y, DO

## 2017-08-10 LAB — GC/CHLAMYDIA PROBE AMP (~~LOC~~) NOT AT ARMC
Chlamydia: NEGATIVE
NEISSERIA GONORRHEA: NEGATIVE

## 2017-08-13 LAB — STREP GP B CULTURE+RFLX: STREP GP B CULTURE+RFLX: NEGATIVE

## 2017-08-18 ENCOUNTER — Ambulatory Visit (INDEPENDENT_AMBULATORY_CARE_PROVIDER_SITE_OTHER): Payer: Medicaid Other | Admitting: Advanced Practice Midwife

## 2017-08-18 VITALS — BP 135/87 | HR 108 | Wt 198.3 lb

## 2017-08-18 DIAGNOSIS — Z3403 Encounter for supervision of normal first pregnancy, third trimester: Secondary | ICD-10-CM

## 2017-08-18 NOTE — Progress Notes (Signed)
   PRENATAL VISIT NOTE  Subjective:  Sherry Leach is a 22 y.o. G1P0 at 9385w1d being seen today for ongoing prenatal care.  She is currently monitored for the following issues for this low-risk pregnancy and has Supervision of normal first pregnancy, antepartum; Pyelectasis of fetus on prenatal ultrasound; Polyhydramnios, antepartum complication; and Chlamydia infection affecting pregnancy on their problem list.  Patient reports intermittent low abdominal contractions yesterday and last night, now resolved.  Contractions: Irregular. Vag. Bleeding: None.  Movement: Present. Denies leaking of fluid.   The following portions of the patient's history were reviewed and updated as appropriate: allergies, current medications, past family history, past medical history, past social history, past surgical history and problem list. Problem list updated.  Objective:   Vitals:   08/18/17 1126  BP: 135/87  Pulse: (!) 108  Weight: 198 lb 4.8 oz (89.9 kg)    Fetal Status: Fetal Heart Rate (bpm): 160 Fundal Height: 36 cm Movement: Present     General:  Alert, oriented and cooperative. Patient is in no acute distress.  Skin: Skin is warm and dry. No rash noted.   Cardiovascular: Normal heart rate noted  Respiratory: Normal respiratory effort, no problems with respiration noted  Abdomen: Soft, gravid, appropriate for gestational age.  Pain/Pressure: Present     Pelvic: Cervical exam performed      FT/thick/-3  Extremities: Normal range of motion.  Edema: Trace  Mental Status: Normal mood and affect. Normal behavior. Normal judgment and thought content.   Assessment and Plan:  Pregnancy: G1P0 at 5985w1d  1. Encounter for supervision of normal first pregnancy in third trimester --Discussed Braxton Hicks contractions vs authentic labor --Encourage PO hydration with water  Term labor symptoms and general obstetric precautions including but not limited to vaginal bleeding, contractions, leaking of fluid and  fetal movement were reviewed in detail with the patient. Please refer to After Visit Summary for other counseling recommendations.  Return in about 1 week (around 08/25/2017).  No future appointments.  Calvert CantorSamantha C Caedyn Tassinari, CNM 08/18/17  11:41 AM

## 2017-08-18 NOTE — Patient Instructions (Signed)
Vaginal Delivery Vaginal delivery means that you will give birth by pushing your baby out of your birth canal (vagina). A team of health care providers will help you before, during, and after vaginal delivery. Birth experiences are unique for every woman and every pregnancy, and birth experiences vary depending on where you choose to give birth. What should I do to prepare for my baby's birth? Before your baby is born, it is important to talk with your health care provider about:  Your labor and delivery preferences. These may include: ? Medicines that you may be given. ? How you will manage your pain. This might include non-medical pain relief techniques or injectable pain relief such as epidural analgesia. ? How you and your baby will be monitored during labor and delivery. ? Who may be in the labor and delivery room with you. ? Your feelings about surgical delivery of your baby (cesarean delivery, or C-section) if this becomes necessary. ? Your feelings about receiving donated blood through an IV tube (blood transfusion) if this becomes necessary.  Whether you are able: ? To take pictures or videos of the birth. ? To eat during labor and delivery. ? To move around, walk, or change positions during labor and delivery.  What to expect after your baby is born, such as: ? Whether delayed umbilical cord clamping and cutting is offered. ? Who will care for your baby right after birth. ? Medicines or tests that may be recommended for your baby. ? Whether breastfeeding is supported in your hospital or birth center. ? How long you will be in the hospital or birth center.  How any medical conditions you have may affect your baby or your labor and delivery experience.  To prepare for your baby's birth, you should also:  Attend all of your health care visits before delivery (prenatal visits) as recommended by your health care provider. This is important.  Prepare your home for your baby's  arrival. Make sure that you have: ? Diapers. ? Baby clothing. ? Feeding equipment. ? Safe sleeping arrangements for you and your baby.  Install a car seat in your vehicle. Have your car seat checked by a certified car seat installer to make sure that it is installed safely.  Think about who will help you with your new baby at home for at least the first several weeks after delivery.  What can I expect when I arrive at the birth center or hospital? Once you are in labor and have been admitted into the hospital or birth center, your health care provider may:  Review your pregnancy history and any concerns you have.  Insert an IV tube into one of your veins. This is used to give you fluids and medicines.  Check your blood pressure, pulse, temperature, and heart rate (vital signs).  Check whether your bag of water (amniotic sac) has broken (ruptured).  Talk with you about your birth plan and discuss pain control options.  Monitoring Your health care provider may monitor your contractions (uterine monitoring) and your baby's heart rate (fetal monitoring). You may need to be monitored:  Often, but not continuously (intermittently).  All the time or for long periods at a time (continuously). Continuous monitoring may be needed if: ? You are taking certain medicines, such as medicine to relieve pain or make your contractions stronger. ? You have pregnancy or labor complications.  Monitoring may be done by:  Placing a special stethoscope or a handheld monitoring device on your abdomen to   check your baby's heartbeat, and feeling your abdomen for contractions. This method of monitoring does not continuously record your baby's heartbeat or your contractions.  Placing monitors on your abdomen (external monitors) to record your baby's heartbeat and the frequency and length of contractions. You may not have to wear external monitors all the time.  Placing monitors inside of your uterus  (internal monitors) to record your baby's heartbeat and the frequency, length, and strength of your contractions. ? Your health care provider may use internal monitors if he or she needs more information about the strength of your contractions or your baby's heart rate. ? Internal monitors are put in place by passing a thin, flexible wire through your vagina and into your uterus. Depending on the type of monitor, it may remain in your uterus or on your baby's head until birth. ? Your health care provider will discuss the benefits and risks of internal monitoring with you and will ask for your permission before inserting the monitors.  Telemetry. This is a type of continuous monitoring that can be done with external or internal monitors. Instead of having to stay in bed, you are able to move around during telemetry. Ask your health care provider if telemetry is an option for you.  Physical exam Your health care provider may perform a physical exam. This may include:  Checking whether your baby is positioned: ? With the head toward your vagina (head-down). This is most common. ? With the head toward the top of your uterus (head-up or breech). If your baby is in a breech position, your health care provider may try to turn your baby to a head-down position so you can deliver vaginally. If it does not seem that your baby can be born vaginally, your provider may recommend surgery to deliver your baby. In rare cases, you may be able to deliver vaginally if your baby is head-up (breech delivery). ? Lying sideways (transverse). Babies that are lying sideways cannot be delivered vaginally.  Checking your cervix to determine: ? Whether it is thinning out (effacing). ? Whether it is opening up (dilating). ? How low your baby has moved into your birth canal.  What are the three stages of labor and delivery?  Normal labor and delivery is divided into the following three stages: Stage 1  Stage 1 is the  longest stage of labor, and it can last for hours or days. Stage 1 includes: ? Early labor. This is when contractions may be irregular, or regular and mild. Generally, early labor contractions are more than 10 minutes apart. ? Active labor. This is when contractions get longer, more regular, more frequent, and more intense. ? The transition phase. This is when contractions happen very close together, are very intense, and may last longer than during any other part of labor.  Contractions generally feel mild, infrequent, and irregular at first. They get stronger, more frequent (about every 2-3 minutes), and more regular as you progress from early labor through active labor and transition.  Many women progress through stage 1 naturally, but you may need help to continue making progress. If this happens, your health care provider may talk with you about: ? Rupturing your amniotic sac if it has not ruptured yet. ? Giving you medicine to help make your contractions stronger and more frequent.  Stage 1 ends when your cervix is completely dilated to 4 inches (10 cm) and completely effaced. This happens at the end of the transition phase. Stage 2  Once   your cervix is completely effaced and dilated to 4 inches (10 cm), you may start to feel an urge to push. It is common for the body to naturally take a rest before feeling the urge to push, especially if you received an epidural or certain other pain medicines. This rest period may last for up to 1-2 hours, depending on your unique labor experience.  During stage 2, contractions are generally less painful, because pushing helps relieve contraction pain. Instead of contraction pain, you may feel stretching and burning pain, especially when the widest part of your baby's head passes through the vaginal opening (crowning).  Your health care provider will closely monitor your pushing progress and your baby's progress through the vagina during stage 2.  Your  health care provider may massage the area of skin between your vaginal opening and anus (perineum) or apply warm compresses to your perineum. This helps it stretch as the baby's head starts to crown, which can help prevent perineal tearing. ? In some cases, an incision may be made in your perineum (episiotomy) to allow the baby to pass through the vaginal opening. An episiotomy helps to make the opening of the vagina larger to allow more room for the baby to fit through.  It is very important to breathe and focus so your health care provider can control the delivery of your baby's head. Your health care provider may have you decrease the intensity of your pushing, to help prevent perineal tearing.  After delivery of your baby's head, the shoulders and the rest of the body generally deliver very quickly and without difficulty.  Once your baby is delivered, the umbilical cord may be cut right away, or this may be delayed for 1-2 minutes, depending on your baby's health. This may vary among health care providers, hospitals, and birth centers.  If you and your baby are healthy enough, your baby may be placed on your chest or abdomen to help maintain the baby's temperature and to help you bond with each other. Some mothers and babies start breastfeeding at this time. Your health care team will dry your baby and help keep your baby warm during this time.  Your baby may need immediate care if he or she: ? Showed signs of distress during labor. ? Has a medical condition. ? Was born too early (prematurely). ? Had a bowel movement before birth (meconium). ? Shows signs of difficulty transitioning from being inside the uterus to being outside of the uterus. If you are planning to breastfeed, your health care team will help you begin a feeding. Stage 3  The third stage of labor starts immediately after the birth of your baby and ends after you deliver the placenta. The placenta is an organ that develops  during pregnancy to provide oxygen and nutrients to your baby in the womb.  Delivering the placenta may require some pushing, and you may have mild contractions. Breastfeeding can stimulate contractions to help you deliver the placenta.  After the placenta is delivered, your uterus should tighten (contract) and become firm. This helps to stop bleeding in your uterus. To help your uterus contract and to control bleeding, your health care provider may: ? Give you medicine by injection, through an IV tube, by mouth, or through your rectum (rectally). ? Massage your abdomen or perform a vaginal exam to remove any blood clots that are left in your uterus. ? Empty your bladder by placing a thin, flexible tube (catheter) into your bladder. ? Encourage   you to breastfeed your baby. After labor is over, you and your baby will be monitored closely to ensure that you are both healthy until you are ready to go home. Your health care team will teach you how to care for yourself and your baby. This information is not intended to replace advice given to you by your health care provider. Make sure you discuss any questions you have with your health care provider. Document Released: 10/27/2007 Document Revised: 08/07/2015 Document Reviewed: 02/01/2015 Elsevier Interactive Patient Education  2018 [employer]  

## 2017-08-24 ENCOUNTER — Ambulatory Visit (INDEPENDENT_AMBULATORY_CARE_PROVIDER_SITE_OTHER): Payer: Medicaid Other | Admitting: Student

## 2017-08-24 DIAGNOSIS — Z3400 Encounter for supervision of normal first pregnancy, unspecified trimester: Secondary | ICD-10-CM

## 2017-08-24 NOTE — Patient Instructions (Addendum)
AREA PEDIATRIC/FAMILY PRACTICE PHYSICIANS  [facility] [address], [address] [place], [place]  [address] Phone - [phone]   Fax - [phone]  [facility] [address] [place], [place] [address] Phone - [phone]   Fax - [phone]  JACK AMOS [address] [place], [place]  [address] Phone - [phone]   Fax - [phone]  [facility] [address], [address] [place], [place]  [address] Phone - [phone]   Fax - [phone]  [facility] [address] [place], [place]  [address] Phone - [phone]   Fax - [phone]  [facility] [address], [address] [place], [place]  [address] Phone - [phone]   Fax - [phone]  [facility] [address], [address] [place], [place]  [address] Phone - [phone]   Fax - [phone]  [facility] [address] [place], [place]  [address] Phone - [phone]   Fax - [phone]  [facility] [address] [place], [place]  [address] Phone - [phone]   Fax - [phone] [facility] [address] [place], [place]  [address] Phone - [phone]   Fax - [phone]  [facility] [address] [place], [place]  [address] Phone - [phone]   Fax - [phone]  [facility] [address], [address] [place], [place]  [address] Phone - [phone]   Fax - [phone]  [facility] [address], [address] [place], [place]  [address] Phone - [phone]   Fax - [phone]  [facility] [address], [address] [place], [place]  [address] Phone - [phone]  [facility] [address] [place], [place]  [address] Phone - [phone]   Fax - [phone]  [facility] [address], [address] [place], [place]  [address] Phone - [phone]   Fax - [phone]  [facility] [address] [place], [place]  [address] Phone - [phone]   Fax - [phone]  [facility] [address] [place], [place]  [address] Phone - [phone]   Fax - [phone]   [facility] [address], [address] [place], [place]  [address] Phone - [phone]   Fax - [phone]  [facility] [address], [address] [place], [place]  [address] Phone - [phone]   Fax - [phone]  DAVID RUBIN [address], [address] [place], [place]  [address] Phone - [phone]   Fax - [phone]  [facility] [address], [address] [place], [place]  [address] Phone - [phone]   Fax - [phone]  [facility] [address] [place], [place]  [address] Phone - [phone]   Fax - [phone] [facility] [address] [place], [place]  [address] Phone - [phone]   Fax - [phone]  [facility] [address] [place], [place]  [address] Phone - [phone]   Fax - [phone]  [facility] [place] [address], [address] [place], [place]  [address] Phone - [phone]   Fax - [phone]  [facility] Wyvonne Lenzharlene Flemming MD [address] [place] [address] Phone [phone]  Fax [phone]   Levonorgestrel intrauterine device (IUD) What is this medicine? LEVONORGESTREL IUD (LEE voe nor jes  trel) is a contraceptive (birth control) device. The device is placed inside the uterus by a healthcare professional. It is used to prevent pregnancy. This device can also be used to treat heavy bleeding that occurs during your period. This medicine may be used for other purposes; ask your health care provider or pharmacist if you have questions. COMMON BRAND NAME(S): Cameron Ali What should I tell my health care provider before I take this medicine? They need to know if you have any of these conditions: -abnormal Pap smear -cancer of the breast, uterus, or cervix -diabetes -endometritis -genital or pelvic infection now or in the past -have more than one sexual partner or your partner has more than one partner -heart disease -history of an ectopic or tubal pregnancy -immune system problems -IUD in place -liver disease or tumor -problems with blood clots or take blood-thinners -seizures -use intravenous drugs -uterus of unusual shape -vaginal bleeding that has not been explained -an unusual or allergic reaction to levonorgestrel, other hormones, silicone, or polyethylene, medicines, foods, dyes, or preservatives -pregnant or trying to get pregnant -breast-feeding How should I use this medicine? This device is placed inside the uterus by a health care professional. Talk to your pediatrician regarding the use of this medicine in children. Special care may be needed. Overdosage: If you think you have taken too much of this medicine contact a poison control center or emergency room at once. NOTE: This medicine is only for you. Do not share this medicine with others. What if I miss a dose? This does not apply. Depending on the brand of device you have inserted, the device will need to be replaced every 3 to 5 years if you wish to continue using this type of birth control. What may interact with this medicine? Do not take this medicine with any of the following medications: -amprenavir -bosentan -fosamprenavir This medicine may also interact with the following medications: -aprepitant -armodafinil -barbiturate medicines for inducing sleep or treating seizures -bexarotene -boceprevir -griseofulvin -medicines to treat seizures like carbamazepine, ethotoin, felbamate, oxcarbazepine, phenytoin,  topiramate -modafinil -pioglitazone -rifabutin -rifampin -rifapentine -some medicines to treat HIV infection like atazanavir, efavirenz, indinavir, lopinavir, nelfinavir, tipranavir, ritonavir -St. John's wort -warfarin This list may not describe all possible interactions. Give your health care provider a list of all the medicines, herbs, non-prescription drugs, or dietary supplements you use. Also tell them if you smoke, drink alcohol, or use illegal drugs. Some items may interact with your medicine. What should I watch for while using this medicine? Visit your doctor or health care professional for regular check ups. See your doctor if you or your partner has sexual contact with others, becomes HIV positive, or gets a sexual transmitted disease. This product does not protect you against HIV infection (AIDS) or other sexually transmitted diseases. You can check the placement of the IUD yourself by reaching up to the top of your vagina with clean fingers to feel the threads. Do not pull on the threads. It is a good habit to check placement after each menstrual period. Call your doctor right away if you feel more of the IUD than just the threads or if you cannot feel the threads at all. The IUD may come out by itself. You may become pregnant if the device comes out. If you notice that the IUD has come out use a backup birth control method like condoms and call your health care provider. Using tampons will not change the position of the IUD  and are okay to use during your period. This IUD can be safely scanned with magnetic resonance imaging (MRI) only under specific conditions. Before you have an MRI, tell your healthcare provider that you have an IUD in place, and which type of IUD you have in place. What side effects may I notice from receiving this medicine? Side effects that you should report to your doctor or health care professional as soon as possible: -allergic reactions like skin rash,  itching or hives, swelling of the face, lips, or tongue -fever, flu-like symptoms -genital sores -high blood pressure -no menstrual period for 6 weeks during use -pain, swelling, warmth in the leg -pelvic pain or tenderness -severe or sudden headache -signs of pregnancy -stomach cramping -sudden shortness of breath -trouble with balance, talking, or walking -unusual vaginal bleeding, discharge -yellowing of the eyes or skin Side effects that usually do not require medical attention (report to your doctor or health care professional if they continue or are bothersome): -acne -breast pain -change in sex drive or performance -changes in weight -cramping, dizziness, or faintness while the device is being inserted -headache -irregular menstrual bleeding within first 3 to 6 months of use -nausea This list may not describe all possible side effects. Call your doctor for medical advice about side effects. You may report side effects to FDA at 1-800-FDA-1088. Where should I keep my medicine? This does not apply. NOTE: This sheet is a summary. It may not cover all possible information. If you have questions about this medicine, talk to your doctor, pharmacist, or health care provider.  2018 Elsevier/Gold Standard (2015-10-30 14:14:56)

## 2017-08-24 NOTE — Progress Notes (Signed)
   PRENATAL VISIT NOTE  Subjective:  Sherry Leach is a 22 y.o. G1P0 at 4337w0d being seen today for ongoing prenatal care.  She is currently monitored for the following issues for this low-risk pregnancy and has Supervision of normal first pregnancy, antepartum; Pyelectasis of fetus on prenatal ultrasound; Polyhydramnios, antepartum complication; and Chlamydia infection affecting pregnancy on their problem list.  Patient reports uterine irratability. Would like to be checked. .  Contractions: Irregular. Vag. Bleeding: None.  Movement: Present. Denies leaking of fluid.   The following portions of the patient's history were reviewed and updated as appropriate: allergies, current medications, past family history, past medical history, past social history, past surgical history and problem list. Problem list updated.  Objective:   Vitals:   08/24/17 1536  BP: 121/77  Pulse: 92  Weight: 201 lb 6.4 oz (91.4 kg)    Fetal Status: Fetal Heart Rate (bpm): 151 Fundal Height: 37 cm Movement: Present     General:  Alert, oriented and cooperative. Patient is in no acute distress.  Skin: Skin is warm and dry. No rash noted.   Cardiovascular: Normal heart rate noted  Respiratory: Normal respiratory effort, no problems with respiration noted  Abdomen: Soft, gravid, appropriate for gestational age.  Pain/Pressure: Present     Pelvic: Cervical exam deferred        Extremities: Normal range of motion.  Edema: Trace  Mental Status: Normal mood and affect. Normal behavior. Normal judgment and thought content.   Assessment and Plan:  Pregnancy: G1P0 at 6237w0d  1. Supervision of normal first pregnancy, antepartum -List of peds given -Considering Mirena; info given.  Term labor symptoms and general obstetric precautions including but not limited to vaginal bleeding, contractions, leaking of fluid and fetal movement were reviewed in detail with the patient. Please refer to After Visit Summary for other  counseling recommendations.  Return in about 1 week (around 08/31/2017).  No future appointments.  Marylene LandKathryn Lorraine Kooistra, CNM

## 2017-08-28 ENCOUNTER — Encounter (HOSPITAL_COMMUNITY): Payer: Self-pay

## 2017-08-28 ENCOUNTER — Inpatient Hospital Stay (HOSPITAL_COMMUNITY)
Admission: AD | Admit: 2017-08-28 | Discharge: 2017-08-28 | Disposition: A | Discharge status: Home / Self Care | Payer: Medicaid Other | Source: Ambulatory Visit | Attending: Family Medicine | Admitting: Family Medicine

## 2017-08-28 ENCOUNTER — Other Ambulatory Visit: Payer: Self-pay

## 2017-08-28 DIAGNOSIS — Z3A39 39 weeks gestation of pregnancy: Secondary | ICD-10-CM | POA: Diagnosis not present

## 2017-08-28 DIAGNOSIS — Z3483 Encounter for supervision of other normal pregnancy, third trimester: Secondary | ICD-10-CM | POA: Insufficient documentation

## 2017-08-28 DIAGNOSIS — O479 False labor, unspecified: Secondary | ICD-10-CM

## 2017-08-28 NOTE — MAU Note (Signed)
Urine in lab 

## 2017-08-28 NOTE — MAU Note (Signed)
I have communicated with Dr. Hyman BibleAlvis  and reviewed vital signs:  Vitals:   08/28/17 0837 08/28/17 0928  BP: 131/84 128/79  Pulse: 96   Resp: 18 18  Temp: 98.5 F (36.9 C)   SpO2: 100% 100%    Vaginal exam:  Dilation: 1 Effacement (%): Thick Cervical Position: Posterior Station: -2 Presentation: Vertex Exam by:: Ninfa MeekerJudy Lowe RN ,   Also reviewed contraction pattern and that non-stress test is reactive.  It has been documented that patient not contracting regularly  with minimal cervical change over 1 hour not indicating active labor.  Patient denies any other complaints.  Based on this report provider has given order for discharge.  A discharge order and diagnosis entered by a provider.   Labor discharge instructions reviewed with patient.

## 2017-08-28 NOTE — Discharge Instructions (Signed)
Braxton Hicks Contractions °Contractions of the uterus can occur throughout pregnancy, but they are not always a sign that you are in labor. You may have practice contractions called Braxton Hicks contractions. These false labor contractions are sometimes confused with true labor. °What are Braxton Hicks contractions? °Braxton Hicks contractions are tightening movements that occur in the muscles of the uterus before labor. Unlike true labor contractions, these contractions do not result in opening (dilation) and thinning of the cervix. Toward the end of pregnancy (32-34 weeks), Braxton Hicks contractions can happen more often and may become stronger. These contractions are sometimes difficult to tell apart from true labor because they can be very uncomfortable. You should not feel embarrassed if you go to the hospital with false labor. °Sometimes, the only way to tell if you are in true labor is for your health care provider to look for changes in the cervix. The health care provider will do a physical exam and may monitor your contractions. If you are not in true labor, the exam should show that your cervix is not dilating and your water has not broken. °If there are other health problems associated with your pregnancy, it is completely safe for you to be sent home with false labor. You may continue to have Braxton Hicks contractions until you go into true labor. °How to tell the difference between true labor and false labor °True labor °· Contractions last 30-70 seconds. °· Contractions become very regular. °· Discomfort is usually felt in the top of the uterus, and it spreads to the lower abdomen and low back. °· Contractions do not go away with walking. °· Contractions usually become more intense and increase in frequency. °· The cervix dilates and gets thinner. °False labor °· Contractions are usually shorter and not as strong as true labor contractions. °· Contractions are usually irregular. °· Contractions  are often felt in the front of the lower abdomen and in the groin. °· Contractions may go away when you walk around or change positions while lying down. °· Contractions get weaker and are shorter-lasting as time goes on. °· The cervix usually does not dilate or become thin. °Follow these instructions at home: °· Take over-the-counter and prescription medicines only as told by your health care provider. °· Keep up with your usual exercises and follow other instructions from your health care provider. °· Eat and drink lightly if you think you are going into labor. °· If Braxton Hicks contractions are making you uncomfortable: °? Change your position from lying down or resting to walking, or change from walking to resting. °? Sit and rest in a tub of warm water. °? Drink enough fluid to keep your urine pale yellow. Dehydration may cause these contractions. °? Do slow and deep breathing several times an hour. °· Keep all follow-up prenatal visits as told by your health care provider. This is important. °Contact a health care provider if: °· You have a fever. °· You have continuous pain in your abdomen. °Get help right away if: °· Your contractions become stronger, more regular, and closer together. °· You have fluid leaking or gushing from your vagina. °· You pass blood-tinged mucus (bloody show). °· You have bleeding from your vagina. °· You have low back pain that you never had before. °· You feel your baby’s head pushing down and causing pelvic pressure. °· Your baby is not moving inside you as much as it used to. °Summary °· Contractions that occur before labor are called Braxton   Hicks contractions, false labor, or practice contractions. °· Braxton Hicks contractions are usually shorter, weaker, farther apart, and less regular than true labor contractions. True labor contractions usually become progressively stronger and regular and they become more frequent. °· Manage discomfort from Braxton Hicks contractions by  changing position, resting in a warm bath, drinking plenty of water, or practicing deep breathing. °This information is not intended to replace advice given to you by your health care provider. Make sure you discuss any questions you have with your health care provider. °Document Released: 06/02/2016 Document Revised: 06/02/2016 Document Reviewed: 06/02/2016 °Elsevier Interactive Patient Education © 2018 Elsevier Inc. ° °

## 2017-08-28 NOTE — MAU Note (Signed)
Pt. States ctx like abd pain started last night around 8pm.  Pt. States her feet and hands were swollen last night, but are no longer swollen.  Pt. Denies vag. Bleeding, or leaking fluid.

## 2017-08-31 ENCOUNTER — Ambulatory Visit (INDEPENDENT_AMBULATORY_CARE_PROVIDER_SITE_OTHER): Payer: Medicaid Other | Admitting: Obstetrics and Gynecology

## 2017-08-31 ENCOUNTER — Encounter: Payer: Self-pay | Admitting: Obstetrics and Gynecology

## 2017-08-31 VITALS — BP 134/74 | HR 86 | Wt 202.2 lb

## 2017-08-31 DIAGNOSIS — Z3400 Encounter for supervision of normal first pregnancy, unspecified trimester: Secondary | ICD-10-CM

## 2017-08-31 DIAGNOSIS — Z3403 Encounter for supervision of normal first pregnancy, third trimester: Secondary | ICD-10-CM

## 2017-08-31 NOTE — Progress Notes (Signed)
Subjective:  Sherry Leach is a 22 y.o. G1P0 at 3341w0d being seen today for ongoing prenatal care.  She is currently monitored for the following issues for this low-risk pregnancy and has Supervision of normal first pregnancy, antepartum on their problem list.  Patient reports general discomforts of pregnancy.  Contractions: Irregular. Vag. Bleeding: None.  Movement: Present. Denies leaking of fluid.   The following portions of the patient's history were reviewed and updated as appropriate: allergies, current medications, past family history, past medical history, past social history, past surgical history and problem list. Problem list updated.  Objective:   Vitals:   08/31/17 1518  BP: 134/74  Pulse: 86  Weight: 202 lb 3.2 oz (91.7 kg)    Fetal Status: Fetal Heart Rate (bpm): 145   Movement: Present     General:  Alert, oriented and cooperative. Patient is in no acute distress.  Skin: Skin is warm and dry. No rash noted.   Cardiovascular: Normal heart rate noted  Respiratory: Normal respiratory effort, no problems with respiration noted  Abdomen: Soft, gravid, appropriate for gestational age. Pain/Pressure: Present     Pelvic:  Cervical exam performed        Extremities: Normal range of motion.  Edema: Trace  Mental Status: Normal mood and affect. Normal behavior. Normal judgment and thought content.   Urinalysis:      Assessment and Plan:  Pregnancy: G1P0 at 8541w0d  1. Supervision of normal first pregnancy, antepartum Labor precautions BPP/NST next week d/t postdates IOL secheduled at 41 weeks  Term labor symptoms and general obstetric precautions including but not limited to vaginal bleeding, contractions, leaking of fluid and fetal movement were reviewed in detail with the patient. Please refer to After Visit Summary for other counseling recommendations.  Return in about 1 week (around 09/07/2017) for OB visit.   Hermina StaggersErvin, Miia Blanks L, MD

## 2017-08-31 NOTE — Patient Instructions (Signed)
Vaginal Delivery Vaginal delivery means that you will give birth by pushing your baby out of your birth canal (vagina). A team of health care providers will help you before, during, and after vaginal delivery. Birth experiences are unique for every woman and every pregnancy, and birth experiences vary depending on where you choose to give birth. What should I do to prepare for my baby's birth? Before your baby is born, it is important to talk with your health care provider about:  Your labor and delivery preferences. These may include: ? Medicines that you may be given. ? How you will manage your pain. This might include non-medical pain relief techniques or injectable pain relief such as epidural analgesia. ? How you and your baby will be monitored during labor and delivery. ? Who may be in the labor and delivery room with you. ? Your feelings about surgical delivery of your baby (cesarean delivery, or C-section) if this becomes necessary. ? Your feelings about receiving donated blood through an IV tube (blood transfusion) if this becomes necessary.  Whether you are able: ? To take pictures or videos of the birth. ? To eat during labor and delivery. ? To move around, walk, or change positions during labor and delivery.  What to expect after your baby is born, such as: ? Whether delayed umbilical cord clamping and cutting is offered. ? Who will care for your baby right after birth. ? Medicines or tests that may be recommended for your baby. ? Whether breastfeeding is supported in your hospital or birth center. ? How long you will be in the hospital or birth center.  How any medical conditions you have may affect your baby or your labor and delivery experience.  To prepare for your baby's birth, you should also:  Attend all of your health care visits before delivery (prenatal visits) as recommended by your health care provider. This is important.  Prepare your home for your baby's  arrival. Make sure that you have: ? Diapers. ? Baby clothing. ? Feeding equipment. ? Safe sleeping arrangements for you and your baby.  Install a car seat in your vehicle. Have your car seat checked by a certified car seat installer to make sure that it is installed safely.  Think about who will help you with your new baby at home for at least the first several weeks after delivery.  What can I expect when I arrive at the birth center or hospital? Once you are in labor and have been admitted into the hospital or birth center, your health care provider may:  Review your pregnancy history and any concerns you have.  Insert an IV tube into one of your veins. This is used to give you fluids and medicines.  Check your blood pressure, pulse, temperature, and heart rate (vital signs).  Check whether your bag of water (amniotic sac) has broken (ruptured).  Talk with you about your birth plan and discuss pain control options.  Monitoring Your health care provider may monitor your contractions (uterine monitoring) and your baby's heart rate (fetal monitoring). You may need to be monitored:  Often, but not continuously (intermittently).  All the time or for long periods at a time (continuously). Continuous monitoring may be needed if: ? You are taking certain medicines, such as medicine to relieve pain or make your contractions stronger. ? You have pregnancy or labor complications.  Monitoring may be done by:  Placing a special stethoscope or a handheld monitoring device on your abdomen to   check your baby's heartbeat, and feeling your abdomen for contractions. This method of monitoring does not continuously record your baby's heartbeat or your contractions.  Placing monitors on your abdomen (external monitors) to record your baby's heartbeat and the frequency and length of contractions. You may not have to wear external monitors all the time.  Placing monitors inside of your uterus  (internal monitors) to record your baby's heartbeat and the frequency, length, and strength of your contractions. ? Your health care provider may use internal monitors if he or she needs more information about the strength of your contractions or your baby's heart rate. ? Internal monitors are put in place by passing a thin, flexible wire through your vagina and into your uterus. Depending on the type of monitor, it may remain in your uterus or on your baby's head until birth. ? Your health care provider will discuss the benefits and risks of internal monitoring with you and will ask for your permission before inserting the monitors.  Telemetry. This is a type of continuous monitoring that can be done with external or internal monitors. Instead of having to stay in bed, you are able to move around during telemetry. Ask your health care provider if telemetry is an option for you.  Physical exam Your health care provider may perform a physical exam. This may include:  Checking whether your baby is positioned: ? With the head toward your vagina (head-down). This is most common. ? With the head toward the top of your uterus (head-up or breech). If your baby is in a breech position, your health care provider may try to turn your baby to a head-down position so you can deliver vaginally. If it does not seem that your baby can be born vaginally, your provider may recommend surgery to deliver your baby. In rare cases, you may be able to deliver vaginally if your baby is head-up (breech delivery). ? Lying sideways (transverse). Babies that are lying sideways cannot be delivered vaginally.  Checking your cervix to determine: ? Whether it is thinning out (effacing). ? Whether it is opening up (dilating). ? How low your baby has moved into your birth canal.  What are the three stages of labor and delivery?  Normal labor and delivery is divided into the following three stages: Stage 1  Stage 1 is the  longest stage of labor, and it can last for hours or days. Stage 1 includes: ? Early labor. This is when contractions may be irregular, or regular and mild. Generally, early labor contractions are more than 10 minutes apart. ? Active labor. This is when contractions get longer, more regular, more frequent, and more intense. ? The transition phase. This is when contractions happen very close together, are very intense, and may last longer than during any other part of labor.  Contractions generally feel mild, infrequent, and irregular at first. They get stronger, more frequent (about every 2-3 minutes), and more regular as you progress from early labor through active labor and transition.  Many women progress through stage 1 naturally, but you may need help to continue making progress. If this happens, your health care provider may talk with you about: ? Rupturing your amniotic sac if it has not ruptured yet. ? Giving you medicine to help make your contractions stronger and more frequent.  Stage 1 ends when your cervix is completely dilated to 4 inches (10 cm) and completely effaced. This happens at the end of the transition phase. Stage 2  Once   your cervix is completely effaced and dilated to 4 inches (10 cm), you may start to feel an urge to push. It is common for the body to naturally take a rest before feeling the urge to push, especially if you received an epidural or certain other pain medicines. This rest period may last for up to 1-2 hours, depending on your unique labor experience.  During stage 2, contractions are generally less painful, because pushing helps relieve contraction pain. Instead of contraction pain, you may feel stretching and burning pain, especially when the widest part of your baby's head passes through the vaginal opening (crowning).  Your health care provider will closely monitor your pushing progress and your baby's progress through the vagina during stage 2.  Your  health care provider may massage the area of skin between your vaginal opening and anus (perineum) or apply warm compresses to your perineum. This helps it stretch as the baby's head starts to crown, which can help prevent perineal tearing. ? In some cases, an incision may be made in your perineum (episiotomy) to allow the baby to pass through the vaginal opening. An episiotomy helps to make the opening of the vagina larger to allow more room for the baby to fit through.  It is very important to breathe and focus so your health care provider can control the delivery of your baby's head. Your health care provider may have you decrease the intensity of your pushing, to help prevent perineal tearing.  After delivery of your baby's head, the shoulders and the rest of the body generally deliver very quickly and without difficulty.  Once your baby is delivered, the umbilical cord may be cut right away, or this may be delayed for 1-2 minutes, depending on your baby's health. This may vary among health care providers, hospitals, and birth centers.  If you and your baby are healthy enough, your baby may be placed on your chest or abdomen to help maintain the baby's temperature and to help you bond with each other. Some mothers and babies start breastfeeding at this time. Your health care team will dry your baby and help keep your baby warm during this time.  Your baby may need immediate care if he or she: ? Showed signs of distress during labor. ? Has a medical condition. ? Was born too early (prematurely). ? Had a bowel movement before birth (meconium). ? Shows signs of difficulty transitioning from being inside the uterus to being outside of the uterus. If you are planning to breastfeed, your health care team will help you begin a feeding. Stage 3  The third stage of labor starts immediately after the birth of your baby and ends after you deliver the placenta. The placenta is an organ that develops  during pregnancy to provide oxygen and nutrients to your baby in the womb.  Delivering the placenta may require some pushing, and you may have mild contractions. Breastfeeding can stimulate contractions to help you deliver the placenta.  After the placenta is delivered, your uterus should tighten (contract) and become firm. This helps to stop bleeding in your uterus. To help your uterus contract and to control bleeding, your health care provider may: ? Give you medicine by injection, through an IV tube, by mouth, or through your rectum (rectally). ? Massage your abdomen or perform a vaginal exam to remove any blood clots that are left in your uterus. ? Empty your bladder by placing a thin, flexible tube (catheter) into your bladder. ? Encourage   you to breastfeed your baby. After labor is over, you and your baby will be monitored closely to ensure that you are both healthy until you are ready to go home. Your health care team will teach you how to care for yourself and your baby. This information is not intended to replace advice given to you by your health care provider. Make sure you discuss any questions you have with your health care provider. Document Released: 10/27/2007 Document Revised: 08/07/2015 Document Reviewed: 02/01/2015 Elsevier Interactive Patient Education  2018 [employer]  

## 2017-09-01 ENCOUNTER — Telehealth (HOSPITAL_COMMUNITY): Payer: Self-pay | Admitting: *Deleted

## 2017-09-01 ENCOUNTER — Encounter (HOSPITAL_COMMUNITY): Payer: Self-pay | Admitting: *Deleted

## 2017-09-01 NOTE — Telephone Encounter (Signed)
Preadmission screen  

## 2017-09-04 DIAGNOSIS — Z6791 Unspecified blood type, Rh negative: Secondary | ICD-10-CM | POA: Insufficient documentation

## 2017-09-04 DIAGNOSIS — O26899 Other specified pregnancy related conditions, unspecified trimester: Secondary | ICD-10-CM

## 2017-09-05 ENCOUNTER — Ambulatory Visit: Payer: Medicaid Other | Admitting: *Deleted

## 2017-09-05 ENCOUNTER — Ambulatory Visit (INDEPENDENT_AMBULATORY_CARE_PROVIDER_SITE_OTHER): Payer: Medicaid Other | Admitting: Student

## 2017-09-05 VITALS — BP 139/83 | HR 85 | Wt 204.0 lb

## 2017-09-05 DIAGNOSIS — Z3400 Encounter for supervision of normal first pregnancy, unspecified trimester: Secondary | ICD-10-CM

## 2017-09-05 DIAGNOSIS — O26899 Other specified pregnancy related conditions, unspecified trimester: Secondary | ICD-10-CM

## 2017-09-05 DIAGNOSIS — Z6791 Unspecified blood type, Rh negative: Secondary | ICD-10-CM

## 2017-09-05 DIAGNOSIS — Z3403 Encounter for supervision of normal first pregnancy, third trimester: Secondary | ICD-10-CM

## 2017-09-05 NOTE — Progress Notes (Signed)
   PRENATAL VISIT NOTE  Subjective:  Sherry Leach is a 22 y.o. G1P0 at 131w5d being seen today for ongoing prenatal care.  She is currently monitored for the following issues for this low-risk pregnancy and has Supervision of normal first pregnancy, antepartum and Rh negative state in antepartum period on their problem list.  Patient reports no complaints.  Contractions: Irregular. Vag. Bleeding: None.  Movement: Present. Denies leaking of fluid.   The following portions of the patient's history were reviewed and updated as appropriate: allergies, current medications, past family history, past medical history, past social history, past surgical history and problem list. Problem list updated.  Objective:   Vitals:   09/05/17 1542  BP: 139/83  Pulse: 85  Weight: 204 lb (92.5 kg)    Fetal Status: Fetal Heart Rate (bpm): 143 Fundal Height: 37 cm Movement: Present  Presentation: Vertex  General:  Alert, oriented and cooperative. Patient is in no acute distress.  Skin: Skin is warm and dry. No rash noted.   Cardiovascular: Normal heart rate noted  Respiratory: Normal respiratory effort, no problems with respiration noted  Abdomen: Soft, gravid, appropriate for gestational age.  Pain/Pressure: Present     Pelvic: Cervical exam performed Dilation: Closed Effacement (%): Thick Station: Ballotable  Extremities: Normal range of motion.  Edema: Trace  Mental Status: Normal mood and affect. Normal behavior. Normal judgment and thought content.   Assessment and Plan:  Pregnancy: G1P0 at 6631w5d  1. Rh negative state in antepartum period S/p Rhogam 2. Doing well; discussed labor induction. Return to clinic next week for NST/BPP next week. IOL scheduled for next week.   Term labor symptoms and general obstetric precautions including but not limited to vaginal bleeding, contractions, leaking of fluid and fetal movement were reviewed in detail with the patient. Please refer to After Visit Summary  for other counseling recommendations.  Return in about 1 week (around 09/12/2017) for NST/BPP only.  Future Appointments  Date Time Provider Department Center  09/05/2017  4:15 PM Madlyn FrankelKooistra, Timoth Schara Lorraine, CNM [facility]  09/11/2017 11:15 AM [facility] NST [facility] WOC  09/14/2017 12:00 AM [facility]-BSSCHED ROOM [facility]-BSSCHED None    Marylene LandKathryn Lorraine Kyiesha [place]ward, [place]

## 2017-09-05 NOTE — Patient Instructions (Signed)
Labor Induction Labor induction is when steps are taken to cause a pregnant woman to begin the labor process. Most women go into labor on their own between 37 weeks and 42 weeks of the pregnancy. When this does not happen or when there is a medical need, methods may be used to induce labor. Labor induction causes a pregnant woman's uterus to contract. It also causes the cervix to soften (ripen), open (dilate), and thin out (efface). Usually, labor is not induced before 39 weeks of the pregnancy unless there is a problem with the baby or mother. Before inducing labor, your health care provider will consider a number of factors, including the following:  The medical condition of you and the baby.  How many weeks along you are.  The status of the baby's lung maturity.  The condition of the cervix.  The position of the baby. What are the reasons for labor induction? Labor may be induced for the following reasons:  The health of the baby or mother is at risk.  The pregnancy is overdue by 1 week or more.  The water breaks but labor does not start on its own.  The mother has a health condition or serious illness, such as high blood pressure, infection, placental abruption, or diabetes.  The amniotic fluid amounts are low around the baby.  The baby is distressed. Convenience or wanting the baby to be born on a certain date is not a reason for inducing labor. What methods are used for labor induction? Several methods of labor induction may be used, such as:  Prostaglandin medicine. This medicine causes the cervix to dilate and ripen. The medicine will also start contractions. It can be taken by mouth or by inserting a suppository into the vagina.  Inserting a thin tube (catheter) with a balloon on the end into the vagina to dilate the cervix. Once inserted, the balloon is expanded with water, which causes the cervix to open.  Stripping the membranes. Your health care provider separates  amniotic sac tissue from the cervix, causing the cervix to be stretched and causing the release of a hormone called progesterone. This may cause the uterus to contract. It is often done during an office visit. You will be sent home to wait for the contractions to begin. You will then come in for an induction.  Breaking the water. Your health care provider makes a hole in the amniotic sac using a small instrument. Once the amniotic sac breaks, contractions should begin. This may still take hours to see an effect.  Medicine to trigger or strengthen contractions. This medicine is given through an IV access tube inserted into a vein in your arm. All of the methods of induction, besides stripping the membranes, will be done in the hospital. Induction is done in the hospital so that you and the baby can be carefully monitored. How long does it take for labor to be induced? Some inductions can take up to 2-3 days. Depending on the cervix, it usually takes less time. It takes longer when you are induced early in the pregnancy or if this is your first pregnancy. If a mother is still pregnant and the induction has been going on for 2-3 days, either the mother will be sent home or a cesarean delivery will be needed. What are the risks associated with labor induction? Some of the risks of induction include:  Changes in fetal heart rate, such as too high, too low, or erratic.  Fetal distress.    Chance of infection for the mother and baby.  Increased chance of having a cesarean delivery.  Breaking off (abruption) of the placenta from the uterus (rare).  Uterine rupture (very rare). When induction is needed for medical reasons, the benefits of induction may outweigh the risks. What are some reasons for not inducing labor? Labor induction should not be done if:  It is shown that your baby does not tolerate labor.  You have had previous surgeries on your uterus, such as a myomectomy or the removal of  fibroids.  Your placenta lies very low in the uterus and blocks the opening of the cervix (placenta previa).  Your baby is not in a head-down position.  The umbilical cord drops down into the birth canal in front of the baby. This could cut off the baby's blood and oxygen supply.  You have had a previous cesarean delivery.  There are unusual circumstances, such as the baby being extremely premature. This information is not intended to replace advice given to you by your health care provider. Make sure you discuss any questions you have with your health care provider. Document Released: 06/08/2006 Document Revised: 06/25/2015 Document Reviewed: 08/16/2012 Elsevier Interactive Patient Education  2017 [employer]  

## 2017-09-05 NOTE — Progress Notes (Signed)
Per consult with Luna KitchensKathryn Kooistra, CNM pt does not require fetal testing today as scheduled since she is not yet postdates. Pt will have prenatal visit as scheduled and will be scheduled for postdates testing on 8/12. IOL scheduled on 8/15 @ 41 wks.

## 2017-09-08 ENCOUNTER — Inpatient Hospital Stay (HOSPITAL_COMMUNITY)
Admission: RE | Admit: 2017-09-08 | Discharge: 2017-09-08 | Disposition: A | Discharge status: Home / Self Care | Payer: Medicaid Other | Source: Ambulatory Visit | Attending: Obstetrics and Gynecology | Admitting: Obstetrics and Gynecology

## 2017-09-08 ENCOUNTER — Encounter (HOSPITAL_COMMUNITY): Payer: Self-pay | Admitting: *Deleted

## 2017-09-08 DIAGNOSIS — Z87891 Personal history of nicotine dependence: Secondary | ICD-10-CM | POA: Insufficient documentation

## 2017-09-08 DIAGNOSIS — Z3A40 40 weeks gestation of pregnancy: Secondary | ICD-10-CM | POA: Diagnosis not present

## 2017-09-08 DIAGNOSIS — R531 Weakness: Secondary | ICD-10-CM | POA: Insufficient documentation

## 2017-09-08 DIAGNOSIS — O26893 Other specified pregnancy related conditions, third trimester: Secondary | ICD-10-CM | POA: Diagnosis not present

## 2017-09-08 DIAGNOSIS — R51 Headache: Secondary | ICD-10-CM | POA: Insufficient documentation

## 2017-09-08 DIAGNOSIS — O1203 Gestational edema, third trimester: Secondary | ICD-10-CM | POA: Diagnosis not present

## 2017-09-08 DIAGNOSIS — M7989 Other specified soft tissue disorders: Secondary | ICD-10-CM | POA: Insufficient documentation

## 2017-09-08 DIAGNOSIS — R42 Dizziness and giddiness: Secondary | ICD-10-CM | POA: Diagnosis not present

## 2017-09-08 DIAGNOSIS — Z79899 Other long term (current) drug therapy: Secondary | ICD-10-CM | POA: Diagnosis not present

## 2017-09-08 DIAGNOSIS — Z8249 Family history of ischemic heart disease and other diseases of the circulatory system: Secondary | ICD-10-CM | POA: Diagnosis not present

## 2017-09-08 DIAGNOSIS — Z880 Allergy status to penicillin: Secondary | ICD-10-CM | POA: Insufficient documentation

## 2017-09-08 DIAGNOSIS — R1111 Vomiting without nausea: Secondary | ICD-10-CM

## 2017-09-08 LAB — URINALYSIS, ROUTINE W REFLEX MICROSCOPIC
BILIRUBIN URINE: NEGATIVE
GLUCOSE, UA: NEGATIVE mg/dL
HGB URINE DIPSTICK: NEGATIVE
Ketones, ur: NEGATIVE mg/dL
NITRITE: NEGATIVE
Protein, ur: NEGATIVE mg/dL
Specific Gravity, Urine: 1.009 (ref 1.005–1.030)
pH: 7 (ref 5.0–8.0)

## 2017-09-08 MED ORDER — ONDANSETRON 4 MG PO TBDP
4.0000 mg | ORAL_TABLET | Freq: Once | ORAL | Status: AC
  Administered 2017-09-08: 4 mg via ORAL
  Filled 2017-09-08: qty 1

## 2017-09-08 NOTE — MAU Provider Note (Signed)
Chief Complaint:  Fatigue and Leg Swelling (hands and feet)   First Provider Initiated Contact with Patient 09/08/17 0303     HPI  HPI: Sherry Leach is a 22 y.o. G1P0 at 40w1dwho presents to maternity admissions reporting swelling of hand off and on.  Also feels weak and vomited once  Had a headache but it went away.  States she is "just over it".  Her friend thinks she has preeclampsia.  She reports good fetal movement, denies LOF, vaginal bleeding, vaginal itching/burning, urinary symptoms, h/a, dizziness, diarrhea, constipation or fever/chills.    RN Note: Pt reports a lot of swelling in fingers and hands for the past few days. Reports feeling weak and dizzy. States she vomited once on the way here. Reports a HA earlier. Took Tylenol around 8:30pm. Pt denies vaginal bleeding or LOF. Reports good fetal movement.   Past Medical History: Past Medical History:  Diagnosis Date  . Medical history non-contributory   . Seasonal allergies     Past obstetric history: OB History  Gravida Para Term Preterm AB Living  1         0  SAB TAB Ectopic Multiple Live Births               # Outcome Date GA Lbr Len/2nd Weight Sex Delivery Anes PTL Lv  1 Current             Past Surgical History: Past Surgical History:  Procedure Laterality Date  . MOUTH SURGERY      Family History: Family History  Problem Relation Age of Onset  . Hypertension Mother   . Hypertension Other     Social History: Social History   Tobacco Use  . Smoking status: Former Smoker    Packs/day: 0.50    Last attempt to quit: 01/01/2017    Years since quitting: 0.6  . Smokeless tobacco: Never Used  Substance Use Topics  . Alcohol use: No  . Drug use: No    Allergies:  Allergies  Allergen Reactions  . Amoxicillin Rash    Has patient had a PCN reaction causing immediate rash, facial/tongue/throat swelling, SOB or lightheadedness with hypotension: No Has patient had a PCN reaction causing severe rash  involving mucus membranes or skin necrosis: No Has patient had a PCN reaction that required hospitalization: No Has patient had a PCN reaction occurring within the last 10 years: Yes If all of the above answers are "NO", then may proceed with Cephalosporin use.   Marland Kitchen Penicillins Rash    Has patient had a PCN reaction causing immediate rash, facial/tongue/throat swelling, SOB or lightheadedness with hypotension: No Has patient had a PCN reaction causing severe rash involving mucus membranes or skin necrosis: No Has patient had a PCN reaction that required hospitalization: No Has patient had a PCN reaction occurring within the last 10 years: Yes If all of the above answers are "NO", then may proceed with Cephalosporin use.     Meds:  Medications Prior to Admission  Medication Sig Dispense Refill Last Dose  . acetaminophen (TYLENOL) 325 MG tablet Take 325 mg by mouth every 6 (six) hours as needed for mild pain.   09/07/2017 at Unknown time  . Calcium Carbonate Antacid (TUMS PO) Take by mouth.   Past Week at Unknown time  . cephALEXin (KEFLEX) 500 MG capsule Take 500 mg by mouth daily.   09/07/2017 at Unknown time  . Prenatal Vit-Fe Fumarate-FA (PRENATAL MULTIVITAMIN) TABS tablet Take 1 tablet by mouth daily  at 12 noon.   09/07/2017 at Unknown time  . cyclobenzaprine (FLEXERIL) 10 MG tablet Take 1 tablet (10 mg total) by mouth 3 (three) times daily as needed for muscle spasms. (Patient not taking: Reported on 08/31/2017) 30 tablet 0 Not Taking    I have reviewed patient's Past Medical Hx, Surgical Hx, Family Hx, Social Hx, medications and allergies.   ROS:  Review of Systems  Constitutional: Positive for fatigue. Negative for chills and fever.  HENT: Negative for congestion and sore throat.   Eyes: Negative for visual disturbance.  Respiratory: Negative for shortness of breath.   Cardiovascular: Positive for leg swelling (now resolved, had it before).  Gastrointestinal: Positive for vomiting.  Negative for abdominal pain, constipation, diarrhea and nausea.  Genitourinary: Negative for dysuria and vaginal bleeding.  Neurological: Positive for dizziness and weakness. Negative for syncope.   Other systems negative  Physical Exam   Patient Vitals for the past 24 hrs:  BP Temp Temp src Pulse Resp SpO2 Height Weight  09/08/17 0247 125/81 98.1 F (36.7 C) Oral 83 16 98 % - -  09/08/17 0239 - - - - - - 5' 8.5" (1.74 m) 94.3 kg   Constitutional: Well-developed, well-nourished female in no acute distress.  Cardiovascular: normal rate and rhythm Respiratory: normal effort, clear to auscultation bilaterally GI: Abd soft, non-tender, gravid appropriate for gestational age.   No rebound or guarding. MS: Extremities nontender, no edema, normal ROM Neurologic: Alert and oriented x 4.  GU: Neg CVAT.  PELVIC EXAM: Dilation: 1.5 Effacement (%): 50 Cervical Position: Posterior Station: -1 Presentation: Vertex Exam by:: Rajohn Henery CNM  FHT:  Baseline 140 , moderate variability, accelerations present, no decelerations Contractions:  Irregular     Labs: Results for orders placed or performed during the hospital encounter of 09/08/17 (from the past 24 hour(s))  Urinalysis, Routine w reflex microscopic     Status: Abnormal   Collection Time: 09/08/17  2:43 AM  Result Value Ref Range   Color, Urine YELLOW YELLOW   APPearance HAZY (A) CLEAR   Specific Gravity, Urine 1.009 1.005 - 1.030   pH 7.0 5.0 - 8.0   Glucose, UA NEGATIVE NEGATIVE mg/dL   Hgb urine dipstick NEGATIVE NEGATIVE   Bilirubin Urine NEGATIVE NEGATIVE   Ketones, ur NEGATIVE NEGATIVE mg/dL   Protein, ur NEGATIVE NEGATIVE mg/dL   Nitrite NEGATIVE NEGATIVE   Leukocytes, UA TRACE (A) NEGATIVE   RBC / HPF 0-5 0 - 5 RBC/hpf   WBC, UA 0-5 0 - 5 WBC/hpf   Bacteria, UA RARE (A) NONE SEEN   Squamous Epithelial / LPF 0-5 0 - 5   Mucus PRESENT    --/--/O NEG (04/30 1357)  Imaging:  No results found.  MAU Course/MDM: I have  ordered labs and reviewed results. Urine shows she is very well hydrated.   NST reviewed, reactive and Category I  Treatments in MAU included Zofran for nausea.  Able to keep down crackers and ginger ale afterward.    Assessment: Single intrauterine pregnancy at 3820w1d Malaise Intermittent swelling without hypertension Vomited once, now resolved  Plan: Discharge home Labor precautions and fetal kick counts Scheduled for IOL on Thursday Follow up in Office for prenatal visits and recheck  Encouraged to return here or to other Urgent Care/ED if she develops worsening of symptoms, increase in pain, fever, or other concerning symptoms.   Pt stable at time of discharge.  Wynelle BourgeoisMarie Rahel Carlton CNM, MSN Certified Nurse-Midwife 09/08/2017 3:03 AM

## 2017-09-08 NOTE — MAU Note (Signed)
Pt reports a lot of swelling in fingers and hands for the past few days. Reports feeling weak and dizzy. States she vomited once on the way here. Reports a HA earlier. Took Tylenol around 8:30pm. Pt denies vaginal bleeding or LOF. Reports good fetal movement.

## 2017-09-08 NOTE — Discharge Instructions (Signed)
Vaginal Delivery Vaginal delivery means that you will give birth by pushing your baby out of your birth canal (vagina). A team of health care providers will help you before, during, and after vaginal delivery. Birth experiences are unique for every woman and every pregnancy, and birth experiences vary depending on where you choose to give birth. What should I do to prepare for my baby's birth? Before your baby is born, it is important to talk with your health care provider about:  Your labor and delivery preferences. These may include: ? Medicines that you may be given. ? How you will manage your pain. This might include non-medical pain relief techniques or injectable pain relief such as epidural analgesia. ? How you and your baby will be monitored during labor and delivery. ? Who may be in the labor and delivery room with you. ? Your feelings about surgical delivery of your baby (cesarean delivery, or C-section) if this becomes necessary. ? Your feelings about receiving donated blood through an IV tube (blood transfusion) if this becomes necessary.  Whether you are able: ? To take pictures or videos of the birth. ? To eat during labor and delivery. ? To move around, walk, or change positions during labor and delivery.  What to expect after your baby is born, such as: ? Whether delayed umbilical cord clamping and cutting is offered. ? Who will care for your baby right after birth. ? Medicines or tests that may be recommended for your baby. ? Whether breastfeeding is supported in your hospital or birth center. ? How long you will be in the hospital or birth center.  How any medical conditions you have may affect your baby or your labor and delivery experience.  To prepare for your baby's birth, you should also:  Attend all of your health care visits before delivery (prenatal visits) as recommended by your health care provider. This is important.  Prepare your home for your baby's  arrival. Make sure that you have: ? Diapers. ? Baby clothing. ? Feeding equipment. ? Safe sleeping arrangements for you and your baby.  Install a car seat in your vehicle. Have your car seat checked by a certified car seat installer to make sure that it is installed safely.  Think about who will help you with your new baby at home for at least the first several weeks after delivery.  What can I expect when I arrive at the birth center or hospital? Once you are in labor and have been admitted into the hospital or birth center, your health care provider may:  Review your pregnancy history and any concerns you have.  Insert an IV tube into one of your veins. This is used to give you fluids and medicines.  Check your blood pressure, pulse, temperature, and heart rate (vital signs).  Check whether your bag of water (amniotic sac) has broken (ruptured).  Talk with you about your birth plan and discuss pain control options.  Monitoring Your health care provider may monitor your contractions (uterine monitoring) and your baby's heart rate (fetal monitoring). You may need to be monitored:  Often, but not continuously (intermittently).  All the time or for long periods at a time (continuously). Continuous monitoring may be needed if: ? You are taking certain medicines, such as medicine to relieve pain or make your contractions stronger. ? You have pregnancy or labor complications.  Monitoring may be done by:  Placing a special stethoscope or a handheld monitoring device on your abdomen to   check your baby's heartbeat, and feeling your abdomen for contractions. This method of monitoring does not continuously record your baby's heartbeat or your contractions.  Placing monitors on your abdomen (external monitors) to record your baby's heartbeat and the frequency and length of contractions. You may not have to wear external monitors all the time.  Placing monitors inside of your uterus  (internal monitors) to record your baby's heartbeat and the frequency, length, and strength of your contractions. ? Your health care provider may use internal monitors if he or she needs more information about the strength of your contractions or your baby's heart rate. ? Internal monitors are put in place by passing a thin, flexible wire through your vagina and into your uterus. Depending on the type of monitor, it may remain in your uterus or on your baby's head until birth. ? Your health care provider will discuss the benefits and risks of internal monitoring with you and will ask for your permission before inserting the monitors.  Telemetry. This is a type of continuous monitoring that can be done with external or internal monitors. Instead of having to stay in bed, you are able to move around during telemetry. Ask your health care provider if telemetry is an option for you.  Physical exam Your health care provider may perform a physical exam. This may include:  Checking whether your baby is positioned: ? With the head toward your vagina (head-down). This is most common. ? With the head toward the top of your uterus (head-up or breech). If your baby is in a breech position, your health care provider may try to turn your baby to a head-down position so you can deliver vaginally. If it does not seem that your baby can be born vaginally, your provider may recommend surgery to deliver your baby. In rare cases, you may be able to deliver vaginally if your baby is head-up (breech delivery). ? Lying sideways (transverse). Babies that are lying sideways cannot be delivered vaginally.  Checking your cervix to determine: ? Whether it is thinning out (effacing). ? Whether it is opening up (dilating). ? How low your baby has moved into your birth canal.  What are the three stages of labor and delivery?  Normal labor and delivery is divided into the following three stages: Stage 1  Stage 1 is the  longest stage of labor, and it can last for hours or days. Stage 1 includes: ? Early labor. This is when contractions may be irregular, or regular and mild. Generally, early labor contractions are more than 10 minutes apart. ? Active labor. This is when contractions get longer, more regular, more frequent, and more intense. ? The transition phase. This is when contractions happen very close together, are very intense, and may last longer than during any other part of labor.  Contractions generally feel mild, infrequent, and irregular at first. They get stronger, more frequent (about every 2-3 minutes), and more regular as you progress from early labor through active labor and transition.  Many women progress through stage 1 naturally, but you may need help to continue making progress. If this happens, your health care provider may talk with you about: ? Rupturing your amniotic sac if it has not ruptured yet. ? Giving you medicine to help make your contractions stronger and more frequent.  Stage 1 ends when your cervix is completely dilated to 4 inches (10 cm) and completely effaced. This happens at the end of the transition phase. Stage 2  Once   your cervix is completely effaced and dilated to 4 inches (10 cm), you may start to feel an urge to push. It is common for the body to naturally take a rest before feeling the urge to push, especially if you received an epidural or certain other pain medicines. This rest period may last for up to 1-2 hours, depending on your unique labor experience.  During stage 2, contractions are generally less painful, because pushing helps relieve contraction pain. Instead of contraction pain, you may feel stretching and burning pain, especially when the widest part of your baby's head passes through the vaginal opening (crowning).  Your health care provider will closely monitor your pushing progress and your baby's progress through the vagina during stage 2.  Your  health care provider may massage the area of skin between your vaginal opening and anus (perineum) or apply warm compresses to your perineum. This helps it stretch as the baby's head starts to crown, which can help prevent perineal tearing. ? In some cases, an incision may be made in your perineum (episiotomy) to allow the baby to pass through the vaginal opening. An episiotomy helps to make the opening of the vagina larger to allow more room for the baby to fit through.  It is very important to breathe and focus so your health care provider can control the delivery of your baby's head. Your health care provider may have you decrease the intensity of your pushing, to help prevent perineal tearing.  After delivery of your baby's head, the shoulders and the rest of the body generally deliver very quickly and without difficulty.  Once your baby is delivered, the umbilical cord may be cut right away, or this may be delayed for 1-2 minutes, depending on your baby's health. This may vary among health care providers, hospitals, and birth centers.  If you and your baby are healthy enough, your baby may be placed on your chest or abdomen to help maintain the baby's temperature and to help you bond with each other. Some mothers and babies start breastfeeding at this time. Your health care team will dry your baby and help keep your baby warm during this time.  Your baby may need immediate care if he or she: ? Showed signs of distress during labor. ? Has a medical condition. ? Was born too early (prematurely). ? Had a bowel movement before birth (meconium). ? Shows signs of difficulty transitioning from being inside the uterus to being outside of the uterus. If you are planning to breastfeed, your health care team will help you begin a feeding. Stage 3  The third stage of labor starts immediately after the birth of your baby and ends after you deliver the placenta. The placenta is an organ that develops  during pregnancy to provide oxygen and nutrients to your baby in the womb.  Delivering the placenta may require some pushing, and you may have mild contractions. Breastfeeding can stimulate contractions to help you deliver the placenta.  After the placenta is delivered, your uterus should tighten (contract) and become firm. This helps to stop bleeding in your uterus. To help your uterus contract and to control bleeding, your health care provider may: ? Give you medicine by injection, through an IV tube, by mouth, or through your rectum (rectally). ? Massage your abdomen or perform a vaginal exam to remove any blood clots that are left in your uterus. ? Empty your bladder by placing a thin, flexible tube (catheter) into your bladder. ? Encourage   you to breastfeed your baby. After labor is over, you and your baby will be monitored closely to ensure that you are both healthy until you are ready to go home. Your health care team will teach you how to care for yourself and your baby. This information is not intended to replace advice given to you by your health care provider. Make sure you discuss any questions you have with your health care provider. Document Released: 10/27/2007 Document Revised: 08/07/2015 Document Reviewed: 02/01/2015 Elsevier Interactive Patient Education  2018 [employer]  

## 2017-09-11 ENCOUNTER — Ambulatory Visit: Payer: Self-pay

## 2017-09-11 ENCOUNTER — Ambulatory Visit (INDEPENDENT_AMBULATORY_CARE_PROVIDER_SITE_OTHER): Payer: Medicaid Other | Admitting: *Deleted

## 2017-09-11 VITALS — BP 133/83 | HR 100 | Wt 204.7 lb

## 2017-09-11 DIAGNOSIS — O480 Post-term pregnancy: Secondary | ICD-10-CM

## 2017-09-11 NOTE — Progress Notes (Signed)
Patient seen and assessed by nursing staff.  Agree with documentation and plan.  

## 2017-09-11 NOTE — Progress Notes (Signed)
NST:  Baseline: 150 bpm, Variability: Good {> 6 bpm), Accelerations: Reactive and Decelerations: Absent    

## 2017-09-11 NOTE — Progress Notes (Signed)

## 2017-09-13 ENCOUNTER — Other Ambulatory Visit: Payer: Self-pay | Admitting: Family Medicine

## 2017-09-14 ENCOUNTER — Inpatient Hospital Stay (HOSPITAL_COMMUNITY): Payer: Medicaid Other | Admitting: Anesthesiology

## 2017-09-14 ENCOUNTER — Inpatient Hospital Stay (HOSPITAL_COMMUNITY)
Admission: RE | Admit: 2017-09-14 | Discharge: 2017-09-16 | DRG: 807 | Disposition: A | Discharge status: Home / Self Care | Payer: Medicaid Other | Attending: Obstetrics & Gynecology | Admitting: Obstetrics & Gynecology

## 2017-09-14 ENCOUNTER — Encounter (HOSPITAL_COMMUNITY): Payer: Self-pay

## 2017-09-14 DIAGNOSIS — D649 Anemia, unspecified: Secondary | ICD-10-CM | POA: Diagnosis present

## 2017-09-14 DIAGNOSIS — O480 Post-term pregnancy: Principal | ICD-10-CM | POA: Diagnosis present

## 2017-09-14 DIAGNOSIS — Z6791 Unspecified blood type, Rh negative: Secondary | ICD-10-CM

## 2017-09-14 DIAGNOSIS — O9902 Anemia complicating childbirth: Secondary | ICD-10-CM | POA: Diagnosis not present

## 2017-09-14 DIAGNOSIS — O26893 Other specified pregnancy related conditions, third trimester: Secondary | ICD-10-CM | POA: Diagnosis not present

## 2017-09-14 DIAGNOSIS — Z3A41 41 weeks gestation of pregnancy: Secondary | ICD-10-CM

## 2017-09-14 DIAGNOSIS — Z87891 Personal history of nicotine dependence: Secondary | ICD-10-CM

## 2017-09-14 DIAGNOSIS — O26899 Other specified pregnancy related conditions, unspecified trimester: Secondary | ICD-10-CM

## 2017-09-14 DIAGNOSIS — Z880 Allergy status to penicillin: Secondary | ICD-10-CM | POA: Diagnosis not present

## 2017-09-14 LAB — TYPE AND SCREEN
ABO/RH(D): O NEG
Antibody Screen: NEGATIVE

## 2017-09-14 LAB — CBC
HEMATOCRIT: 28.8 % — AB (ref 36.0–46.0)
Hemoglobin: 8.9 g/dL — ABNORMAL LOW (ref 12.0–15.0)
MCH: 24.1 pg — AB (ref 26.0–34.0)
MCHC: 30.9 g/dL (ref 30.0–36.0)
MCV: 77.8 fL — AB (ref 78.0–100.0)
Platelets: 190 10*3/uL (ref 150–400)
RBC: 3.7 MIL/uL — ABNORMAL LOW (ref 3.87–5.11)
RDW: 17.1 % — AB (ref 11.5–15.5)
WBC: 16.9 10*3/uL — AB (ref 4.0–10.5)

## 2017-09-14 MED ORDER — ACETAMINOPHEN 325 MG PO TABS
650.0000 mg | ORAL_TABLET | ORAL | Status: DC | PRN
  Administered 2017-09-15 – 2017-09-16 (×3): 650 mg via ORAL
  Filled 2017-09-14 (×3): qty 2

## 2017-09-14 MED ORDER — DIPHENHYDRAMINE HCL 25 MG PO CAPS: 25.0000 mg | ORAL_CAPSULE | Freq: Four times a day (QID) | ORAL | Status: DC | PRN

## 2017-09-14 MED ORDER — DIPHENHYDRAMINE HCL 50 MG/ML IJ SOLN: 12.5000 mg | INTRAMUSCULAR | Status: DC | PRN

## 2017-09-14 MED ORDER — TERBUTALINE SULFATE 1 MG/ML IJ SOLN
0.2500 mg | Freq: Once | INTRAMUSCULAR | Status: AC | PRN
  Administered 2017-09-14: 0.25 mg via SUBCUTANEOUS
  Filled 2017-09-14: qty 1

## 2017-09-14 MED ORDER — PRENATAL MULTIVITAMIN CH
1.0000 | ORAL_TABLET | Freq: Every day | ORAL | Status: DC
  Administered 2017-09-15 – 2017-09-16 (×2): 1 via ORAL
  Filled 2017-09-14 (×2): qty 1

## 2017-09-14 MED ORDER — PHENYLEPHRINE 40 MCG/ML (10ML) SYRINGE FOR IV PUSH (FOR BLOOD PRESSURE SUPPORT)
80.0000 ug | PREFILLED_SYRINGE | INTRAVENOUS | Status: DC | PRN
  Filled 2017-09-14: qty 10
  Filled 2017-09-14: qty 5

## 2017-09-14 MED ORDER — ONDANSETRON HCL 4 MG/2ML IJ SOLN: 4.0000 mg | INTRAMUSCULAR | Status: DC | PRN

## 2017-09-14 MED ORDER — LACTATED RINGERS IV SOLN: 500.0000 mL | INTRAVENOUS | Status: DC | PRN

## 2017-09-14 MED ORDER — IBUPROFEN 600 MG PO TABS
600.0000 mg | ORAL_TABLET | Freq: Four times a day (QID) | ORAL | Status: DC
  Administered 2017-09-14 – 2017-09-16 (×9): 600 mg via ORAL
  Filled 2017-09-14 (×9): qty 1

## 2017-09-14 MED ORDER — WITCH HAZEL-GLYCERIN EX PADS: 1.0000 "application " | MEDICATED_PAD | CUTANEOUS | Status: DC | PRN

## 2017-09-14 MED ORDER — COCONUT OIL OIL
1.0000 "application " | TOPICAL_OIL | Status: DC | PRN
  Administered 2017-09-16: 1 via TOPICAL
  Filled 2017-09-14: qty 120

## 2017-09-14 MED ORDER — BENZOCAINE-MENTHOL 20-0.5 % EX AERO
1.0000 "application " | INHALATION_SPRAY | CUTANEOUS | Status: DC | PRN
  Administered 2017-09-14: 1 via TOPICAL
  Filled 2017-09-14 (×2): qty 56

## 2017-09-14 MED ORDER — EPHEDRINE 5 MG/ML INJ
10.0000 mg | INTRAVENOUS | Status: DC | PRN
  Filled 2017-09-14: qty 2

## 2017-09-14 MED ORDER — SOD CITRATE-CITRIC ACID 500-334 MG/5ML PO SOLN: 30.0000 mL | ORAL | Status: DC | PRN

## 2017-09-14 MED ORDER — SIMETHICONE 80 MG PO CHEW: 80.0000 mg | CHEWABLE_TABLET | ORAL | Status: DC | PRN

## 2017-09-14 MED ORDER — MISOPROSTOL 50MCG HALF TABLET: 50.0000 ug | ORAL_TABLET | ORAL | Status: DC | PRN

## 2017-09-14 MED ORDER — LIDOCAINE HCL (PF) 1 % IJ SOLN
INTRAMUSCULAR | Status: DC | PRN
  Administered 2017-09-14: 4 mL
  Administered 2017-09-14: 9 mL via EPIDURAL
  Administered 2017-09-14: 4 mL

## 2017-09-14 MED ORDER — FENTANYL 2.5 MCG/ML BUPIVACAINE 1/10 % EPIDURAL INFUSION (WH - ANES)
14.0000 mL/h | INTRAMUSCULAR | Status: DC | PRN
  Administered 2017-09-14 (×2): 14 mL/h via EPIDURAL
  Filled 2017-09-14 (×2): qty 100

## 2017-09-14 MED ORDER — MISOPROSTOL 50MCG HALF TABLET
50.0000 ug | ORAL_TABLET | ORAL | Status: DC | PRN
  Administered 2017-09-14: 50 ug via ORAL
  Filled 2017-09-14 (×2): qty 1

## 2017-09-14 MED ORDER — OXYTOCIN 40 UNITS IN LACTATED RINGERS INFUSION - SIMPLE MED
2.5000 [IU]/h | INTRAVENOUS | Status: DC
  Administered 2017-09-14: 2.5 [IU]/h via INTRAVENOUS
  Filled 2017-09-14: qty 1000

## 2017-09-14 MED ORDER — TETANUS-DIPHTH-ACELL PERTUSSIS 5-2.5-18.5 LF-MCG/0.5 IM SUSP: 0.5000 mL | Freq: Once | INTRAMUSCULAR | Status: DC

## 2017-09-14 MED ORDER — LACTATED RINGERS IV SOLN
500.0000 mL | Freq: Once | INTRAVENOUS | Status: AC
  Administered 2017-09-14: 500 mL via INTRAVENOUS

## 2017-09-14 MED ORDER — ONDANSETRON HCL 4 MG/2ML IJ SOLN: 4.0000 mg | Freq: Four times a day (QID) | INTRAMUSCULAR | Status: DC | PRN

## 2017-09-14 MED ORDER — FLEET ENEMA 7-19 GM/118ML RE ENEM: 1.0000 | ENEMA | RECTAL | Status: DC | PRN

## 2017-09-14 MED ORDER — SENNOSIDES-DOCUSATE SODIUM 8.6-50 MG PO TABS
2.0000 | ORAL_TABLET | ORAL | Status: DC
  Administered 2017-09-14 – 2017-09-16 (×2): 2 via ORAL
  Filled 2017-09-14 (×2): qty 2

## 2017-09-14 MED ORDER — OXYTOCIN BOLUS FROM INFUSION
500.0000 mL | Freq: Once | INTRAVENOUS | Status: AC
  Administered 2017-09-14: 500 mL via INTRAVENOUS

## 2017-09-14 MED ORDER — PHENYLEPHRINE 40 MCG/ML (10ML) SYRINGE FOR IV PUSH (FOR BLOOD PRESSURE SUPPORT)
80.0000 ug | PREFILLED_SYRINGE | INTRAVENOUS | Status: DC | PRN
  Filled 2017-09-14: qty 5

## 2017-09-14 MED ORDER — LIDOCAINE HCL (PF) 1 % IJ SOLN
30.0000 mL | INTRAMUSCULAR | Status: DC | PRN
  Filled 2017-09-14: qty 30

## 2017-09-14 MED ORDER — DIBUCAINE 1 % RE OINT: 1.0000 "application " | TOPICAL_OINTMENT | RECTAL | Status: DC | PRN

## 2017-09-14 MED ORDER — LACTATED RINGERS IV SOLN
INTRAVENOUS | Status: DC
  Administered 2017-09-14 (×4): via INTRAVENOUS

## 2017-09-14 MED ORDER — ONDANSETRON HCL 4 MG PO TABS: 4.0000 mg | ORAL_TABLET | ORAL | Status: DC | PRN

## 2017-09-14 MED ORDER — FENTANYL CITRATE (PF) 100 MCG/2ML IJ SOLN
100.0000 ug | INTRAMUSCULAR | Status: DC | PRN
Start: 2017-09-14 — End: 2017-09-14
  Administered 2017-09-14: 100 ug via INTRAVENOUS
  Filled 2017-09-14: qty 2

## 2017-09-14 NOTE — Anesthesia Postprocedure Evaluation (Signed)
Anesthesia Post Note  Patient: Sherry Leach  Procedure(s) Performed: AN AD HOC LABOR EPIDURAL     Patient location during evaluation: Mother Baby Anesthesia Type: Epidural Level of consciousness: awake and alert Pain management: pain level controlled Vital Signs Assessment: post-procedure vital signs reviewed and stable Respiratory status: spontaneous breathing, nonlabored ventilation and respiratory function stable Cardiovascular status: stable Postop Assessment: no headache, no backache and epidural receding Anesthetic complications: no    Last Vitals:  Vitals:   09/14/17 1520 09/14/17 1619  BP: 131/90 128/80  Pulse: 87 (!) 114  Resp: 18 18  Temp: 36.8 C 37.1 C  SpO2: 100% 100%    Last Pain:  Vitals:   09/14/17 1727  TempSrc:   PainSc: 7    Pain Goal:                 EchoStarMERRITT,Niels Cranshaw

## 2017-09-14 NOTE — Lactation Note (Signed)
This note was copied from a baby's chart. Lactation Consultation Note  Patient Name: Sherry Leach ZOXWR'UToday's Date: 09/14/2017 Reason for consult: Initial assessment;Primapara;1st time breastfeeding;Term  7 hours old FT female who is being exclusively BF by her mother; she's a P1. Mom didn't take any BF classes but she already knows how to hand express, LC reviewed hand expression technique with her and she was able to get a big drop of colostrum out of the left breast. Mom also voiced that BF is very natural for her because everyone in her family has BF and she plans to do the same, visitors nodded, they were very supportive. Mom has a DEBP at home.  LC had to come back twice to the room because mom was on the phone (on hold) the first time and asked LC to come back later. The second time LC also offered latch assistance but mom politely declined again stating that she'd like to try in another 10-15 minutes. Asked mom to call her RN for latch assistance the next time in case LC is in a different room; explained to mom there's only one LC per evening shift tonight.  Per mom feedings at the breast are comfortable and she's been able to hear baby swallowing when at the breast. Both of her nipples looked intact with no signs of trauma. Encouraged mom to feed baby STS 8-12 times/24 hours or sooner if feeding cues are present. BF brochure, BF resources and feeding diary were also reviewed, mom is aware of LC services and will call PRN.  Maternal Data Formula Feeding for Exclusion: No Has patient been taught Hand Expression?: Yes Does the patient have breastfeeding experience prior to this delivery?: No  Feeding Feeding Type: Breast Milk  Interventions Interventions: Breast feeding basics reviewed;Breast massage;Breast compression;Hand express  Lactation Tools Discussed/Used WIC Program: No   Consult Status Consult Status: Follow-up Date: 09/15/17 Follow-up type: In-patient    Ibtisam Benge Venetia ConstableS  Madysyn Hanken 09/14/2017, 8:17 PM

## 2017-09-14 NOTE — Lactation Note (Signed)
This note was copied from a baby's chart. Lactation Consultation Note  Patient Name: Sherry Earlie Louiera Soberanes WUJWJ'XToday's Date: 09/14/2017 Reason for consult: Follow-up assessment;Primapara;1st time breastfeeding;Term  Mom called again for latch assistance, she was very cooperative this time, put baby STS only left mittens on (besides diaper and hat) because baby kept scratching her face. LC took baby STS in cross cradle position and she was able to latch easily, with several swallows heard; mom was very pleased, praised her for her efforts.  Baby still nursing when exiting the room, mom is aware there's no LC overnight, but she'll call her RN for assistance if needed. Let mom know that Lactation services will be available again tomorrow morning at 7 am. Discussed cluster feeding and baby sleeping cycle, mom had a support person in the room with her and will try to get some sleep tonight.  Maternal Data Formula Feeding for Exclusion: No Has patient been taught Hand Expression?: Yes Does the patient have breastfeeding experience prior to this delivery?: No  Feeding Feeding Type: Breast Fed Length of feed: 10 min  LATCH Score Latch: Grasps breast easily, tongue down, lips flanged, rhythmical sucking.  Audible Swallowing: A few with stimulation(several swallows with with breast compressions)  Type of Nipple: Everted at rest and after stimulation  Comfort (Breast/Nipple): Soft / non-tender  Hold (Positioning): Assistance needed to correctly position infant at breast and maintain latch.  LATCH Score: 8  Interventions Interventions: Breast feeding basics reviewed;Assisted with latch;Skin to skin;Breast massage;Breast compression;Adjust position;Support pillows;Position options  Lactation Tools Discussed/Used WIC Program: No   Consult Status Consult Status: Follow-up Date: 09/15/17 Follow-up type: In-patient    Ethelbert Thain [name] 09/14/2017, 10:54 PM

## 2017-09-14 NOTE — H&P (Addendum)
LABOR AND DELIVERY ADMISSION HISTORY AND PHYSICAL NOTE  Sherry Leach is a 22 y.o. female G1P0 with IUP at 6314w0d by u/s presenting for IOL due to postdates.  She reports positive fetal movement. She denies leakage of fluid or vaginal bleeding. She is feeling 2-3 contractions per hour. She is having a girl, planning to breastfeed, and would like to use progesterone-only pill for contraception after delivery.  [facility]Com[facility] Pregnancy complications:  - Rh negative  Past Medical History: Past Medical History:  Diagnosis Date  . Medical history non-contributory   . Seasonal allergies     Past Surgical History: Past Surgical History:  Procedure Laterality Date  . MOUTH SURGERY      Obstetrical History: OB History    Gravida  1   Para      Term      Preterm      AB      Living  0     SAB      TAB      Ectopic      Multiple      Live Births              Social History: Social History   Socioeconomic History  . Marital status: Single    Spouse name: Not on file  . Number of children: Not on file  . Years of education: Not on file  . Highest education level: Not on file  Occupational History  . Not on file  Social Needs  . Financial resource strain: Not on file  . Food insecurity:    Worry: Not on file    Inability: Not on file  . Transportation needs:    Medical: Not on file    Non-medical: Not on file  Tobacco Use  . Smoking status: Former Smoker    Packs/day: 0.50    Last attempt to quit: 01/01/2017    Years since quitting: 0.7  . Smokeless tobacco: Never Used  Substance and Sexual Activity  . Alcohol use: No  . Drug use: No  . Sexual activity: Yes    Birth control/protection: None  Lifestyle  . Physical activity:    Days per week: Not on file    Minutes per session: Not on file  . Stress: Not on file  Relationships  . Social connections:    Talks on phone: Not on file    Gets together: Not on file   Attends religious service: Not on file    Active member of club or organization: Not on file    Attends meetings of clubs or organizations: Not on file    Relationship status: Not on file  Other Topics Concern  . Not on file  Social History Narrative  . Not on file    Family History: Family History  Problem Relation Age of Onset  . Hypertension Mother   . Hypertension Other     Allergies: Allergies  Allergen Reactions  . Amoxicillin Rash    Has patient had a PCN reaction causing immediate rash, facial/tongue/throat swelling, SOB or lightheadedness with hypotension: No Has patient had a PCN reaction causing severe rash involving mucus membranes or skin necrosis: No Has patient had a PCN reaction that required hospitalization: No Has patient had a PCN reaction occurring within the last 10 years: Yes If all of the above answers are "NO", then may proceed with Cephalosporin use.   Marland Kitchen. Penicillins Rash    Has patient had a PCN  reaction causing immediate rash, facial/tongue/throat swelling, SOB or lightheadedness with hypotension: No Has patient had a PCN reaction causing severe rash involving mucus membranes or skin necrosis: No Has patient had a PCN reaction that required hospitalization: No Has patient had a PCN reaction occurring within the last 10 years: Yes If all of the above answers are "NO", then may proceed with Cephalosporin use.     Medications Prior to Admission  Medication Sig Dispense Refill Last Dose  . Prenatal Vit-Fe Fumarate-FA (PRENATAL MULTIVITAMIN) TABS tablet Take 1 tablet by mouth daily at 12 noon.   09/13/2017 at Unknown time  . acetaminophen (TYLENOL) 325 MG tablet Take 325 mg by mouth every 6 (six) hours as needed for mild pain.   09/07/2017 at Unknown time  . Calcium Carbonate Antacid (TUMS PO) Take by mouth.   Past Week at Unknown time  . cephALEXin (KEFLEX) 500 MG capsule Take 500 mg by mouth daily.   09/07/2017 at Unknown time  . cyclobenzaprine (FLEXERIL)  10 MG tablet Take 1 tablet (10 mg total) by mouth 3 (three) times daily as needed for muscle spasms. (Patient not taking: Reported on 08/31/2017) 30 tablet 0 Not Taking     Review of Systems  All systems reviewed and negative except as stated in HPI  Physical Exam Blood pressure 139/83, pulse 81, temperature 98.5 F (36.9 C), temperature source Oral, resp. rate 16, height 5' 8.5" (1.74 m), weight 94.2 kg, last menstrual period 12/01/2016, unknown if currently breastfeeding. General appearance: alert, oriented, NAD Lungs: normal respiratory effort Heart: regular rate Abdomen: soft, non-tender; gravid, FH appropriate for GA Extremities: No calf swelling or tenderness Presentation: cephalic by exam Fetal monitoring: Baseline 130 bpm, moderate variability, +acels, no decel Uterine activity: occasional Dilation: 1 Effacement (%): 20, 30 Station: -3 Exam by:: Dr Truddie Cocoell  Prenatal labs: ABO, Rh: --/--/O NEG (04/30 1357) Antibody: NEG (04/30 1357) Rubella: 1.02 (01/21 1211) RPR: Non Reactive (05/20 0958)  HBsAg: Negative (01/21 1211)  HIV: Non Reactive (05/20 0958)  GC/Chlamydia: Negative (08/09/17) GBS: Negative (08/09/17)     2-hr GTT: Passed (06/19/17) Genetic screening: Negative (03/03/17) Anatomy US: Normal (most recent 5/24 showed previously seen UTD resolved)  Prenatal Transfer Tool  Maternal Diabetes: No Genetic Screening: Normal Maternal Ultrasounds/Referrals: Normal Fetal Ultrasounds or other Referrals:  None Maternal Substance Abuse:  No Significant Maternal Medications:  None Significant Maternal Lab Results: Lab values include: Rh negative  Results for orders placed or performed during the hospital encounter of 09/14/17 (from the past 24 hour(s))  CBC   Collection Time: 09/14/17  1:15 AM  Result Value Ref Range   WBC 16.9 (H) 4.0 - 10.5 K/uL   RBC 3.70 (L) 3.87 - 5.11 MIL/uL   Hemoglobin 8.9 (L) 12.0 - 15.0 g/dL   HCT 16.128.8 (L) 09.636.0 - 04.546.0 %   MCV 77.8 (L) 78.0 - 100.0  fL   MCH 24.1 (L) 26.0 - 34.0 pg   MCHC 30.9 30.0 - 36.0 g/dL   RDW 40.917.1 (H) 81.111.5 - 91.415.5 %   Platelets 190 150 - 400 K/uL    Patient Active Problem List   Diagnosis Date Noted  . Post-dates pregnancy 09/14/2017  . Rh negative state in antepartum period 09/04/2017  . Supervision of normal first pregnancy, antepartum 02/20/2017    Assessment: Sherry Leach is a 22 y.o. G1P0 at 7743w0d here for induction for postdates.  #Labor: IOL with cytotec #Pain: Epidural/IV pain medication per patient request #FWB: Cat I #ID: GBS negative  #  MOF: Breast #MOC: POP #Circ: n/a  Natasha Mead, MS3 09/14/2017, 2:06 AM   OB FELLOW HISTORY AND PHYSICAL ATTESTATION  I have seen and examined this patient; I agree with above documentation in the student's note.   Sherry Leach is 22 y.o. G1P0 female at [redacted]w[redacted]d who presents for IOL 2/2 postdates. Pregnancy complicated by Rh- status, pyelonephritis in pregnancy, h/o Chlamydia infection with negative TOC. Patient is having baby girl, planning to breastfeed, and requests POPs for contraception after delivery.   Dilation: 1 Effacement (%): 20, 30 Cervical Position: Posterior Station: -3 Presentation: Vertex Exam by:: Dr Cleon Dew induction with Cytotec. Cat I strip open admission. Approximately one hour after administration of Cytotec, repetitive decels to 70-90 bpm noted. Confirmation of changes in HR with bedside sono. Position changes, O2 and terbutaline given. HR stabilized with compensatory tachycardia noted. Will plan to continue to monitor. Avoid further use of Cytotec. Will plan for induction with FB and Pitocin.   Marcy Siren, D.O. OB Fellow  09/14/2017, 3:18 AM

## 2017-09-14 NOTE — Anesthesia Preprocedure Evaluation (Signed)
Anesthesia Evaluation  Patient identified by MRN, date of birth, ID band Patient awake    Reviewed: Allergy & Precautions, Patient's Chart, lab work & pertinent test results  Airway Mallampati: II  TM Distance: >3 FB Neck ROM: Full    Dental   Pulmonary former smoker,    Pulmonary exam normal breath sounds clear to auscultation       Cardiovascular negative cardio ROS Normal cardiovascular exam Rhythm:Regular Rate:Normal     Neuro/Psych negative neurological ROS  negative psych ROS   GI/Hepatic negative GI ROS, Neg liver ROS,   Endo/Other  negative endocrine ROS  Renal/GU negative Renal ROS     Musculoskeletal negative musculoskeletal ROS (+)   Abdominal   Peds  Hematology negative hematology ROS (+)   Anesthesia Other Findings   Reproductive/Obstetrics (+) Pregnancy                             Anesthesia Physical Anesthesia Plan  ASA: II  Anesthesia Plan: Epidural   Post-op Pain Management:    Induction:   PONV Risk Score and Plan:   Airway Management Planned:   Additional Equipment:   Intra-op Plan:   Post-operative Plan:   Informed Consent: I have reviewed the patients History and Physical, chart, labs and discussed the procedure including the risks, benefits and alternatives for the proposed anesthesia with the patient or authorized representative who has indicated his/her understanding and acceptance.     Plan Discussed with:   Anesthesia Plan Comments:         Anesthesia Quick Evaluation

## 2017-09-14 NOTE — Anesthesia Pain Management Evaluation Note (Signed)
  CRNA Pain Management Visit Note  Patient: Sherry Leach, 22 y.o., female  "Hello I am a [facility]. We have an anesthesia team available at all times to provide care throughout the hospital, including epidural management and anesthesia for C-section. I don't know your plan for the delivery whether it a natural birth, water birth, IV sedation, nitrous supplementation, doula or epidural, but we want to meet your pain goals."   1.Was your pain managed to your expectations on prior hospitalizations?   No prior hospitalizations  2.What is your expectation for pain management during this hospitalization?     Epidural  3.How can we help you reach that goal? epidural  Record the patient's initial score and the patient's pain goal.   Pain: 3  Pain Goal: 4 [facility] wants you to be able to say your pain was always managed very well.  Euell Schiff 09/14/2017

## 2017-09-14 NOTE — Anesthesia Procedure Notes (Signed)
Epidural Patient location during procedure: OB Start time: 09/14/2017 8:20 AM End time: 09/14/2017 8:45 AM  Staffing Anesthesiologist: Lewie LoronGermeroth, Meleane Selinger, MD Performed: anesthesiologist   Preanesthetic Checklist Completed: patient identified, pre-op evaluation, timeout performed, IV checked, risks and benefits discussed and monitors and equipment checked  Epidural Patient position: sitting Prep: ChloraPrep Patient monitoring: blood pressure Approach: midline Location: L3-L4 Injection technique: LOR saline and LOR air  Needle:  Needle type: Tuohy  Needle gauge: 17 G Needle length: 9 cm Needle insertion depth: 5 cm Catheter type: closed end flexible Catheter size: 19 Gauge Catheter at skin depth: 10 cm Test dose: negative  Assessment Sensory level: T8 Events: blood not aspirated, injection not painful, no injection resistance, negative IV test and no paresthesia  Additional Notes Informed consent obtained prior to proceeding including risk of failure, 1% risk of PDPH, risk of minor discomfort and bruising.  Discussed rare but serious complications including epidural abscess, permanent nerve injury, epidural hematoma.  Discussed alternatives to epidural analgesia and patient desires to proceed.  Timeout performed pre-procedure verifying patient name, procedure, and platelet count.  Patient tolerated procedure well.  Catheter secured at 10cm at the skin.  SA test negative as confirmed by no motor block of hip abduction at 5 min post injection of 50 mg of lidocaine into the epidural catheter.  Bupivacaine 0.1% with fentanyl 2.415mcg/ml infused post procedure at 8512ml/hr with PCEA of 9ml every 10 mins.  Reason for block:at surgeon's request

## 2017-09-15 LAB — RPR: RPR: NONREACTIVE

## 2017-09-15 NOTE — Progress Notes (Signed)
At 0820 upon entering patients room to assess her and her baby, Mom states she has been a little dizzy when she first gets OOB. Pt's vitals signs stable, fundus firm and lochia small. Pt ambulated to the bathroom with minimal assist and ambulated back to bed without any difficulties. Encouraged pt to call for assistance and let the nurses know if the dizziness persist. Encouraged pt to order breakfast and po fluids.

## 2017-09-16 ENCOUNTER — Ambulatory Visit: Payer: Self-pay

## 2017-09-16 MED ORDER — NORETHINDRONE 0.35 MG PO TABS: 1.0000 | ORAL_TABLET | Freq: Every day | ORAL | 3 refills | Status: DC

## 2017-09-16 MED ORDER — IBUPROFEN 600 MG PO TABS: 600.0000 mg | ORAL_TABLET | Freq: Four times a day (QID) | ORAL | 1 refills | Status: DC | PRN

## 2017-09-16 NOTE — Lactation Note (Addendum)
This note was copied from a baby's chart. Lactation Consultation Note  Patient Name: Sherry Leach WUJWJ'XToday's Date: 09/16/2017 Reason for consult: Follow-up assessment  P1, Baby 45 hours old and mother's breasts are filling and tender. She is applying lanolin upon entering.  Suggest using ebm or coconut oil which was provided. Mom has my # to call for assist w/next feeding.  Mother called to observe feeding. Baby latched on tip of nipple w/ sleeping feeding. Mother's positioning is good but baby does not appear hungry.  Since mother's breasts are full, suggest pumping and allowing baby to sleep. Wake baby is she does not feed within 3 hours due to her size and give her back any pumping breastmilk. Mom encouraged to feed baby 8-12 times/24 hours and with feeding cues.  Reviewed how to compress breast during feeding in cross cradle hold to keep baby active.  Reviewed engorgement care and monitoring voids/stools. Encouraged mother to call breastfeeding line for more assistance or OP if needed.    Maternal Data    Feeding Feeding Type: Breast Milk Length of feed: 18 min  LATCH Score Latch: Repeated attempts needed to sustain latch, nipple held in mouth throughout feeding, stimulation needed to elicit sucking reflex.  Audible Swallowing: A few with stimulation  Type of Nipple: Everted at rest and after stimulation  Comfort (Breast/Nipple): Soft / non-tender  Hold (Positioning): Assistance needed to correctly position infant at breast and maintain latch.  LATCH Score: 7  Interventions Interventions: Coconut oil;Expressed milk  Lactation Tools Discussed/Used     Consult Status Consult Status: Follow-up Date: 09/17/17 Follow-up type: In-patient    Dahlia ByesBerkelhammer, [facility] 09/16/2017, 10:14 AM

## 2017-09-16 NOTE — Lactation Note (Addendum)
This note was copied from a baby's chart. Lactation Consultation Note  Patient Name: Sherry Leach QMVHQ'IToday's Date: 09/16/2017 Reason for consult: Follow-up assessment  I was asked by RN to see infant, who was now a baby patient. Mom's milk has come to volume. Mom was assisted with latching infant on R breast, using an asymmetrical latch. (Specifics of an asymmetric latch shown via [employer] website animation). Mom has discomfort with the initial latch, but it then subsides.  Infant was noted to have a suck:swallow ratio of 1:1. Mom reports that her R breast feels softer after "Sherry Leach" finishes feeding, but her R breast fills back up quickly. Sherry Leach is not latching to the L breast at this time.   Mom has had nipple piercings previously.    Sherry Leach, [facility]ach [facility] 09/16/2017, 7:36 PM

## 2017-09-16 NOTE — Discharge Summary (Signed)
OB Discharge Summary  Patient Name: Sherry Leach DOB: 04/07/1995 MRN: [id number]  Date of admission: 09/14/2017 Delivering MD: Donette LarryBHAMBRI, MELANIE   Date of discharge: 09/16/2017  Admitting diagnosis: INDUCTION Intrauterine pregnancy: 3116w0d     Secondary diagnosis:Active Problems:   Rh negative state in antepartum period   Post-dates pregnancy   Vaginal delivery  Additional problems:none     Discharge diagnosis: Term Pregnancy Delivered and Anemia                                                                     Post partum procedures:none, infant was Rh neg and mom did not need rhogam  Augmentation: Cytotec  Complications: None  Hospital course:  Induction of Labor With Vaginal Delivery   22 y.o. yo G1P1001 at 5916w0d was admitted to the hospital 09/14/2017 for induction of labor.  Indication for induction: Postdates.  Patient had an uncomplicated labor course as follows: Membrane Rupture Time/Date: 1:05 PM ,09/14/2017   Intrapartum Procedures: Episiotomy: None [1]                                         Lacerations:  1st degree [2];Labial [10]  Patient had delivery of a Viable infant.  Information for the patient's newborn:  Sherry Leach [[id number]][[id number]]  Delivery Method: Vag-Spont   09/14/2017  Details of delivery can be found in separate delivery note.  Patient had a routine postpartum course. Patient is discharged home 09/16/17.  Physical exam  Vitals:   09/15/17 0830 09/15/17 1500 09/15/17 2249 09/16/17 0707  BP: 125/81 130/79 133/74 119/69  Pulse: 84 (!) 120 (!) 109 100  Resp: 18 18 18 18   Temp: 98.2 F (36.8 C) 98.3 F (36.8 C) 98.4 F (36.9 C) 98.1 F (36.7 C)  TempSrc: Oral Oral Oral Oral  SpO2: 100%     Weight:      Height:       General: alert, cooperative and no distress Lochia: appropriate Uterine Fundus: firm DVT Evaluation: No evidence of DVT seen on physical exam. Labs: Lab Results  Component Value Date   WBC 16.9 (H) 09/14/2017   HGB 8.9  (L) 09/14/2017   HCT 28.8 (L) 09/14/2017   MCV 77.8 (L) 09/14/2017   PLT 190 09/14/2017   CMP Latest Ref Rng & Units 06/12/2017  Glucose 65 - 99 mg/dL 73  BUN 6 - 20 mg/dL 5(L)  Creatinine 8.410.57 - 1.00 mg/dL 3.24(M0.52(L)  Sodium 010134 - 272144 mmol/L 137  Potassium 3.5 - 5.2 mmol/L 3.6  Chloride 96 - 106 mmol/L 102  CO2 20 - 29 mmol/L 20  Calcium 8.7 - 10.2 mg/dL 9.9  Total Protein 6.0 - 8.5 g/dL 6.7  Total Bilirubin 0.0 - 1.2 mg/dL <5.3<0.2  Alkaline Phos 39 - 117 IU/L 70  AST 0 - 40 IU/L 18  ALT 0 - 32 IU/L 15    Discharge instruction: per After Visit Summary and "Baby and Me Booklet".  After Visit Meds:  Allergies as of 09/16/2017      Reactions   Amoxicillin Rash   Has patient had a PCN reaction causing immediate rash, facial/tongue/throat  swelling, SOB or lightheadedness with hypotension: No Has patient had a PCN reaction causing severe rash involving mucus membranes or skin necrosis: No Has patient had a PCN reaction that required hospitalization: No Has patient had a PCN reaction occurring within the last 10 years: Yes If all of the above answers are "NO", then may proceed with Cephalosporin use.   Penicillins Rash   Has patient had a PCN reaction causing immediate rash, facial/tongue/throat swelling, SOB or lightheadedness with hypotension: No Has patient had a PCN reaction causing severe rash involving mucus membranes or skin necrosis: No Has patient had a PCN reaction that required hospitalization: No Has patient had a PCN reaction occurring within the last 10 years: Yes If all of the above answers are "NO", then may proceed with Cephalosporin use.      Medication List    STOP taking these medications   cephALEXin 500 MG capsule Commonly known as:  KEFLEX   cyclobenzaprine 10 MG tablet Commonly known as:  FLEXERIL     TAKE these medications   acetaminophen 325 MG tablet Commonly known as:  TYLENOL Take 325 mg by mouth every 6 (six) hours as needed for mild pain.    ibuprofen 600 MG tablet Commonly known as:  ADVIL,MOTRIN Take 1 tablet (600 mg total) by mouth every 6 (six) hours as needed.   norethindrone 0.35 MG tablet Commonly known as:  MICRONOR,CAMILA,ERRIN Take 1 tablet (0.35 mg total) by mouth daily. Begin 4-6 wks after delivery   prenatal multivitamin Tabs tablet Take 1 tablet by mouth daily at 12 noon.   TUMS PO Take 2 tablets by mouth daily as needed (heartburn).       Diet: routine diet  Activity: Advance as tolerated. Pelvic rest for 6 weeks.   Outpatient follow up:4 weeks Follow up Appt: Future Appointments  Date Time Provider Department Center  10/23/2017  1:55 PM Marylene LandKooistra, Kathryn Lorraine, CNM [facility]   Follow up visit: No follow-ups on file.  Postpartum contraception: Progesterone only pills Please schedule this patient for Postpartum visit in: 4 weeks with the following provider: Any provider For C/S patients schedule nurse incision check in weeks 2 weeks: n/a Low risk pregnancy complicated by: Rh (-) Delivery mode:  SVD Anticipated Birth Control:  POP PP Procedures needed: none  Schedule Integrated BH visit: no Newborn Data: Live born female  Birth Weight: 6 lb 12.8 oz (3085 g) APGAR: 8, 9  Newborn Delivery   Birth date/time:  09/14/2017 13:13:00 Delivery type:  Vaginal, Spontaneous     Baby Feeding: Breast Disposition:home with mother   09/16/2017 Sherry Boresanya S Hadrian Yarbrough, MD

## 2017-09-16 NOTE — Lactation Note (Signed)
This note was copied from a baby's chart. Lactation Consultation Note  Patient Name: Sherry Leach ZOXWR'UToday's Date: 09/16/2017 Reason for consult: Follow-up assessment   Mother called.  She recently breastfed baby for 10 min and because she was sleepy she supplemented w/ 10 ml of breastmilk. Reassured mother to continue plan at home if baby is sleepy at the breast. Mother has lactation dept phone number to call for questions. Mother has personal DEBP at home.    Maternal Data    Feeding Feeding Type: Breast Fed Length of feed: 10 min  LATCH Score Latch: Repeated attempts needed to sustain latch, nipple held in mouth throughout feeding, stimulation needed to elicit sucking reflex.  Audible Swallowing: A few with stimulation  Type of Nipple: Everted at rest and after stimulation  Comfort (Breast/Nipple): Filling, red/small blisters or bruises, mild/mod discomfort  Hold (Positioning): Assistance needed to correctly position infant at breast and maintain latch.  LATCH Score: 6  Interventions Interventions: Assisted with latch;Skin to skin;Hand express;Breast compression  Lactation Tools Discussed/Used     Consult Status Consult Status: Complete Date: 09/17/17 Follow-up type: In-patient    Dahlia ByesBerkelhammer, Sherry Leach 09/16/2017, 12:12 PM

## 2017-09-16 NOTE — Progress Notes (Signed)
Pt states she has had a clot with each pad change. Last clot about half dollar size and that was the largest so far. Clayton BiblesSamantha Weinhold CNM notified. Will come see pt

## 2017-09-16 NOTE — Discharge Instructions (Signed)
Postpartum Care After Vaginal Delivery °The period of time right after you deliver your newborn is called the postpartum period. °What kind of medical care will I receive? °· You may continue to receive fluids and medicines through an IV tube inserted into one of your veins. °· If an incision was made near your vagina (episiotomy) or if you had some vaginal tearing during delivery, cold compresses may be placed on your episiotomy or your tear. This helps to reduce pain and swelling. °· You may be given a squirt bottle to use when you go to the bathroom. You may use this until you are comfortable wiping as usual. To use the squirt bottle, follow these steps: °? Before you urinate, fill the squirt bottle with warm water. Do not use hot water. °? After you urinate, while you are sitting on the toilet, use the squirt bottle to rinse the area around your urethra and vaginal opening. This rinses away any urine and blood. °? You may do this instead of wiping. As you start healing, you may use the squirt bottle before wiping yourself. Make sure to wipe gently. °? Fill the squirt bottle with clean water every time you use the bathroom. °· You will be given sanitary pads to wear. °How can I expect to feel? °· You may not feel the need to urinate for several hours after delivery. °· You will have some soreness and pain in your abdomen and vagina. °· If you are breastfeeding, you may have uterine contractions every time you breastfeed for up to several weeks postpartum. Uterine contractions help your uterus return to its normal size. °· It is normal to have vaginal bleeding (lochia) after delivery. The amount and appearance of lochia is often similar to a menstrual period in the first week after delivery. It will gradually decrease over the next few weeks to a dry, yellow-brown discharge. For most women, lochia stops completely by 6-8 weeks after delivery. Vaginal bleeding can vary from woman to woman. °· Within the first few  days after delivery, you may have breast engorgement. This is when your breasts feel heavy, full, and uncomfortable. Your breasts may also throb and feel hard, tightly stretched, warm, and tender. After this occurs, you may have milk leaking from your breasts. Your health care provider can help you relieve discomfort due to breast engorgement. Breast engorgement should go away within a few days. °· You may feel more sad or worried than normal due to hormonal changes after delivery. These feelings should not last more than a few days. If these feelings do not go away after several days, speak with your health care provider. °How should I care for myself? °· Tell your health care provider if you have pain or discomfort. °· Drink enough water to keep your urine clear or pale yellow. °· Wash your hands thoroughly with soap and water for at least 20 seconds after changing your sanitary pads, after using the toilet, and before holding or feeding your baby. °· If you are not breastfeeding, avoid touching your breasts a lot. Doing this can make your breasts produce more milk. °· If you become weak or lightheaded, or you feel like you might faint, ask for help before: °? Getting out of bed. °? Showering. °· Change your sanitary pads frequently. Watch for any changes in your flow, such as a sudden increase in volume, a change in color, the passing of large blood clots. If you pass a blood clot from your vagina, save it   to show to your health care provider. Do not flush blood clots down the toilet without having your health care provider look at them. °· Make sure that all your vaccinations are up to date. This can help protect you and your baby from getting certain diseases. You may need to have immunizations done before you leave the hospital. °· If desired, talk with your health care provider about methods of family planning or birth control (contraception). °How can I start bonding with my baby? °Spending as much time as  possible with your baby is very important. During this time, you and your baby can get to know each other and develop a bond. Having your baby stay with you in your room (rooming in) can give you time to get to know your baby. Rooming in can also help you become comfortable caring for your baby. Breastfeeding can also help you bond with your baby. °How can I plan for returning home with my baby? °· Make sure that you have a car seat installed in your vehicle. °? Your car seat should be checked by a certified car seat installer to make sure that it is installed safely. °? Make sure that your baby fits into the car seat safely. °· Ask your health care provider any questions you have about caring for yourself or your baby. Make sure that you are able to contact your health care provider with any questions after leaving the hospital. °This information is not intended to replace advice given to you by your health care provider. Make sure you discuss any questions you have with your health care provider. °Document Released: 11/14/2006 Document Revised: 06/22/2015 Document Reviewed: 12/22/2014 °Elsevier Interactive Patient Education © 2018 [employer] ° °

## 2017-09-16 NOTE — Lactation Note (Addendum)
This note was copied from a baby's chart. Lactation Consultation Note  Patient Name: Girl Myriah Boggus SVEXO'G Date: 09/16/2017 Reason for consult: Follow-up assessment  Mom was assisted with pumping. Mom was shown how to use coconut oil to lubricate flanges. Initially, her L breast seemed to need a size 27 & her R breast a size 24. However, Mom may need a size 30 for the L breast & a size 27 for the R side. A size 30 flange was provided.   Mother's sister was shown how to wash pump parts. Mom was also shown how to use DEBP & how to disassemble, clean, & reassemble parts. Mom was shown how to assemble & use hand pump (single & double-mode) that was included in pump kit.   Mom knows to continue to offer R breast for feedings & to pump L breast (and R breast, as needed)  for comfort. "Mia" was cup-fed EBM w/ease. Mom reported that her breasts felt much better after using the DEBP.  Lactation number provided in case Mom needs further assistance this evening.  Matthias Hughs [facility] 09/16/2017, 11:09 PM

## 2017-09-21 ENCOUNTER — Inpatient Hospital Stay (HOSPITAL_COMMUNITY): Payer: Medicaid Other

## 2017-09-21 ENCOUNTER — Inpatient Hospital Stay (EMERGENCY_DEPARTMENT_HOSPITAL)
Admission: AD | Admit: 2017-09-21 | Discharge: 2017-09-22 | Disposition: A | Discharge status: Home / Self Care | Payer: Medicaid Other | Source: Ambulatory Visit | Attending: Obstetrics and Gynecology | Admitting: Obstetrics and Gynecology

## 2017-09-21 DIAGNOSIS — O035 Genital tract and pelvic infection following complete or unspecified spontaneous abortion: Secondary | ICD-10-CM | POA: Diagnosis not present

## 2017-09-21 DIAGNOSIS — Z880 Allergy status to penicillin: Secondary | ICD-10-CM | POA: Diagnosis not present

## 2017-09-21 DIAGNOSIS — F17210 Nicotine dependence, cigarettes, uncomplicated: Secondary | ICD-10-CM | POA: Diagnosis not present

## 2017-09-21 DIAGNOSIS — N939 Abnormal uterine and vaginal bleeding, unspecified: Secondary | ICD-10-CM | POA: Diagnosis not present

## 2017-09-21 LAB — CBC
HCT: 31.7 % — ABNORMAL LOW (ref 36.0–46.0)
Hemoglobin: 9.5 g/dL — ABNORMAL LOW (ref 12.0–15.0)
MCH: 23.6 pg — ABNORMAL LOW (ref 26.0–34.0)
MCHC: 30 g/dL (ref 30.0–36.0)
MCV: 78.7 fL (ref 78.0–100.0)
PLATELETS: 248 10*3/uL (ref 150–400)
RBC: 4.03 MIL/uL (ref 3.87–5.11)
RDW: 17.3 % — AB (ref 11.5–15.5)
WBC: 15.8 10*3/uL — ABNORMAL HIGH (ref 4.0–10.5)

## 2017-09-21 MED ORDER — IBUPROFEN 600 MG PO TABS
600.0000 mg | ORAL_TABLET | Freq: Once | ORAL | Status: AC
  Administered 2017-09-21: 600 mg via ORAL
  Filled 2017-09-21: qty 1

## 2017-09-21 NOTE — Discharge Instructions (Signed)
Dilation and Curettage or Vacuum Curettage, Care After  These instructions give you information about caring for yourself after your procedure. Your doctor may also give you more specific instructions. Call your doctor if you have any problems or questions after your procedure.  Follow these instructions at home:  Activity   · Do not drive or use heavy machinery while taking prescription pain medicine.  · For 24 hours after your procedure, avoid driving.  · Take short walks often, followed by rest periods. Ask your doctor what activities are safe for you. After one or two days, you may be able to return to your normal activities.  · Do not lift anything that is heavier than 10 lb (4.5 kg) until your doctor approves.  · For at least 2 weeks, or as long as told by your doctor:  ? Do not douche.  ? Do not use tampons.  ? Do not have sex.  General instructions   · Take over-the-counter and prescription medicines only as told by your doctor. This is very important if you take blood thinning medicine.  · Do not take baths, swim, or use a hot tub until your doctor approves. Take showers instead of baths.  · Wear compression stockings as told by your doctor.  · It is up to you to get the results of your procedure. Ask your doctor when your results will be ready.  · Keep all follow-up visits as told by your doctor. This is important.  Contact a doctor if:  · You have very bad cramps that get worse or do not get better with medicine.  · You have very bad pain in your belly (abdomen).  · You cannot drink fluids without throwing up (vomiting).  · You get pain in a different part of the area between your belly and thighs (pelvis).  · You have bad-smelling discharge from your vagina.  · You have a rash.  Get help right away if:  · You are bleeding a lot from your vagina. A lot of bleeding means soaking more than one sanitary pad in an hour, for 2 hours in a row.  · You have clumps of blood (blood clots) coming from your  vagina.  · You have a fever or chills.  · Your belly feels very tender or hard.  · You have chest pain.  · You have trouble breathing.  · You cough up blood.  · You feel dizzy.  · You feel light-headed.  · You pass out (faint).  · You have pain in your neck or shoulder area.  Summary  · Take short walks often, followed by rest periods. Ask your doctor what activities are safe for you. After one or two days, you may be able to return to your normal activities.  · Do not lift anything that is heavier than 10 lb (4.5 kg) until your doctor approves.  · Do not take baths, swim, or use a hot tub until your doctor approves. Take showers instead of baths.  · Contact your doctor if you have any symptoms of infection, like bad-smelling discharge from your vagina.  This information is not intended to replace advice given to you by your health care provider. Make sure you discuss any questions you have with your health care provider.  Document Released: 10/27/2007 Document Revised: 10/05/2015 Document Reviewed: 10/05/2015  Elsevier Interactive Patient Education © 2017 [employer]

## 2017-09-21 NOTE — MAU Note (Signed)
PT SAYS SHE DEL VAG ON 8-15-    BLEEDING HAS  BEEN HEAVY . THEN TONIGHT  STARTED CRAMPING - HAD SHAKES AND  FEVER - THEN SOMETHING PASSED FROM VAG.    BOTTLE FEEDING

## 2017-09-21 NOTE — MAU Provider Note (Addendum)
Chief Complaint:  Foreign object from vagina   Provider saw patient at 2207  HPI: Sherry Leach is a 22 y.o. G1P1001 who presents to maternity admissions reporting heavy vaginal bleeding since delivery on 09/14/17 with passage of tissue from vagina tonight.  Brought it in with her. . She reports vaginal bleeding, vaginal itching/burning, urinary symptoms, h/a, dizziness, n/v, or fever/chills.    Vaginal Discharge  The patient's primary symptoms include pelvic pain, vaginal bleeding and vaginal discharge. The patient's pertinent negatives include no genital itching, genital lesions or genital odor. This is a recurrent problem. The current episode started in the past 7 days. The problem occurs constantly. The pain is moderate. The problem affects both sides. She is not pregnant. Associated symptoms include abdominal pain. Pertinent negatives include no back pain, chills, constipation, diarrhea, dysuria, fever, nausea or vomiting. The vaginal discharge was bloody. The vaginal bleeding is heavier than menses. She has been passing clots. She has been passing tissue. Nothing aggravates the symptoms. She has tried nothing for the symptoms.   RN Note: PT SAYS SHE DEL VAG ON 8-15-    BLEEDING HAS  BEEN HEAVY . THEN TONIGHT  STARTED CRAMPING - HAD SHAKES AND  FEVER - THEN SOMETHING PASSED FROM VAG.    BOTTLE FEEDING  Past Medical History: Past Medical History:  Diagnosis Date  . Medical history non-contributory   . Seasonal allergies     Past obstetric history: OB History  Gravida Para Term Preterm AB Living  1 1 1     1   SAB TAB Ectopic Multiple Live Births        0 1    # Outcome Date GA Lbr Len/2nd Weight Sex Delivery Anes PTL Lv  1 Term 09/14/17 [id number] 05:31 / 00:12 3085 g F Vag-Spont EPI  LIV    Past Surgical History: Past Surgical History:  Procedure Laterality Date  . MOUTH SURGERY      Family History: Family History  Problem Relation Age of Onset  . Hypertension Mother   .  Hypertension Other     Social History: Social History   Tobacco Use  . Smoking status: Former Smoker    Packs/day: 0.50    Last attempt to quit: 01/01/2017    Years since quitting: 0.7  . Smokeless tobacco: Never Used  Substance Use Topics  . Alcohol use: No  . Drug use: No    Allergies:  Allergies  Allergen Reactions  . Amoxicillin Rash    Has patient had a PCN reaction causing immediate rash, facial/tongue/throat swelling, SOB or lightheadedness with hypotension: No Has patient had a PCN reaction causing severe rash involving mucus membranes or skin necrosis: No Has patient had a PCN reaction that required hospitalization: No Has patient had a PCN reaction occurring within the last 10 years: Yes If all of the above answers are "NO", then may proceed with Cephalosporin use.   Marland Kitchen. Penicillins Rash    Has patient had a PCN reaction causing immediate rash, facial/tongue/throat swelling, SOB or lightheadedness with hypotension: No Has patient had a PCN reaction causing severe rash involving mucus membranes or skin necrosis: No Has patient had a PCN reaction that required hospitalization: No Has patient had a PCN reaction occurring within the last 10 years: Yes If all of the above answers are "NO", then may proceed with Cephalosporin use.     Meds:  Medications Prior to Admission  Medication Sig Dispense Refill Last Dose  . acetaminophen (TYLENOL) 325 MG tablet Take  325 mg by mouth every 6 (six) hours as needed for mild pain.   09/13/2017 at Unknown time  . Calcium Carbonate Antacid (TUMS PO) Take 2 tablets by mouth daily as needed (heartburn).    Past Week at Unknown time  . ibuprofen (ADVIL,MOTRIN) 600 MG tablet Take 1 tablet (600 mg total) by mouth every 6 (six) hours as needed. 30 tablet 1   . norethindrone (MICRONOR,CAMILA,ERRIN) 0.35 MG tablet Take 1 tablet (0.35 mg total) by mouth daily. Begin 4-6 wks after delivery 3 Package 3   . Prenatal Vit-Fe Fumarate-FA (PRENATAL  MULTIVITAMIN) TABS tablet Take 1 tablet by mouth daily at 12 noon.   09/13/2017 at Unknown time    I have reviewed patient's Past Medical Hx, Surgical Hx, Family Hx, Social Hx, medications and allergies.  ROS:  Review of Systems  Constitutional: Negative for chills and fever.  Gastrointestinal: Positive for abdominal pain. Negative for constipation, diarrhea, nausea and vomiting.  Genitourinary: Positive for pelvic pain and vaginal discharge. Negative for dysuria.  Musculoskeletal: Negative for back pain.   Other systems negative   Physical Exam   Vitals:   09/21/17 2355  BP: 131/86  Pulse: 92  Resp: 19   Constitutional: Well-developed, well-nourished female in no acute distress.  Cardiovascular: normal rate and rhythm Respiratory: normal effort, no distress. GI: Abd soft, non-tender.  Nondistended.  No rebound, No guarding.   MS: Extremities nontender, no edema, normal ROM Neurologic: Alert and oriented x 4.   Grossly nonfocal. GU: Neg CVAT. Skin:  Warm and Dry Psych:  Affect appropriate.  PELVIC EXAM: Speculum exam done.  There were membranes and clot coming from cervical os.   These were removed and put into specimen container.  No profuse bleeding after that.  Light bleeding after removal  Exam of tissue brought in by patient reveals large circular amniotic membrane about 12-13cm wide. Scattered adherent red tissue with appearance of placental tissue.    Labs: Results for orders placed or performed during the hospital encounter of 09/21/17 (from the past 24 hour(s))  CBC     Status: Abnormal   Collection Time: 09/21/17 10:19 PM  Result Value Ref Range   WBC 15.8 (H) 4.0 - 10.5 K/uL   RBC 4.03 3.87 - 5.11 MIL/uL   Hemoglobin 9.5 (L) 12.0 - 15.0 g/dL   HCT 16.1 (L) 09.6 - 04.5 %   MCV 78.7 78.0 - 100.0 fL   MCH 23.6 (L) 26.0 - 34.0 pg   MCHC 30.0 30.0 - 36.0 g/dL   RDW 40.9 (H) 81.1 - 91.4 %   Platelets 248 150 - 400 K/uL   --/--/O NEG (08/15 0115)  Imaging:   US Pelvis (transabdominal Only)  Result Date: 09/21/2017 CLINICAL DATA:  Recent vaginal delivery with bleeding EXAM: TRANSABDOMINAL ULTRASOUND OF PELVIS TECHNIQUE: Transabdominal ultrasound examination of the pelvis was performed including evaluation of the uterus, ovaries, adnexal regions, and pelvic cul-de-sac. COMPARISON:  None. FINDINGS: Uterus Measurements: 14.3 x 8.8 x 12.5 cm. No fibroids or other mass visualized. Endometrium Thickness: 29.8 mm. Heterogenous echotexture with some increased vascularity. Right ovary Not well seen Left ovary Not well seen Other findings:  No abnormal free fluid. IMPRESSION: Increased endometrial thickness of 29.8 mm with slight increased vascularity. Sonographic findings would be suspicious for retained products of conception in the correct clinical setting. Electronically Signed   By: Jasmine Pang M.D.   On: 09/21/2017 23:29     MAU Course/MDM: I have ordered labs as follows: CBC and Hold  clot.  Hemoglobin is higher than that on discharge.  Imaging ordered: Pelvic ultrasound which showed retained products of conception Results reviewed.   Consult Dr Alysia Penna with presentation and exam findings.  He recommends D&C in am   Pt stable at time of discharge.  Assessment: 1. Retained products of conception after delivery with complications     Plan: Discharge home Recommend Return to hospital at 0700.  NPO past midnight.  Plan surgery at 0900 Rx sent for Ibuprofen for pain Follow-up Information    [place] Bing, MD Follow up.   Specialty:  Obstetrics and Gynecology Why:  Come to [facility] at [facility] information: [address] [place] [address] [phone]           Encouraged to return here or to other Urgent Care/ED if she develops worsening of symptoms, increase in pain, fever, or other concerning symptoms.   Wynelle Bourgeois CNM, MSN Certified Nurse-Midwife 09/21/2017 11:50 PM  OB Attending As above. Pt stable. Do not feel  D & C for retained POC's is emergent Procedure scheduled for 9 AM.  [name], MD

## 2017-09-22 ENCOUNTER — Ambulatory Visit (HOSPITAL_COMMUNITY): Payer: Medicaid Other | Admitting: Anesthesiology

## 2017-09-22 ENCOUNTER — Other Ambulatory Visit: Payer: Self-pay

## 2017-09-22 ENCOUNTER — Encounter (HOSPITAL_COMMUNITY)
Admission: AD | Disposition: A | Discharge status: Home / Self Care | Payer: Self-pay | Source: Ambulatory Visit | Attending: Obstetrics and Gynecology

## 2017-09-22 ENCOUNTER — Ambulatory Visit (HOSPITAL_COMMUNITY)
Admission: AD | Admit: 2017-09-22 | Discharge: 2017-09-22 | Disposition: A | Discharge status: Home / Self Care | Payer: Medicaid Other | Source: Ambulatory Visit | Attending: Obstetrics and Gynecology | Admitting: Obstetrics and Gynecology

## 2017-09-22 ENCOUNTER — Encounter (HOSPITAL_COMMUNITY): Payer: Self-pay

## 2017-09-22 DIAGNOSIS — Z880 Allergy status to penicillin: Secondary | ICD-10-CM | POA: Insufficient documentation

## 2017-09-22 DIAGNOSIS — F17210 Nicotine dependence, cigarettes, uncomplicated: Secondary | ICD-10-CM | POA: Insufficient documentation

## 2017-09-22 HISTORY — PX: DILATION AND EVACUATION: SHX1459

## 2017-09-22 LAB — SAMPLE TO BLOOD BANK

## 2017-09-22 SURGERY — DILATION AND EVACUATION, UTERUS
Anesthesia: General | Site: Vagina

## 2017-09-22 MED ORDER — DOXYCYCLINE HYCLATE 100 MG IV SOLR
200.0000 mg | INTRAVENOUS | Status: AC
  Administered 2017-09-22: 200 mg via INTRAVENOUS
  Filled 2017-09-22: qty 200

## 2017-09-22 MED ORDER — SCOPOLAMINE 1 MG/3DAYS TD PT72
1.0000 | MEDICATED_PATCH | Freq: Once | TRANSDERMAL | Status: DC
  Administered 2017-09-22: 1.5 mg via TRANSDERMAL

## 2017-09-22 MED ORDER — MIDAZOLAM HCL 2 MG/2ML IJ SOLN
INTRAMUSCULAR | Status: AC
  Filled 2017-09-22: qty 2

## 2017-09-22 MED ORDER — PROMETHAZINE HCL 25 MG/ML IJ SOLN: 6.2500 mg | INTRAMUSCULAR | Status: DC | PRN

## 2017-09-22 MED ORDER — DEXAMETHASONE SODIUM PHOSPHATE 4 MG/ML IJ SOLN
INTRAMUSCULAR | Status: DC | PRN
  Administered 2017-09-22: 8 mg via INTRAVENOUS

## 2017-09-22 MED ORDER — FENTANYL CITRATE (PF) 100 MCG/2ML IJ SOLN
INTRAMUSCULAR | Status: DC | PRN
  Administered 2017-09-22 (×4): 50 ug via INTRAVENOUS

## 2017-09-22 MED ORDER — FENTANYL CITRATE (PF) 100 MCG/2ML IJ SOLN
25.0000 ug | INTRAMUSCULAR | Status: DC | PRN
  Administered 2017-09-22 (×2): 50 ug via INTRAVENOUS

## 2017-09-22 MED ORDER — OXYCODONE HCL 5 MG/5ML PO SOLN: 5.0000 mg | Freq: Once | ORAL | Status: DC | PRN

## 2017-09-22 MED ORDER — KETOROLAC TROMETHAMINE 30 MG/ML IJ SOLN
INTRAMUSCULAR | Status: DC | PRN
  Administered 2017-09-22: 30 mg via INTRAVENOUS

## 2017-09-22 MED ORDER — LIDOCAINE HCL 1 % IJ SOLN
INTRAMUSCULAR | Status: AC
  Filled 2017-09-22: qty 20

## 2017-09-22 MED ORDER — ACETAMINOPHEN 160 MG/5ML PO SOLN: 325.0000 mg | ORAL | Status: DC | PRN

## 2017-09-22 MED ORDER — LIDOCAINE HCL (CARDIAC) PF 100 MG/5ML IV SOSY
PREFILLED_SYRINGE | INTRAVENOUS | Status: DC | PRN
  Administered 2017-09-22: 80 mg via INTRAVENOUS

## 2017-09-22 MED ORDER — PROPOFOL 10 MG/ML IV BOLUS
INTRAVENOUS | Status: DC | PRN
  Administered 2017-09-22: 160 mg via INTRAVENOUS
  Administered 2017-09-22: 40 mg via INTRAVENOUS

## 2017-09-22 MED ORDER — METRONIDAZOLE 500 MG PO TABS: 500.0000 mg | ORAL_TABLET | Freq: Two times a day (BID) | ORAL | 0 refills | Status: AC

## 2017-09-22 MED ORDER — MEPERIDINE HCL 25 MG/ML IJ SOLN: 6.2500 mg | INTRAMUSCULAR | Status: DC | PRN

## 2017-09-22 MED ORDER — METHYLERGONOVINE MALEATE 0.2 MG/ML IJ SOLN
INTRAMUSCULAR | Status: DC | PRN
  Administered 2017-09-22: 0.2 mg via INTRAMUSCULAR

## 2017-09-22 MED ORDER — LACTATED RINGERS IV SOLN
INTRAVENOUS | Status: DC
  Administered 2017-09-22: 125 mL/h via INTRAVENOUS
  Administered 2017-09-22: 1000 mL via INTRAVENOUS

## 2017-09-22 MED ORDER — MIDAZOLAM HCL 2 MG/2ML IJ SOLN
INTRAMUSCULAR | Status: DC | PRN
  Administered 2017-09-22 (×2): 1 mg via INTRAVENOUS

## 2017-09-22 MED ORDER — GLYCOPYRROLATE 0.2 MG/ML IJ SOLN
INTRAMUSCULAR | Status: DC | PRN
  Administered 2017-09-22: 0.1 mg via INTRAVENOUS

## 2017-09-22 MED ORDER — LACTATED RINGERS IV SOLN: INTRAVENOUS | Status: DC

## 2017-09-22 MED ORDER — LIDOCAINE HCL 1 % IJ SOLN
INTRAMUSCULAR | Status: DC | PRN
  Administered 2017-09-22: 18 mL

## 2017-09-22 MED ORDER — PROPOFOL 10 MG/ML IV BOLUS
INTRAVENOUS | Status: AC
  Filled 2017-09-22: qty 40

## 2017-09-22 MED ORDER — KETOROLAC TROMETHAMINE 30 MG/ML IJ SOLN: 30.0000 mg | Freq: Once | INTRAMUSCULAR | Status: DC | PRN

## 2017-09-22 MED ORDER — SOD CITRATE-CITRIC ACID 500-334 MG/5ML PO SOLN
30.0000 mL | ORAL | Status: AC
  Administered 2017-09-22: 30 mL via ORAL

## 2017-09-22 MED ORDER — OXYCODONE HCL 5 MG PO TABS: 5.0000 mg | ORAL_TABLET | Freq: Once | ORAL | Status: DC | PRN

## 2017-09-22 MED ORDER — ONDANSETRON HCL 4 MG/2ML IJ SOLN
INTRAMUSCULAR | Status: DC | PRN
  Administered 2017-09-22: 4 mg via INTRAVENOUS

## 2017-09-22 MED ORDER — SCOPOLAMINE 1 MG/3DAYS TD PT72
MEDICATED_PATCH | TRANSDERMAL | Status: AC
  Administered 2017-09-22: 1.5 mg via TRANSDERMAL
  Filled 2017-09-22: qty 1

## 2017-09-22 MED ORDER — KETOROLAC TROMETHAMINE 30 MG/ML IJ SOLN
INTRAMUSCULAR | Status: AC
  Filled 2017-09-22: qty 1

## 2017-09-22 MED ORDER — GLYCOPYRROLATE 0.2 MG/ML IJ SOLN
INTRAMUSCULAR | Status: AC
  Filled 2017-09-22: qty 1

## 2017-09-22 MED ORDER — ACETAMINOPHEN 325 MG PO TABS: 325.0000 mg | ORAL_TABLET | ORAL | Status: DC | PRN

## 2017-09-22 MED ORDER — METHYLERGONOVINE MALEATE 0.2 MG/ML IJ SOLN
INTRAMUSCULAR | Status: AC
  Filled 2017-09-22: qty 1

## 2017-09-22 MED ORDER — DOXYCYCLINE HYCLATE 100 MG PO CAPS: 100.0000 mg | ORAL_CAPSULE | Freq: Two times a day (BID) | ORAL | 0 refills | Status: DC

## 2017-09-22 MED ORDER — OXYCODONE-ACETAMINOPHEN 5-325 MG PO TABS: 1.0000 | ORAL_TABLET | Freq: Four times a day (QID) | ORAL | 0 refills | Status: DC | PRN

## 2017-09-22 MED ORDER — SOD CITRATE-CITRIC ACID 500-334 MG/5ML PO SOLN
ORAL | Status: AC
  Administered 2017-09-22: 30 mL via ORAL
  Filled 2017-09-22: qty 15

## 2017-09-22 MED ORDER — SUCCINYLCHOLINE CHLORIDE 200 MG/10ML IV SOSY
PREFILLED_SYRINGE | INTRAVENOUS | Status: AC
  Filled 2017-09-22: qty 10

## 2017-09-22 MED ORDER — FENTANYL CITRATE (PF) 250 MCG/5ML IJ SOLN
INTRAMUSCULAR | Status: AC
  Filled 2017-09-22: qty 5

## 2017-09-22 MED ORDER — FENTANYL CITRATE (PF) 100 MCG/2ML IJ SOLN
INTRAMUSCULAR | Status: AC
  Filled 2017-09-22: qty 2

## 2017-09-22 SURGICAL SUPPLY — 20 items
CATH ROBINSON RED A/P 16FR (CATHETERS) ×3 IMPLANT
DECANTER SPIKE VIAL GLASS SM (MISCELLANEOUS) ×3 IMPLANT
GLOVE BIOGEL PI IND STRL 7.0 (GLOVE) ×1 IMPLANT
GLOVE BIOGEL PI IND STRL 7.5 (GLOVE) ×1 IMPLANT
GLOVE BIOGEL PI INDICATOR 7.0 (GLOVE) ×2
GLOVE BIOGEL PI INDICATOR 7.5 (GLOVE) ×2
GLOVE NEODERM STER SZ 7 (GLOVE) ×3 IMPLANT
GOWN STRL REUS W/TWL LRG LVL3 (GOWN DISPOSABLE) ×3 IMPLANT
GOWN STRL REUS W/TWL XL LVL3 (GOWN DISPOSABLE) ×3 IMPLANT
KIT BERKELEY 1ST TRIMESTER 3/8 (MISCELLANEOUS) ×3 IMPLANT
NS IRRIG 1000ML POUR BTL (IV SOLUTION) ×3 IMPLANT
PACK VAGINAL MINOR WOMEN LF (CUSTOM PROCEDURE TRAY) ×3 IMPLANT
PAD OB MATERNITY 4.3X12.25 (PERSONAL CARE ITEMS) ×3 IMPLANT
PAD PREP 24X48 CUFFED NSTRL (MISCELLANEOUS) ×3 IMPLANT
SET BERKELEY SUCTION TUBING (SUCTIONS) ×3 IMPLANT
TOWEL OR 17X24 6PK STRL BLUE (TOWEL DISPOSABLE) ×6 IMPLANT
VACURETTE 10 RIGID CVD (CANNULA) IMPLANT
VACURETTE 7MM CVD STRL WRAP (CANNULA) IMPLANT
VACURETTE 8 RIGID CVD (CANNULA) IMPLANT
VACURETTE 9 RIGID CVD (CANNULA) ×3 IMPLANT

## 2017-09-22 NOTE — Anesthesia Procedure Notes (Signed)
Procedure Name: LMA Insertion Date/Time: 09/22/2017 9:13 AM Performed by: Earmon PhoenixWilkerson, Moo Gravley P, CRNA Pre-anesthesia Checklist: Patient identified, Emergency Drugs available, Suction available, Patient being monitored and Timeout performed Patient Re-evaluated:Patient Re-evaluated prior to induction Oxygen Delivery Method: Circle system utilized Preoxygenation: Pre-oxygenation with 100% oxygen Induction Type: IV induction Ventilation: Mask ventilation without difficulty LMA: LMA inserted LMA Size: 4.0 Tube size: 4.0 mm Number of attempts: 1 Placement Confirmation: positive ETCO2,  CO2 detector and breath sounds checked- equal and bilateral Tube secured with: Tape Dental Injury: Teeth and Oropharynx as per pre-operative assessment

## 2017-09-22 NOTE — Transfer of Care (Signed)
Immediate Anesthesia Transfer of Care Note  Patient: Sherry Leach  Procedure(s) Performed: DILATATION AND EVACUATION (N/A Vagina )  Patient Location: PACU  Anesthesia Type:General  Level of Consciousness: awake and patient cooperative  Airway & Oxygen Therapy: Patient Spontanous Breathing and Patient connected to nasal cannula oxygen  Post-op Assessment: Report given to RN and Post -op Vital signs reviewed and stable  Post vital signs: Reviewed and stable  Last Vitals:  Vitals Value Taken Time  BP 125/93 09/22/2017  9:52 AM  Temp    Pulse 82 09/22/2017  9:54 AM  Resp 15 09/22/2017  9:54 AM  SpO2 100 % 09/22/2017  9:54 AM  Vitals shown include unvalidated device data.  Last Pain:  Vitals:   09/22/17 0822  TempSrc: Oral  PainSc: 5       Patients Stated Pain Goal: 3 (09/22/17 16100822)  Complications: No apparent anesthesia complications

## 2017-09-22 NOTE — Anesthesia Preprocedure Evaluation (Signed)
Anesthesia Evaluation  Patient identified by MRN, date of birth, ID band Patient awake    Reviewed: Allergy & Precautions, H&P , NPO status   Airway Mallampati: I       Dental no notable dental hx. (+) Teeth Intact   Pulmonary Current Smoker,    Pulmonary exam normal breath sounds clear to auscultation       Cardiovascular negative cardio ROS Normal cardiovascular exam Rhythm:Regular Rate:Normal     Neuro/Psych negative neurological ROS  negative psych ROS   GI/Hepatic negative GI ROS, Neg liver ROS,   Endo/Other  negative endocrine ROS  Renal/GU negative Renal ROS  negative genitourinary   Musculoskeletal negative musculoskeletal ROS (+)   Abdominal Normal abdominal exam  (+)   Peds  Hematology   Anesthesia Other Findings   Reproductive/Obstetrics                             Anesthesia Physical Anesthesia Plan  ASA: II  Anesthesia Plan: General   Post-op Pain Management:    Induction: Intravenous  PONV Risk Score and Plan: 3 and Ondansetron, Dexamethasone and Scopolamine patch - Pre-op  Airway Management Planned: LMA  Additional Equipment:   Intra-op Plan:   Post-operative Plan: Extubation in OR  Informed Consent: I have reviewed the patients History and Physical, chart, labs and discussed the procedure including the risks, benefits and alternatives for the proposed anesthesia with the patient or authorized representative who has indicated his/her understanding and acceptance.     Plan Discussed with: CRNA and Surgeon  Anesthesia Plan Comments:         Anesthesia Quick Evaluation

## 2017-09-22 NOTE — Op Note (Signed)
Operative Note   09/22/2017  PRE-OP DIAGNOSIS: Likely retained products of conception. Postpartum day 8. Vaginal bleeding. Abdominal discomfort   POST-OP DIAGNOSIS: retained products of conception  SURGEON: Surgeon(s) and Role:    * Taneeka Curtner, Billey Goslingharlie, MD - Primary  ASSISTANT: none  PROCEDURE:  Suction dilation and curettage  ANESTHESIA: General and paracervial block  ESTIMATED BLOOD LOSS: 150mL  DRAINS: per anesthesia note  TOTAL IV FLUIDS: per anesthesia note  SPECIMENS: products of conception to pathology  VTE PROPHYLAXIS: SCDs to the bilateral lower extremities  ANTIBIOTICS: Doxycycline 100mg  IV x 1 pre op  COMPLICATIONS: none  DISPOSITION: PACU - hemodynamically stable.  CONDITION: stable  BLOOD TYPE: O NEG. Rhogam given: No, infant was Rh negative  FINDINGS: Exam under anesthesia revealed 12-14 week sized uterus with no masses and bilateral adnexa without masses or fullness. Large amount of necrotic appearing products of conception were seen, with gritty texture in all four quadrants at the end of the procedure  PROCEDURE IN DETAIL:  After informed consent was obtained, the patient was taken to the operating room where anesthesia was obtained without difficulty. The patient was positioned in the dorsal lithotomy position in [place] stirrups. The patient was examined under anesthesia, with the above noted findings.  The bi-valved speculum was placed inside the patient's vagina, and the the anterior lip of the cervix was seen and grasped with the tenaculum.  A paracervical block was achieved with 20mL of 1% lidocaine and then the cervix was able to already pass a #10 cannula. The suction was then calibrated to 60mmHg and connected to the number 10 cannula, which was then introduced with the above noted findings. A gentle curettage was done at the end and yield no products of conception.   The suction was then done one more time to remove any remaining curettage material.   Excellent hemostasis was noted, and all instruments were removed, with excellent hemostasis noted throughout.  She was then taken out of dorsal lithotomy. The patient tolerated the procedure well.  Sponge, lap and instrument counts were correct x2.  The patient was taken to recovery room in excellent condition.  Cornelia Copaharlie Audrena Talaga, Jr MD Attending [facility] Mid[facility](Faculty Practice)

## 2017-09-22 NOTE — Discharge Instructions (Signed)

## 2017-09-22 NOTE — H&P (Signed)
Obstetrics & Gynecology Surgical H&P   Date of Surgery: 09/22/2017   Primary OBGYN: [facility] Provider: Patient, No Pcp Per  Reason for Admission: scheduled surgery for likely retained POCs  History of Present Illness: Sherry Leach is a 22 y.o. G1P1001 (Patient's last menstrual period was 12/01/2016.), with the above CC. PMHx is significant for recent SVD (normal placental delivery) on 8/15. Pt came to MAU last night with persistent VB, smell and passage of tissue and u/s diagnosed with likely rPOCs and pt set up for surgery this morning. Still having VB, some chills.     ROS: A 12-point review of systems was performed and negative, except as stated in the above HPI.  OBGYN History: As per HPI. OB History  Gravida Para Term Preterm AB Living  1 1 1     1   SAB TAB Ectopic Multiple Live Births        0 1    # Outcome Date GA Lbr Len/2nd Weight Sex Delivery Anes PTL Lv  1 Term 09/14/17 [redacted]w[redacted]d 05:31 / 00:12 3085 g F Vag-Spont EPI  LIV   Past Medical History: Past Medical History:  Diagnosis Date  . Medical history non-contributory   . Seasonal allergies     Past Surgical History: Past Surgical History:  Procedure Laterality Date  . MOUTH SURGERY      Family History:  Family History  Problem Relation Age of Onset  . Hypertension Mother   . Hypertension Other     Social History:  Social History   Socioeconomic History  . Marital status: Single    Spouse name: Not on file  . Number of children: Not on file  . Years of education: Not on file  . Highest education level: Not on file  Occupational History  . Not on file  Social Needs  . Financial resource strain: Not on file  . Food insecurity:    Worry: Not on file    Inability: Not on file  . Transportation needs:    Medical: Not on file    Non-medical: Not on file  Tobacco Use  . Smoking status: Current Some Day Smoker    Packs/day: 0.50  . Smokeless tobacco: Never Used   Substance and Sexual Activity  . Alcohol use: No  . Drug use: No  . Sexual activity: Yes    Birth control/protection: None  Lifestyle  . Physical activity:    Days per week: Not on file    Minutes per session: Not on file  . Stress: Not on file  Relationships  . Social connections:    Talks on phone: Not on file    Gets together: Not on file    Attends religious service: Not on file    Active member of club or organization: Not on file    Attends meetings of clubs or organizations: Not on file    Relationship status: Not on file  . Intimate partner violence:    Fear of current or ex partner: Not on file    Emotionally abused: Not on file    Physically abused: Not on file    Forced sexual activity: Not on file  Other Topics Concern  . Not on file  Social History Narrative  . Not on file    Allergy: Allergies  Allergen Reactions  . Amoxicillin Rash    Has patient had a PCN reaction causing immediate rash, facial/tongue/throat swelling, SOB or lightheadedness with hypotension: No Has patient had  a PCN reaction causing severe rash involving mucus membranes or skin necrosis: No Has patient had a PCN reaction that required hospitalization: No Has patient had a PCN reaction occurring within the last 10 years: Yes If all of the above answers are "NO", then may proceed with Cephalosporin use.   Marland Kitchen. Penicillins Rash    Has patient had a PCN reaction causing immediate rash, facial/tongue/throat swelling, SOB or lightheadedness with hypotension: No Has patient had a PCN reaction causing severe rash involving mucus membranes or skin necrosis: No Has patient had a PCN reaction that required hospitalization: No Has patient had a PCN reaction occurring within the last 10 years: Yes If all of the above answers are "NO", then may proceed with Cephalosporin use.     Current Outpatient Medications: Medications Prior to Admission  Medication Sig Dispense Refill Last Dose  .  acetaminophen (TYLENOL) 325 MG tablet Take 325 mg by mouth every 6 (six) hours as needed for mild pain.   09/21/2017 at Unknown time  . Calcium Carbonate Antacid (TUMS PO) Take 2 tablets by mouth daily as needed (heartburn).    Past Month at Unknown time  . ibuprofen (ADVIL,MOTRIN) 600 MG tablet Take 1 tablet (600 mg total) by mouth every 6 (six) hours as needed. 30 tablet 1 09/21/2017 at Unknown time  . Prenatal Vit-Fe Fumarate-FA (PRENATAL MULTIVITAMIN) TABS tablet Take 1 tablet by mouth daily at 12 noon.   Past Week at Unknown time  . norethindrone (MICRONOR,CAMILA,ERRIN) 0.35 MG tablet Take 1 tablet (0.35 mg total) by mouth daily. Begin 4-6 wks after delivery 3 Package 3 Unknown at Unknown time     Hospital Medications: Current Facility-Administered Medications  Medication Dose Route Frequency Provider Last Rate Last Dose  . doxycycline (VIBRAMYCIN) 200 mg in dextrose 5 % 250 mL IVPB  200 mg Intravenous On Call to OR Hermina StaggersErvin, Michael L, MD      . lactated ringers infusion   Intravenous Continuous Leilani AbleHatchett, Franklin, MD 125 mL/hr at 09/22/17 [id number] 125 mL/hr at 09/22/17 [id number]  . scopolamine (TRANSDERM-SCOP) 1 MG/3DAYS 1.5 mg  1 patch Transdermal Once Leilani AbleHatchett, Franklin, MD   1.5 mg at 09/22/17 [id number]     Physical Exam:   Current Vital Signs 24h Vital Sign Ranges  T 98.1 F (36.7 C) Temp  Avg: 98.1 F (36.7 C)  Min: 98.1 F (36.7 C)  Max: 98.1 F (36.7 C)  BP (!) 135/92 BP  Min: 131/86  Max: 135/92  HR (!) 102 Pulse  Avg: 97  Min: 92  Max: 102  RR 18 Resp  Avg: 18.5  Min: 18  Max: 19  SaO2 100 % Room Air SpO2  Avg: 100 %  Min: 100 %  Max: 100 %       24 Hour I/O Current Shift I/O  Time Ins Outs No intake/output data recorded. No intake/output data recorded.   Patient Vitals for the past 24 hrs:  BP Temp Temp src Pulse Resp SpO2 Height Weight  09/22/17 0822 (!) 135/92 98.1 F (36.7 C) Oral (!) 102 18 100 % 5' 8.5" (1.74 m) 84.4 kg    Body mass index is 27.87 kg/m. General  appearance: Well nourished, well developed female in no acute distress.  Neck:  Supple, normal appearance, and no thyromegaly  Cardiovascular: S1, S2 normal, no murmur, rub or gallop, regular rate and rhythm Respiratory:  Clear to auscultation bilateral. Normal respiratory effort Abdomen: positive bowel sounds and no masses, hernias; diffusely non tender to palpation, non  distended Neuro/Psych:  Normal mood and affect.  Skin:  Warm and dry.  Extremities: no clubbing, cyanosis, or edema.   Pelvic exam by Dr. Alysia Penna last night:  PELVIC EXAM: Speculum exam done.  There were membranes and clot coming from cervical os.   These were removed and put into specimen container.  No profuse bleeding after that.  Light bleeding after removal  Exam of tissue brought in by patient reveals large circular amniotic membrane about 12-13cm wide. Scattered adherent red tissue with appearance of placental tissue.  Laboratory: Recent Labs  Lab 09/21/17 2219  WBC 15.8*  HGB 9.5*  HCT 31.7*  PLT 248   O NEG  Imaging:  CLINICAL DATA:  Recent vaginal delivery with bleeding  EXAM: TRANSABDOMINAL ULTRASOUND OF PELVIS  TECHNIQUE: Transabdominal ultrasound examination of the pelvis was performed including evaluation of the uterus, ovaries, adnexal regions, and pelvic cul-de-sac.  COMPARISON:  None.  FINDINGS: Uterus  Measurements: 14.3 x 8.8 x 12.5 cm. No fibroids or other mass visualized.  Endometrium  Thickness: 29.8 mm. Heterogenous echotexture with some increased vascularity.  Right ovary  Not well seen  Left ovary  Not well seen  Other findings:  No abnormal free fluid.  IMPRESSION: Increased endometrial thickness of 29.8 mm with slight increased vascularity. Sonographic findings would be suspicious for retained products of conception in the correct clinical setting.   Electronically Signed   By: Jasmine Pang M.D.   On: 09/21/2017 23:29  Assessment: Sherry Leach  is a 22 y.o. G1P1001 (Patient's last menstrual period was 12/01/2016.) with persistent VB and possible rPOCs; pt stable  Plan: Pt amenable to suction d&c. D/w her not great data but recommend home with PO abx after surgery. Not breastfeeding so will do doxy and flagyl. Pelvic rest instructions given. Infant was Rh negative, too. Can proceed when OR is ready  Cornelia Copa MD Attending [facility])

## 2017-09-22 NOTE — Anesthesia Postprocedure Evaluation (Signed)
Anesthesia Post Note  Patient: Sherry Leach C Amini  Procedure(s) Performed: DILATATION AND EVACUATION (N/A Vagina )     Patient location during evaluation: PACU Anesthesia Type: General Level of consciousness: awake Pain management: pain level controlled Vital Signs Assessment: post-procedure vital signs reviewed and stable Respiratory status: spontaneous breathing Cardiovascular status: stable Postop Assessment: no apparent nausea or vomiting Anesthetic complications: no    Last Vitals:  Vitals:   09/22/17 1015 09/22/17 1030  BP: 134/85 131/87  Pulse: 78 71  Resp: 15 15  Temp:    SpO2: 100% 100%    Last Pain:  Vitals:   09/22/17 1030  TempSrc:   PainSc: 4    Pain Goal: Patients Stated Pain Goal: 3 (09/22/17 1030)               Michela Herst JR,JOHN Tuan Tippin

## 2017-09-23 ENCOUNTER — Encounter (HOSPITAL_COMMUNITY): Payer: Self-pay | Admitting: Obstetrics and Gynecology

## 2017-10-23 ENCOUNTER — Ambulatory Visit (INDEPENDENT_AMBULATORY_CARE_PROVIDER_SITE_OTHER): Payer: Medicaid Other | Admitting: Student

## 2017-10-23 ENCOUNTER — Other Ambulatory Visit (HOSPITAL_COMMUNITY)
Admission: RE | Admit: 2017-10-23 | Discharge: 2017-10-23 | Disposition: A | Discharge status: Home / Self Care | Payer: Medicaid Other | Source: Ambulatory Visit | Attending: Student | Admitting: Student

## 2017-10-23 VITALS — BP 138/76 | HR 95 | Ht 68.5 in | Wt 184.0 lb

## 2017-10-23 DIAGNOSIS — Z3400 Encounter for supervision of normal first pregnancy, unspecified trimester: Secondary | ICD-10-CM | POA: Insufficient documentation

## 2017-10-23 DIAGNOSIS — Z1389 Encounter for screening for other disorder: Secondary | ICD-10-CM | POA: Diagnosis not present

## 2017-10-23 LAB — POCT PREGNANCY, URINE: Preg Test, Ur: NEGATIVE

## 2017-10-23 NOTE — Progress Notes (Signed)
Subjective:     Sherry Leach is a 22 y.o. female who presents for a postpartum visit. She is 6 weeks postpartum following a spontaneous vaginal delivery. I have fully reviewed the prenatal and intrapartum course. The delivery was at 41 gestational weeks. Outcome: spontaneous vaginal delivery. Anesthesia: epidural. Postpartum course has been unremarkable. Baby's course has been unremarkable. Baby is feeding by bottle - Enfamil with Iron. Bleeding resolved but now on her period. . Bowel function is normal. Bladder function is normal. Patient is sexually active. Contraception method is oral progesterone-only contraceptive. Postpartum depression screening: negative.  The following portions of the patient's history were reviewed and updated as appropriate: allergies, current medications, past family history, past medical history, past social history, past surgical history and problem list.  Review of Systems Pertinent items are noted in HPI.   Objective:    BP 138/76   Pulse 95   Ht 5' 8.5" (1.74 m)   Wt 184 lb (83.5 kg)   LMP 12/01/2016   BMI 27.57 kg/m   General:  alert, cooperative and no distress   Breasts:  inspection negative, no nipple discharge or bleeding, no masses or nodularity palpable  Lungs: clear to auscultation bilaterally  Heart:  regular rate and rhythm, S1, S2 normal, no murmur, click, rub or gallop  Abdomen: soft, non-tender; bowel sounds normal; no masses,  no organomegaly   Vulva:  normal. Well healed.   Vagina: normal vaginal walls; blood in vaginal vault consistent with menses.   Cervix:  no lesion  Corpus: normal  Adnexa:  normal adnexa  Rectal Exam: Not performed.        Assessment:    Healthy postpartum exam. Pap smear done at today's visit.  Included STI testing for gonorrhea and chlamydia; patient does not want HIV and RPR testing today.   Plan:    1. Contraception: oral progesterone-only contraceptive 2. Discussed risk of getting pregnant using POP;  patient still elects to proceed. She does not want a LARC.  3. Follow up in: 1 year or as needed.

## 2017-10-23 NOTE — Patient Instructions (Signed)
Hormonal Contraception Information °Hormonal contraception is a type of birth control that uses hormones to prevent pregnancy. It usually involves a combination of the hormones estrogen and progesterone or only the hormone progesterone. Hormonal contraception works in these ways: °· It thickens the mucus in the cervix, making it harder for sperm to enter the uterus. °· It changes the lining of the uterus, making it harder for an egg to implant. °· It may stop the ovaries from releasing eggs (ovulation). Some women who take hormonal contraceptives that contain only progesterone may continue to ovulate. ° °Hormonal contraception cannot prevent sexually transmitted infections (STIs). Pregnancy may still occur. °Estrogen and progesterone contraceptives °Contraceptives that use a combination of estrogen and progesterone are available in these forms: °· Pill. Pills come in different combinations of hormones. They must be taken at the same time each day. Pills can affect your period, causing you to get your period once every three months or not at all. °· Patch. The patch must be worn on the lower abdomen for three weeks and then removed on the fourth. °· Vaginal ring. The ring is placed in the vagina and left there for three weeks. It is then removed for one week. ° °Progesterone contraceptives °Contraceptives that use progesterone only are available in these forms: °· Pill. Pills should be taken every day of the cycle. °· Intrauterine device (IUD). This device is inserted into the uterus and removed or replaced every five years or sooner. °· Implant. Plastic rods are placed under the skin of the upper arm. They are removed or replaced every three years or sooner. °· Injection. The injection is given once every 90 days. ° °What are the side effects? °The side effects of estrogen and progesterone contraceptives include: °· Nausea. °· Headaches. °· Breast tenderness. °· Bleeding or spotting between menstrual cycles. °· High  blood pressure (rare). °· Strokes, heart attacks, or blood clots (rare) ° °Side effects of progesterone-only contraceptives include: °· Nausea. °· Headaches. °· Breast tenderness. °· Unpredictable menstrual bleeding. °· High blood pressure (rare). ° °Talk to your health care provider about what side effects may affect you. °Where to find more information: °· Ask your health care provider for more information and resources about hormonal contraception. °· U.S. Department of Health and Human Services Office on Women's Health: www.womenshealth.gov °Questions to ask: °· What type of hormonal contraception is right for me? °· How long should I plan to use hormonal contraception? °· What are the side effects of the hormonal contraception method I choose? °· How can I prevent STIs while using hormonal contraception? °Contact a health care provider if: °· You start taking hormonal contraceptives and you develop persistent or severe side effects. °Summary °· Estrogen and progesterone are hormones used in many forms of birth control. °· Talk to your health care provider about what side effects may affect you. °· Hormonal contraception cannot prevent sexually transmitted infections (STIs). °· Ask your health care provider for more information and resources about hormonal contraception. °This information is not intended to replace advice given to you by your health care provider. Make sure you discuss any questions you have with your health care provider. °Document Released: 02/06/2007 Document Revised: 12/18/2015 Document Reviewed: 12/18/2015 °Elsevier Interactive Patient Education © 2018 [employer] Inc. ° °

## 2017-10-24 LAB — CYTOLOGY - PAP
CHLAMYDIA, DNA PROBE: NEGATIVE
DIAGNOSIS: NEGATIVE
NEISSERIA GONORRHEA: NEGATIVE

## 2017-11-27 ENCOUNTER — Ambulatory Visit (INDEPENDENT_AMBULATORY_CARE_PROVIDER_SITE_OTHER): Payer: Medicaid Other | Admitting: Internal Medicine

## 2017-11-27 ENCOUNTER — Other Ambulatory Visit (HOSPITAL_COMMUNITY)
Admission: RE | Admit: 2017-11-27 | Discharge: 2017-11-27 | Disposition: A | Discharge status: Home / Self Care | Payer: Medicaid Other | Source: Ambulatory Visit | Attending: Internal Medicine | Admitting: Internal Medicine

## 2017-11-27 ENCOUNTER — Encounter: Payer: Self-pay | Admitting: Internal Medicine

## 2017-11-27 VITALS — BP 135/81 | HR 80 | Ht 68.5 in | Wt 176.2 lb

## 2017-11-27 DIAGNOSIS — N939 Abnormal uterine and vaginal bleeding, unspecified: Secondary | ICD-10-CM | POA: Insufficient documentation

## 2017-11-27 DIAGNOSIS — Z30011 Encounter for initial prescription of contraceptive pills: Secondary | ICD-10-CM

## 2017-11-27 DIAGNOSIS — Z202 Contact with and (suspected) exposure to infections with a predominantly sexual mode of transmission: Secondary | ICD-10-CM | POA: Diagnosis not present

## 2017-11-27 MED ORDER — NORGESTIMATE-ETH ESTRADIOL 0.25-35 MG-MCG PO TABS: 1.0000 | ORAL_TABLET | Freq: Every day | ORAL | 11 refills | Status: DC

## 2017-11-27 NOTE — Progress Notes (Signed)
Pt states she has been having vaginal bleeding for 3 months - since baby was born on 09/14/17.  She desires a different birth control. Pt states she is still producing a lot of breast milk even though she is not breastfeeding.

## 2017-11-27 NOTE — Progress Notes (Signed)
   Subjective:    Sherry Leach - 22 y.o. female MRN [id number]  Date of birth: 1995-05-19  HPI  Sherry Leach is a 22 y.o. G21P1001 female here for persistent vaginal bleeding. Recent SVD about 9 weeks ago on 09/14/17. PP period complicated by retained products of conception s/p D&C on 8/23. Patient reports that she has had continued vaginal bleeding since delivery. Typically bleeds for 7 days and then stops for 2 days. Having to change pads every hour throughout the day. She is taking Minipill for contraception. Takes it every day at noon, reports she is consistent with taking it at same time and does not miss any pills. Is no longer breast feeding. Requests to be screened for STDs.     OB History    Gravida  1   Para  1   Term  1   Preterm      AB      Living  1     SAB      TAB      Ectopic      Multiple  0   Live Births  1            -  reports that she has been smoking. She has been smoking about 0.25 packs per day. She has never used smokeless tobacco. - Review of Systems: Per HPI. - Past Medical History: Patient Active Problem List   Diagnosis Date Noted  . Retained products of conception after delivery with complications 09/22/2017  . Post-dates pregnancy 09/14/2017  . Vaginal delivery 09/14/2017  . Rh negative state in antepartum period 09/04/2017   - Medications: reviewed and updated   Objective:   Physical Exam BP 135/81   Pulse 80   Ht 5' 8.5" (1.74 m)   Wt 176 lb 3.2 oz (79.9 kg)   LMP 12/01/2016   BMI 26.40 kg/m  Gen: NAD, alert, cooperative with exam, well-appearing GU/GYN: Exam performed in the presence of a chaperone. External genitalia within normal limits.  Vaginal mucosa pink, moist, normal rugae.  Nonfriable cervix without lesions, no discharge noted on speculum exam. Moderate amount of bleeding in vaginal vault from cervical os. Bimanual exam revealed normal, nongravid uterus.  No cervical motion tenderness. No adnexal masses bilaterally.            Assessment & Plan:   1. Vaginal bleeding Upreg negative. Will check CBC given reported long history of heavy bleeding. Due to history of retained POC will also repeat pelvic ultrasound. Bleeding may be related to Minipill. Will try changing form of contraception.  - POCT urine pregnancy - CBC - US PELVIC COMPLETE WITH TRANSVAGINAL; Future - Cervicovaginal ancillary only  2. Encounter for initial prescription of contraceptive pills Patient without history of VTE. Smokes less than 1/2 ppd. Counseled on risk of increased risk of VTE with smoking and estrogen use. Patient wishes to switch to estrogen containing pill.  - norgestimate-ethinyl estradiol (ORTHO-CYCLEN,SPRINTEC,PREVIFEM) 0.25-35 MG-MCG tablet; Take 1 tablet by mouth daily.  Dispense: 1 Package; Refill: 11  3. Possible exposure to STD - Cervicovaginal ancillary only   Routine preventative health maintenance measures emphasized. Please refer to After Visit Summary for other counseling recommendations.   No follow-ups on file.  Marcy Siren, D.O. OB Fellow  11/27/2017, 4:46 PM

## 2017-11-27 NOTE — Patient Instructions (Signed)
Your bleeding is most likely related to the mini pill. I have switched you to a regular birth control pill that will hopefully control your bleeding. We will check blood counts and screen for STDs. I have also ordered an ultrasound due to your history of needing a D&C for retained placenta.

## 2017-11-28 ENCOUNTER — Encounter: Payer: Self-pay | Admitting: *Deleted

## 2017-11-28 LAB — CBC
Hematocrit: 33 % — ABNORMAL LOW (ref 34.0–46.6)
Hemoglobin: 10.4 g/dL — ABNORMAL LOW (ref 11.1–15.9)
MCH: 23.3 pg — AB (ref 26.6–33.0)
MCHC: 31.5 g/dL (ref 31.5–35.7)
MCV: 74 fL — ABNORMAL LOW (ref 79–97)
PLATELETS: 183 10*3/uL (ref 150–450)
RBC: 4.46 x10E6/uL (ref 3.77–5.28)
RDW: 15.6 % — AB (ref 12.3–15.4)
WBC: 8.5 10*3/uL (ref 3.4–10.8)

## 2017-11-29 LAB — CERVICOVAGINAL ANCILLARY ONLY
BACTERIAL VAGINITIS: POSITIVE — AB
Candida vaginitis: NEGATIVE
Chlamydia: NEGATIVE
NEISSERIA GONORRHEA: NEGATIVE
Trichomonas: NEGATIVE

## 2017-12-02 ENCOUNTER — Other Ambulatory Visit: Payer: Self-pay | Admitting: Internal Medicine

## 2017-12-02 MED ORDER — FERROUS SULFATE 325 (65 FE) MG PO TBEC: 325.0000 mg | DELAYED_RELEASE_TABLET | Freq: Two times a day (BID) | ORAL | 3 refills | Status: DC

## 2017-12-02 MED ORDER — METRONIDAZOLE 500 MG PO TABS: 500.0000 mg | ORAL_TABLET | Freq: Two times a day (BID) | ORAL | 0 refills | Status: AC

## 2017-12-02 NOTE — Progress Notes (Signed)
Rx for Fe and Flagyl sent.   Marcy Siren, D.O. [facility] Family Medicine Fellow, [facility] for [facility], [facility] 12/02/2017, 4:56 PM

## 2017-12-04 ENCOUNTER — Ambulatory Visit (HOSPITAL_COMMUNITY)
Admission: RE | Admit: 2017-12-04 | Discharge: 2017-12-04 | Disposition: A | Discharge status: Home / Self Care | Payer: Medicaid Other | Source: Ambulatory Visit | Attending: Internal Medicine | Admitting: Internal Medicine

## 2017-12-04 DIAGNOSIS — R9389 Abnormal findings on diagnostic imaging of other specified body structures: Secondary | ICD-10-CM | POA: Diagnosis not present

## 2017-12-04 DIAGNOSIS — N939 Abnormal uterine and vaginal bleeding, unspecified: Secondary | ICD-10-CM

## 2017-12-08 ENCOUNTER — Other Ambulatory Visit: Payer: Self-pay | Admitting: Internal Medicine

## 2017-12-08 DIAGNOSIS — N939 Abnormal uterine and vaginal bleeding, unspecified: Secondary | ICD-10-CM

## 2017-12-08 NOTE — Progress Notes (Signed)
Please call patient to have her come in for lab visit for b-HCG. I would recommend that she have a follow up visit scheduled 1 week from now for follow up.   The b-hcg will help to determine if there are still retained POCs.   Marcy Siren, D.O. [facility] Family Medicine Fellow, [facility] for [facility], [facility] 12/08/2017, 10:23 AM

## 2017-12-11 ENCOUNTER — Other Ambulatory Visit: Payer: Medicaid Other

## 2017-12-11 ENCOUNTER — Telehealth: Payer: Self-pay | Admitting: *Deleted

## 2017-12-11 DIAGNOSIS — N939 Abnormal uterine and vaginal bleeding, unspecified: Secondary | ICD-10-CM | POA: Diagnosis not present

## 2017-12-11 NOTE — Progress Notes (Signed)
I called Sherry Leach and notifiied her of last [place] results and MD order for bhcg today and follow up in one week with provider. I also advised her if she has severe pain or heavy bleeding to go to mau. She voices understanding. She agreed to 4:15 bhcg today.

## 2017-12-11 NOTE — Telephone Encounter (Signed)
Received a voice message from 12/08/17 am stating she has reviewed her test results on MyChart app and is trying to get in touch with Dr.Wallace to explain results she doesn't understand.States is urgent and requests a call.

## 2017-12-11 NOTE — Telephone Encounter (Signed)
Please see other telephone call dated 12/08/17 where I called and spoke with patient.

## 2017-12-12 LAB — BETA HCG QUANT (REF LAB): hCG Quant: <1 m[IU]/mL

## 2017-12-13 ENCOUNTER — Other Ambulatory Visit: Payer: Self-pay

## 2017-12-13 ENCOUNTER — Inpatient Hospital Stay (HOSPITAL_COMMUNITY)
Admission: AD | Admit: 2017-12-13 | Discharge: 2017-12-13 | Disposition: A | Discharge status: Home / Self Care | Payer: Medicaid Other | Source: Ambulatory Visit | Attending: Obstetrics & Gynecology | Admitting: Obstetrics & Gynecology

## 2017-12-13 ENCOUNTER — Encounter (HOSPITAL_COMMUNITY): Payer: Self-pay | Admitting: *Deleted

## 2017-12-13 DIAGNOSIS — R109 Unspecified abdominal pain: Secondary | ICD-10-CM | POA: Insufficient documentation

## 2017-12-13 DIAGNOSIS — Z880 Allergy status to penicillin: Secondary | ICD-10-CM | POA: Diagnosis not present

## 2017-12-13 DIAGNOSIS — N939 Abnormal uterine and vaginal bleeding, unspecified: Secondary | ICD-10-CM | POA: Diagnosis not present

## 2017-12-13 DIAGNOSIS — F17210 Nicotine dependence, cigarettes, uncomplicated: Secondary | ICD-10-CM | POA: Diagnosis not present

## 2017-12-13 DIAGNOSIS — N946 Dysmenorrhea, unspecified: Secondary | ICD-10-CM | POA: Diagnosis not present

## 2017-12-13 DIAGNOSIS — R102 Pelvic and perineal pain: Secondary | ICD-10-CM | POA: Diagnosis not present

## 2017-12-13 LAB — CBC WITH DIFFERENTIAL/PLATELET
BASOS ABS: 0 10*3/uL (ref 0.0–0.1)
Basophils Relative: 0 %
EOS ABS: 0.2 10*3/uL (ref 0.0–0.5)
EOS PCT: 4 %
HCT: 34 % — ABNORMAL LOW (ref 36.0–46.0)
Hemoglobin: 10.3 g/dL — ABNORMAL LOW (ref 12.0–15.0)
LYMPHS ABS: 2.2 10*3/uL (ref 0.7–4.0)
LYMPHS PCT: 41 %
MCH: 23.5 pg — AB (ref 26.0–34.0)
MCHC: 30.3 g/dL (ref 30.0–36.0)
MCV: 77.4 fL — AB (ref 80.0–100.0)
MONO ABS: 0.2 10*3/uL (ref 0.1–1.0)
MONOS PCT: 4 %
NRBC: 0 % (ref 0.0–0.2)
Neutro Abs: 2.8 10*3/uL (ref 1.7–7.7)
Neutrophils Relative %: 51 %
PLATELETS: 163 10*3/uL (ref 150–400)
RBC: 4.39 MIL/uL (ref 3.87–5.11)
RDW: 16.4 % — ABNORMAL HIGH (ref 11.5–15.5)
WBC: 5.3 10*3/uL (ref 4.0–10.5)

## 2017-12-13 LAB — URINALYSIS, ROUTINE W REFLEX MICROSCOPIC
BILIRUBIN URINE: NEGATIVE
Glucose, UA: NEGATIVE mg/dL
HGB URINE DIPSTICK: NEGATIVE
Ketones, ur: NEGATIVE mg/dL
Leukocytes, UA: NEGATIVE
Nitrite: NEGATIVE
PROTEIN: NEGATIVE mg/dL
Specific Gravity, Urine: 1.013 (ref 1.005–1.030)
pH: 7 (ref 5.0–8.0)

## 2017-12-13 LAB — POCT PREGNANCY, URINE: Preg Test, Ur: NEGATIVE

## 2017-12-13 MED ORDER — KETOROLAC TROMETHAMINE 60 MG/2ML IM SOLN
60.0000 mg | Freq: Once | INTRAMUSCULAR | Status: AC
  Administered 2017-12-13: 60 mg via INTRAMUSCULAR
  Filled 2017-12-13: qty 2

## 2017-12-13 MED ORDER — IBUPROFEN 600 MG PO TABS: 600.0000 mg | ORAL_TABLET | Freq: Four times a day (QID) | ORAL | 1 refills | Status: DC | PRN

## 2017-12-13 NOTE — MAU Provider Note (Signed)
Chief Complaint: Fever and Abdominal Pain   First Provider Initiated Contact with Patient 12/13/17 1148      SUBJECTIVE HPI: Sherry Leach is a [age] y.o. G1P1001 who presents to maternity admissions reporting fever x 1 last night, abdominal cramping, and ongoing vaginal bleeding since her vaginal delivery 09/14/17.  She had a vaginal delivery on 8/15, then passed POCs at home with bleeding and had D&C on 09/22/17.  Pathology report indicates POCs following D&C.  Pt continues to bleed, with 1 day on, 1 day off, usually light but persistent bleeding. There is abdominal cramping associated with the bleeding. She is taking Tylenol but it is not helping. She has not tried any other treatments. She followed up in the office on 9/23, on 10/28, and on 11/2 and had US on 11/4 showing possible retained POCs vs endometrial polyp vs hyperplasia.  Hcg level was drawn on 12/08/17 and was <1.  Pt has f/u appointment with Anyanwu this Friday, on 12/15/17.   HPI  Past Medical History:  Diagnosis Date  . Medical history non-contributory   . Seasonal allergies    Past Surgical History:  Procedure Laterality Date  . DILATION AND EVACUATION N/A 09/22/2017   Procedure: DILATATION AND EVACUATION;  Surgeon: [place] BingPickens, Charlie, MD;  Location: [facility];  Service: Gynecology;  Laterality: N/A;  . MOUTH SURGERY     Social History   Socioeconomic History  . Marital status: Single    Spouse name: Not on file  . Number of children: Not on file  . Years of education: Not on file  . Highest education level: Not on file  Occupational History  . Not on file  Social Needs  . Financial resource strain: Not on file  . Food insecurity:    Worry: Not on file    Inability: Not on file  . Transportation needs:    Medical: Not on file    Non-medical: Not on file  Tobacco Use  . Smoking status: Current Some Day Smoker    Packs/day: 0.25  . Smokeless tobacco: Never Used  Substance and Sexual Activity  . Alcohol use: Yes     Comment: occasionally  . Drug use: No  . Sexual activity: Yes    Birth control/protection: Pill  Lifestyle  . Physical activity:    Days per week: Not on file    Minutes per session: Not on file  . Stress: Not on file  Relationships  . Social connections:    Talks on phone: Not on file    Gets together: Not on file    Attends religious service: Not on file    Active member of club or organization: Not on file    Attends meetings of clubs or organizations: Not on file    Relationship status: Not on file  . Intimate partner violence:    Fear of current or ex partner: Not on file    Emotionally abused: Not on file    Physically abused: Not on file    Forced sexual activity: Not on file  Other Topics Concern  . Not on file  Social History Narrative  . Not on file   No current facility-administered medications on file prior to encounter.    Current Outpatient Medications on File Prior to Encounter  Medication Sig Dispense Refill  . acetaminophen (TYLENOL) 500 MG tablet Take 500 mg by mouth every 6 (six) hours as needed for mild pain.    . ferrous sulfate 325 (65 FE) MG EC  tablet Take 1 tablet (325 mg total) by mouth 2 (two) times daily. 60 tablet 3  . norgestimate-ethinyl estradiol (ORTHO-CYCLEN,SPRINTEC,PREVIFEM) 0.25-35 MG-MCG tablet Take 1 tablet by mouth daily. 1 Package 11  . Prenatal Vit-Fe Fumarate-FA (PRENATAL MULTIVITAMIN) TABS tablet Take 1 tablet by mouth daily at 12 noon.     Allergies  Allergen Reactions  . Amoxicillin Rash    Has patient had a PCN reaction causing immediate rash, facial/tongue/throat swelling, SOB or lightheadedness with hypotension: No Has patient had a PCN reaction causing severe rash involving mucus membranes or skin necrosis: No Has patient had a PCN reaction that required hospitalization: No Has patient had a PCN reaction occurring within the last 10 years: Yes If all of the above answers are "NO", then may proceed with Cephalosporin use.    Marland Kitchen Penicillins Rash    Has patient had a PCN reaction causing immediate rash, facial/tongue/throat swelling, SOB or lightheadedness with hypotension: No Has patient had a PCN reaction causing severe rash involving mucus membranes or skin necrosis: No Has patient had a PCN reaction that required hospitalization: No Has patient had a PCN reaction occurring within the last 10 years: Yes If all of the above answers are "NO", then may proceed with Cephalosporin use.     ROS:  Review of Systems  Constitutional: Positive for fever. Negative for chills and fatigue.  Respiratory: Negative for shortness of breath.   Cardiovascular: Negative for chest pain.  Gastrointestinal: Negative for nausea and vomiting.  Genitourinary: Positive for pelvic pain and vaginal bleeding. Negative for difficulty urinating, dysuria, flank pain, vaginal discharge and vaginal pain.  Neurological: Negative for dizziness and headaches.  Psychiatric/Behavioral: Negative.      I have reviewed patient's Past Medical Hx, Surgical Hx, Family Hx, Social Hx, medications and allergies.   Physical Exam   Patient Vitals for the past 24 hrs:  BP Temp Temp src Pulse Resp SpO2 Height Weight  12/13/17 1358 127/71 98.6 F (37 C) Oral 61 18 99 % - -  12/13/17 0900 (!) 118/55 98.2 F (36.8 C) Oral 97 17 100 % 5\' 9"  (1.753 m) 80.2 kg   Constitutional: Well-developed, well-nourished female in no acute distress.  Cardiovascular: normal rate Respiratory: normal effort GI: Abd soft, non-tender. Pos BS x 4 MS: Extremities nontender, no edema, normal ROM Neurologic: Alert and oriented x 4.  GU: Neg CVAT.  PELVIC EXAM: Deferred  Pad count with no bleeding while pt in MAU   LAB RESULTS Results for orders placed or performed during the hospital encounter of 12/13/17 (from the past 24 hour(s))  Urinalysis, Routine w reflex microscopic     Status: Abnormal   Collection Time: 12/13/17 10:37 AM  Result Value Ref Range   Color,  Urine YELLOW YELLOW   APPearance HAZY (A) CLEAR   Specific Gravity, Urine 1.013 1.005 - 1.030   pH 7.0 5.0 - 8.0   Glucose, UA NEGATIVE NEGATIVE mg/dL   Hgb urine dipstick NEGATIVE NEGATIVE   Bilirubin Urine NEGATIVE NEGATIVE   Ketones, ur NEGATIVE NEGATIVE mg/dL   Protein, ur NEGATIVE NEGATIVE mg/dL   Nitrite NEGATIVE NEGATIVE   Leukocytes, UA NEGATIVE NEGATIVE  Pregnancy, urine POC     Status: None   Collection Time: 12/13/17 10:42 AM  Result Value Ref Range   Preg Test, Ur NEGATIVE NEGATIVE  CBC with Differential/Platelet     Status: Abnormal   Collection Time: 12/13/17 11:57 AM  Result Value Ref Range   WBC 5.3 4.0 - 10.5 K/uL  RBC 4.39 3.87 - 5.11 MIL/uL   Hemoglobin 10.3 (L) 12.0 - 15.0 g/dL   HCT 16.1 (L) 09.6 - 04.5 %   MCV 77.4 (L) 80.0 - 100.0 fL   MCH 23.5 (L) 26.0 - 34.0 pg   MCHC 30.3 30.0 - 36.0 g/dL   RDW 40.9 (H) 81.1 - 91.4 %   Platelets 163 150 - 400 K/uL   nRBC 0.0 0.0 - 0.2 %   Neutrophils Relative % 51 %   Neutro Abs 2.8 1.7 - 7.7 K/uL   Lymphocytes Relative 41 %   Lymphs Abs 2.2 0.7 - 4.0 K/uL   Monocytes Relative 4 %   Monocytes Absolute 0.2 0.1 - 1.0 K/uL   Eosinophils Relative 4 %   Eosinophils Absolute 0.2 0.0 - 0.5 K/uL   Basophils Relative 0 %   Basophils Absolute 0.0 0.0 - 0.1 K/uL    --/--/O NEG (08/15 0115)  IMAGING US Pelvic Complete With Transvaginal  Result Date: 12/04/2017 CLINICAL DATA:  22 year old female, 8 weeks post vaginal delivery, presenting with vaginal bleeding. Reported history of D&E for retained products of conception. EXAM: TRANSABDOMINAL AND TRANSVAGINAL ULTRASOUND OF PELVIS TECHNIQUE: Both transabdominal and transvaginal ultrasound examinations of the pelvis were performed. Transabdominal technique was performed for global imaging of the pelvis including uterus, ovaries, adnexal regions, and pelvic cul-de-sac. It was necessary to proceed with endovaginal exam following the transabdominal exam to visualize the endometrium  and adnexa. COMPARISON:  09/21/2017 pelvic sonogram. FINDINGS: Uterus Measurements: 10.3 x 4.5 x 7.1 = volume: 173 mL. Anteverted uterus is within normal recent post partum size limits, with no uterine fibroids or other myometrial abnormalities. Endometrium Thickness: 17 mm. There is a questionable focal 2.5 x 0.9 x 1.9 cm slightly hyperechoic lesion in the anterior uterine cavity with associated mild vascularity on color Doppler. No endometrial cavity fluid. Right ovary Measurements: 4.7 x 1.5 x 1.8 = volume: 6.8 mL. Normal appearance/no adnexal mass. Left ovary Measurements: 2.3 x 1.4 x 1.8 = volume: 3.0 mL. Normal appearance/no adnexal mass. Other findings No abnormal free fluid. IMPRESSION: 1. Thickened endometrium (17 mm). Possible focal 2.5 x 0.9 x 1.9 cm slightly hyperechoic lesion in the anterior uterine cavity with associated mild vascularity on color Doppler. Differential diagnosis includes persistent retained products of conception, endometrial hyperplasia or possible endometrial polyp. 2. No uterine fibroids. 3. Normal ovaries.  No adnexal masses. Electronically Signed   By: Delbert Phenix M.D.   On: 12/04/2017 09:27    MAU Management/MDM: Pt CBC with stable Hgb, no elevated WBCs, no indication of anemia or infection.  UA wnl but urine sent for culture.  Pt afebrile, well appearing and without bleeding in MAU. No acute abdomen noted.  Pt pain resolved with Toradol 60 mg IM given x 1.  Pt is taking OCPs, has 4 pills left before placebo pills.  Continue OCPs, start ibuprofen 600 mg Q 6 hours and keep scheduled f/u visit with Anyanwu this week.   Hcg level <1 makes retained POCs not likely, more likely other pathology like endometrial polyp causing bleeding pattern.  Pt discharged with strict bleeding/return precautions.  ASSESSMENT 1. Abnormal uterine bleeding (AUB)   2. Female pelvic pain   3. Dysmenorrhea     PLAN Discharge home Allergies as of 12/13/2017      Reactions   Amoxicillin Rash    Has patient had a PCN reaction causing immediate rash, facial/tongue/throat swelling, SOB or lightheadedness with hypotension: No Has patient had a PCN reaction causing severe  rash involving mucus membranes or skin necrosis: No Has patient had a PCN reaction that required hospitalization: No Has patient had a PCN reaction occurring within the last 10 years: Yes If all of the above answers are "NO", then may proceed with Cephalosporin use.   Penicillins Rash   Has patient had a PCN reaction causing immediate rash, facial/tongue/throat swelling, SOB or lightheadedness with hypotension: No Has patient had a PCN reaction causing severe rash involving mucus membranes or skin necrosis: No Has patient had a PCN reaction that required hospitalization: No Has patient had a PCN reaction occurring within the last 10 years: Yes If all of the above answers are "NO", then may proceed with Cephalosporin use.      Medication List    TAKE these medications   acetaminophen 500 MG tablet Commonly known as:  TYLENOL Take 500 mg by mouth every 6 (six) hours as needed for mild pain.   ferrous sulfate 325 (65 FE) MG EC tablet Take 1 tablet (325 mg total) by mouth 2 (two) times daily.   ibuprofen 600 MG tablet Commonly known as:  ADVIL,MOTRIN Take 1 tablet (600 mg total) by mouth every 6 (six) hours as needed.   norgestimate-ethinyl estradiol 0.25-35 MG-MCG tablet Commonly known as:  ORTHO-CYCLEN,SPRINTEC,PREVIFEM Take 1 tablet by mouth daily.   prenatal multivitamin Tabs tablet Take 1 tablet by mouth daily at 12 noon.      Follow-up Information    Anyanwu, Jethro Bastos, MD Follow up.   Specialty:  Obstetrics and Gynecology Why:  As scheduled 12/15/17. Return to MAU for emergencies. Contact information: [address] [place] [address] [phone]           Sharen Counter Certified Nurse-Midwife 12/13/2017  2:54 PM

## 2017-12-13 NOTE — MAU Note (Signed)
Pt presents with c/o abdominal pain & fever.  Reports has intermittent abdominal pain since NSVD 09/14/2017, but pain constant for last 3 days.  Reports has persistent retained POC and had surgery 1 week after delivery, had recent U/S and continues to have retained POC.  Reports also having intermittent VB x2 weeks, was previously constant VB. Completed antibiotics 3-4 days ago for BV

## 2017-12-14 LAB — URINE CULTURE

## 2017-12-15 ENCOUNTER — Ambulatory Visit (INDEPENDENT_AMBULATORY_CARE_PROVIDER_SITE_OTHER): Payer: Medicaid Other | Admitting: Obstetrics & Gynecology

## 2017-12-15 ENCOUNTER — Other Ambulatory Visit (HOSPITAL_COMMUNITY)
Admission: RE | Admit: 2017-12-15 | Discharge: 2017-12-15 | Disposition: A | Discharge status: Home / Self Care | Payer: Medicaid Other | Source: Ambulatory Visit | Attending: Obstetrics & Gynecology | Admitting: Obstetrics & Gynecology

## 2017-12-15 ENCOUNTER — Encounter: Payer: Self-pay | Admitting: Obstetrics & Gynecology

## 2017-12-15 VITALS — BP 131/78 | HR 102 | Wt 175.5 lb

## 2017-12-15 DIAGNOSIS — N898 Other specified noninflammatory disorders of vagina: Secondary | ICD-10-CM | POA: Insufficient documentation

## 2017-12-15 DIAGNOSIS — N939 Abnormal uterine and vaginal bleeding, unspecified: Secondary | ICD-10-CM | POA: Diagnosis not present

## 2017-12-15 DIAGNOSIS — N719 Inflammatory disease of uterus, unspecified: Secondary | ICD-10-CM

## 2017-12-15 DIAGNOSIS — R9389 Abnormal findings on diagnostic imaging of other specified body structures: Secondary | ICD-10-CM | POA: Diagnosis not present

## 2017-12-15 MED ORDER — TRAMADOL HCL 50 MG PO TABS: 50.0000 mg | ORAL_TABLET | Freq: Four times a day (QID) | ORAL | 0 refills | Status: DC | PRN

## 2017-12-15 MED ORDER — METRONIDAZOLE 500 MG PO TABS: 500.0000 mg | ORAL_TABLET | Freq: Two times a day (BID) | ORAL | 0 refills | Status: DC

## 2017-12-15 MED ORDER — DOXYCYCLINE HYCLATE 100 MG PO CAPS: 100.0000 mg | ORAL_CAPSULE | Freq: Two times a day (BID) | ORAL | 0 refills | Status: DC

## 2017-12-15 NOTE — H&P (View-Only) (Signed)
GYNECOLOGY OFFICE VISIT NOTE  History:  22 y.o. G1P1001 here today for evaluation of continued AUB in the setting of having a vaginal delivery three months ago (09/14/17), complicated by [facility] needing D&C on 09/22/17, and continued AUB afterwards. Bleeds at least every other day now since she started OCPs to help with this, recent ultrasound on 12/14/17 showed possible focal 2 cm endometrial thickening still concerning for possible RPOCs vs polyp (see report below). Of note, HCG on 12/11/17 was <1.  She was seen in MAU on 12/13/17 for bleeding, pain and fevers; she had negative labs and pain resolved with Toradol.  She was given NSAIDs and told to continue OCPs and follow up as scheduled today. Today, she is very frustrated and tired of bleeding all the time.  Reports spending over $200 on menstrual products since delivery. Denies further fevers, but still reports intense cramping. Also reports abnormal discharge, she wants evaluation for this.    Past Medical History:  Diagnosis Date  . Medical history non-contributory   . Seasonal allergies     Past Surgical History:  Procedure Laterality Date  . DILATION AND EVACUATION N/A 09/22/2017   Procedure: DILATATION AND EVACUATION;  Surgeon: [place] Bing, MD;  Location: [facility];  Service: Gynecology;  Laterality: N/A;  . MOUTH SURGERY      The following portions of the patient's history were reviewed and updated as appropriate: allergies, current medications, past family history, past medical history, past social history, past surgical history and problem list.   Health Maintenance:  Normal pap on 10/23/2017.  Review of Systems:  Pertinent items noted in HPI and remainder of comprehensive ROS otherwise negative.  Objective:  Physical Exam BP 131/78   Pulse (!) 102   Wt 175 lb 8 oz (79.6 kg)   LMP 09/14/2017   BMI 25.92 kg/m  CONSTITUTIONAL: Well-developed, well-nourished female in no acute distress.  HEENT:  Normocephalic, atraumatic.  External right and left ear normal. No scleral icterus.  NECK: Normal range of motion, supple, no masses noted on observation SKIN: Skin is warm and dry. No rash noted. Not diaphoretic. No erythema. No pallor. MUSCULOSKELETAL: Normal range of motion. No edema noted. NEUROLOGIC: Alert and oriented to person, place, and time. Normal muscle tone coordination. No cranial nerve deficit noted. PSYCHIATRIC: Normal mood and affect. Normal behavior. Normal judgment and thought content. CARDIOVASCULAR: Normal heart rate noted RESPIRATORY: Effort and breath sounds normal, no problems with respiration noted ABDOMEN: Soft, no distention noted, moderate lower abdominal tenderness, no rebound or guarding   PELVIC: Deferred  Labs and Imaging Results for orders placed or performed during the hospital encounter of 12/13/17 (from the past 168 hour(s))  Culture, Urine   Collection Time: 12/13/17 10:37 AM  Result Value Ref Range   Specimen Description      URINE, RANDOM Performed at [facility], [address], [place], [place] [address]    Special Requests      NONE Performed at [facility], [address], [place], [place] [address]    Culture (A)     40,000 COLONIES/mL LACTOBACILLUS SPECIES Standardized susceptibility testing for this organism is not available. Performed at [facility], [address], [place], [place] [address]    Report Status 12/14/2017 FINAL   Urinalysis, Routine w reflex microscopic   Collection Time: 12/13/17 10:37 AM  Result Value Ref Range   Color, Urine YELLOW YELLOW   APPearance HAZY (A) CLEAR   Specific Gravity, Urine 1.013 1.005 - 1.030  pH 7.0 5.0 - 8.0   Glucose, UA NEGATIVE NEGATIVE mg/dL   Hgb urine dipstick NEGATIVE NEGATIVE   Bilirubin Urine NEGATIVE NEGATIVE   Ketones, ur NEGATIVE NEGATIVE mg/dL   Protein, ur NEGATIVE NEGATIVE mg/dL   Nitrite NEGATIVE NEGATIVE   Leukocytes, UA NEGATIVE NEGATIVE  Pregnancy, urine POC   Collection Time:  12/13/17 10:42 AM  Result Value Ref Range   Preg Test, Ur NEGATIVE NEGATIVE  CBC with Differential/Platelet   Collection Time: 12/13/17 11:57 AM  Result Value Ref Range   WBC 5.3 4.0 - 10.5 K/uL   RBC 4.39 3.87 - 5.11 MIL/uL   Hemoglobin 10.3 (L) 12.0 - 15.0 g/dL   HCT 16.1 (L) 09.6 - 04.5 %   MCV 77.4 (L) 80.0 - 100.0 fL   MCH 23.5 (L) 26.0 - 34.0 pg   MCHC 30.3 30.0 - 36.0 g/dL   RDW 40.9 (H) 81.1 - 91.4 %   Platelets 163 150 - 400 K/uL   nRBC 0.0 0.0 - 0.2 %   Neutrophils Relative % 51 %   Neutro Abs 2.8 1.7 - 7.7 K/uL   Lymphocytes Relative 41 %   Lymphs Abs 2.2 0.7 - 4.0 K/uL   Monocytes Relative 4 %   Monocytes Absolute 0.2 0.1 - 1.0 K/uL   Eosinophils Relative 4 %   Eosinophils Absolute 0.2 0.0 - 0.5 K/uL   Basophils Relative 0 %   Basophils Absolute 0.0 0.0 - 0.1 K/uL  Results for orders placed or performed in visit on 12/11/17 (from the past 168 hour(s))  Beta hCG quant (ref lab)   Collection Time: 12/11/17  4:08 PM  Result Value Ref Range   hCG Quant <1 mIU/mL   US Pelvic Complete With Transvaginal  Result Date: 12/04/2017 CLINICAL DATA:  22 year old female, 8 weeks post vaginal delivery, presenting with vaginal bleeding. Reported history of D&E for retained products of conception. EXAM: TRANSABDOMINAL AND TRANSVAGINAL ULTRASOUND OF PELVIS TECHNIQUE: Both transabdominal and transvaginal ultrasound examinations of the pelvis were performed. Transabdominal technique was performed for global imaging of the pelvis including uterus, ovaries, adnexal regions, and pelvic cul-de-sac. It was necessary to proceed with endovaginal exam following the transabdominal exam to visualize the endometrium and adnexa. COMPARISON:  09/21/2017 pelvic sonogram. FINDINGS: Uterus Measurements: 10.3 x 4.5 x 7.1 = volume: 173 mL. Anteverted uterus is within normal recent post partum size limits, with no uterine fibroids or other myometrial abnormalities. Endometrium Thickness: 17 mm. There is a  questionable focal 2.5 x 0.9 x 1.9 cm slightly hyperechoic lesion in the anterior uterine cavity with associated mild vascularity on color Doppler. No endometrial cavity fluid. Right ovary Measurements: 4.7 x 1.5 x 1.8 = volume: 6.8 mL. Normal appearance/no adnexal mass. Left ovary Measurements: 2.3 x 1.4 x 1.8 = volume: 3.0 mL. Normal appearance/no adnexal mass. Other findings No abnormal free fluid. IMPRESSION: 1. Thickened endometrium (17 mm). Possible focal 2.5 x 0.9 x 1.9 cm slightly hyperechoic lesion in the anterior uterine cavity with associated mild vascularity on color Doppler. Differential diagnosis includes persistent retained products of conception, endometrial hyperplasia or possible endometrial polyp. 2. No uterine fibroids. 3. Normal ovaries.  No adnexal masses. Electronically Signed   By: Delbert Phenix M.D.   On: 12/04/2017 09:27    Assessment & Plan:  1. Endometritis Patient has possible endometritis given continued pain and bleeding. Will treat. - doxycycline (VIBRAMYCIN) 100 MG capsule; Take 1 capsule (100 mg total) by mouth 2 (two) times daily.  Dispense: 14 capsule; Refill:  0 - metroNIDAZOLE (FLAGYL) 500 MG tablet; Take 1 tablet (500 mg total) by mouth 2 (two) times daily.  Dispense: 14 tablet; Refill: 0 - traMADol (ULTRAM) 50 MG tablet; Take 1-2 tablets (50-100 mg total) by mouth every 6 (six) hours as needed for severe pain.  Dispense: 30 tablet; Refill: 0  2. Vaginal discharge - Cervicovaginal ancillary only (self-swab) done, will follow up results and manage accordingly.  3 Endometrial thickening on ultrasound 4. Abnormal uterine bleeding (AUB) Concerned about continued AUB, possible endometrial lesion. Recommended evaluation and management with Hysteroscopy, Dilation and Curettage.  The risks of surgery were discussed in detail with the patient including but not limited to: bleeding; infection; injury to surrounding organs; need for additional procedures; intrauterine  scarring and other postoperative or anesthesia complications.  Patient was told that the likelihood that her condition and symptoms will be treated effectively with this surgical management was very high; the postoperative expectations were also discussed in detail. The patient also understands the alternative treatment options which were discussed in full. All questions were answered.  She was told that she will be contacted by our surgical scheduler regarding the time and date of her surgery; routine preoperative instructions of having nothing to eat or drink after midnight on the day prior to surgery and also coming to the hospital 1.5 hours prior to her time of surgery were also emphasized.  She was told she may be called for a preoperative appointment about a week prior to surgery and will be given further preoperative instructions at that visit. Printed patient education handouts about the procedure were given to the patient to review at home.  Please refer to After Visit Summary for other counseling recommendations.   Return in about 3 weeks (around 01/05/2018) for Postoperative follow up.   Total face-to-face time with patient: 25 minutes.  Over 50% of encounter was spent on counseling and coordination of care.   Jaynie CollinsUGONNA  Shalinda Burkholder, MD, FACOG Obstetrician & Gynecologist, [facility], [facility]

## 2017-12-15 NOTE — Patient Instructions (Signed)
Hysteroscopy  Hysteroscopy is a procedure used for looking inside the womb (uterus). It may be done for various reasons, including:  · To evaluate abnormal bleeding, fibroid (benign, noncancerous) tumors, polyps, scar tissue (adhesions), and possibly cancer of the uterus.  · To look for lumps (tumors) and other uterine growths.  · To look for causes of why a woman cannot get pregnant (infertility), causes of recurrent loss of pregnancy (miscarriages), or a lost intrauterine device (IUD).  · To perform a sterilization by blocking the fallopian tubes from inside the uterus.    In this procedure, a thin, flexible tube with a tiny light and camera on the end of it (hysteroscope) is used to look inside the uterus. A hysteroscopy should be done right after a menstrual period to be sure you are not pregnant.  LET YOUR HEALTH CARE PROVIDER KNOW ABOUT:  · Any allergies you have.  · All medicines you are taking, including vitamins, herbs, eye drops, creams, and over-the-counter medicines.  · Previous problems you or members of your family have had with the use of anesthetics.  · Any blood disorders you have.  · Previous surgeries you have had.  · Medical conditions you have.  RISKS AND COMPLICATIONS  Generally, this is a safe procedure. However, as with any procedure, complications can occur. Possible complications include:  · Putting a hole in the uterus.  · Excessive bleeding.  · Infection.  · Damage to the cervix.  · Injury to other organs.  · Allergic reaction to medicines.  · Too much fluid used in the uterus for the procedure.    BEFORE THE PROCEDURE  · Ask your health care provider about changing or stopping any regular medicines.  · Do not take aspirin or blood thinners for 1 week before the procedure, or as directed by your health care provider. These can cause bleeding.  · If you smoke, do not smoke for 2 weeks before the procedure.  · In some cases, a medicine is placed in the cervix the day before the procedure.  This medicine makes the cervix have a larger opening (dilate). This makes it easier for the instrument to be inserted into the uterus during the procedure.  · Do not eat or drink anything for at least 8 hours before the surgery.  · Arrange for someone to take you home after the procedure.  PROCEDURE  · You may be given a medicine to relax you (sedative). You may also be given one of the following:  ? A medicine that numbs the area around the cervix (local anesthetic).  ? A medicine that makes you sleep through the procedure (general anesthetic).  · The hysteroscope is inserted through the vagina into the uterus. The camera on the hysteroscope sends a picture to a TV screen. This gives the surgeon a good view inside the uterus.  · During the procedure, air or a liquid is put into the uterus, which allows the surgeon to see better.  · Sometimes, tissue is gently scraped from inside the uterus. These tissue samples are sent to a lab for testing.  What to expect after the procedure  · If you had a general anesthetic, you may be groggy for a couple hours after the procedure.  · If you had a local anesthetic, you will be able to go home as soon as you are stable and feel ready.  · You may have some cramping. This normally lasts for a couple days.  · You may   have bleeding, which varies from light spotting for a few days to menstrual-like bleeding for 3-7 days. This is normal.  · If your test results are not back during the visit, make an appointment with your health care provider to find out the results.  This information is not intended to replace advice given to you by your health care provider. Make sure you discuss any questions you have with your health care provider.  Document Released: 04/25/2000 Document Revised: 06/25/2015 Document Reviewed: 08/16/2012  Elsevier Interactive Patient Education © 2017 [employer]

## 2017-12-15 NOTE — Progress Notes (Signed)
 GYNECOLOGY OFFICE VISIT NOTE  History:  22 y.o. G1P1001 here today for evaluation of continued AUB in the setting of having a vaginal delivery three months ago (09/14/17), complicated by RPOCs needing D&C on 09/22/17, and continued AUB afterwards. Bleeds at least every other day now since she started OCPs to help with this, recent ultrasound on 12/14/17 showed possible focal 2 cm endometrial thickening still concerning for possible RPOCs vs polyp (see report below). Of note, HCG on 12/11/17 was <1.  She was seen in MAU on 12/13/17 for bleeding, pain and fevers; she had negative labs and pain resolved with Toradol.  She was given NSAIDs and told to continue OCPs and follow up as scheduled today. Today, she is very frustrated and tired of bleeding all the time.  Reports spending over $200 on menstrual products since delivery. Denies further fevers, but still reports intense cramping. Also reports abnormal discharge, she wants evaluation for this.    Past Medical History:  Diagnosis Date  . Medical history non-contributory   . Seasonal allergies     Past Surgical History:  Procedure Laterality Date  . DILATION AND EVACUATION N/A 09/22/2017   Procedure: DILATATION AND EVACUATION;  Surgeon: Pickens, Charlie, MD;  Location: [facility];  Service: Gynecology;  Laterality: N/A;  . MOUTH SURGERY      The following portions of the patient's history were reviewed and updated as appropriate: allergies, current medications, past family history, past medical history, past social history, past surgical history and problem list.   Health Maintenance:  Normal pap on 10/23/2017.  Review of Systems:  Pertinent items noted in HPI and remainder of comprehensive ROS otherwise negative.  Objective:  Physical Exam BP 131/78   Pulse (!) 102   Wt 175 lb 8 oz (79.6 kg)   LMP 09/14/2017   BMI 25.92 kg/m  CONSTITUTIONAL: Well-developed, well-nourished female in no acute distress.  HEENT:  Normocephalic, atraumatic.  External right and left ear normal. No scleral icterus.  NECK: Normal range of motion, supple, no masses noted on observation SKIN: Skin is warm and dry. No rash noted. Not diaphoretic. No erythema. No pallor. MUSCULOSKELETAL: Normal range of motion. No edema noted. NEUROLOGIC: Alert and oriented to person, place, and time. Normal muscle tone coordination. No cranial nerve deficit noted. PSYCHIATRIC: Normal mood and affect. Normal behavior. Normal judgment and thought content. CARDIOVASCULAR: Normal heart rate noted RESPIRATORY: Effort and breath sounds normal, no problems with respiration noted ABDOMEN: Soft, no distention noted, moderate lower abdominal tenderness, no rebound or guarding   PELVIC: Deferred  Labs and Imaging Results for orders placed or performed during the hospital encounter of 12/13/17 (from the past 168 hour(s))  Culture, Urine   Collection Time: 12/13/17 10:37 AM  Result Value Ref Range   Specimen Description      URINE, RANDOM Performed at [facility], [address] [place], [place] [address]    Special Requests      NONE Performed at [facility], [address] [place], [place] [address]    Culture (A)     40,000 COLONIES/mL LACTOBACILLUS SPECIES Standardized susceptibility testing for this organism is not available. Performed at [facility], [address] [place], [place] [address]    Report Status 12/14/2017 FINAL   Urinalysis, Routine w reflex microscopic   Collection Time: 12/13/17 10:37 AM  Result Value Ref Range   Color, Urine YELLOW YELLOW   APPearance HAZY (A) CLEAR   Specific Gravity, Urine 1.013 1.005 - 1.030     pH 7.0 5.0 - 8.0   Glucose, UA NEGATIVE NEGATIVE mg/dL   Hgb urine dipstick NEGATIVE NEGATIVE   Bilirubin Urine NEGATIVE NEGATIVE   Ketones, ur NEGATIVE NEGATIVE mg/dL   Protein, ur NEGATIVE NEGATIVE mg/dL   Nitrite NEGATIVE NEGATIVE   Leukocytes, UA NEGATIVE NEGATIVE  Pregnancy, urine POC   Collection Time:  12/13/17 10:42 AM  Result Value Ref Range   Preg Test, Ur NEGATIVE NEGATIVE  CBC with Differential/Platelet   Collection Time: 12/13/17 11:57 AM  Result Value Ref Range   WBC 5.3 4.0 - 10.5 K/uL   RBC 4.39 3.87 - 5.11 MIL/uL   Hemoglobin 10.3 (L) 12.0 - 15.0 g/dL   HCT 34.0 (L) 36.0 - 46.0 %   MCV 77.4 (L) 80.0 - 100.0 fL   MCH 23.5 (L) 26.0 - 34.0 pg   MCHC 30.3 30.0 - 36.0 g/dL   RDW 16.4 (H) 11.5 - 15.5 %   Platelets 163 150 - 400 K/uL   nRBC 0.0 0.0 - 0.2 %   Neutrophils Relative % 51 %   Neutro Abs 2.8 1.7 - 7.7 K/uL   Lymphocytes Relative 41 %   Lymphs Abs 2.2 0.7 - 4.0 K/uL   Monocytes Relative 4 %   Monocytes Absolute 0.2 0.1 - 1.0 K/uL   Eosinophils Relative 4 %   Eosinophils Absolute 0.2 0.0 - 0.5 K/uL   Basophils Relative 0 %   Basophils Absolute 0.0 0.0 - 0.1 K/uL  Results for orders placed or performed in visit on 12/11/17 (from the past 168 hour(s))  Beta hCG quant (ref lab)   Collection Time: 12/11/17  4:08 PM  Result Value Ref Range   hCG Quant <1 mIU/mL   Us Pelvic Complete With Transvaginal  Result Date: 12/04/2017 CLINICAL DATA:  22-year-old female, 8 weeks post vaginal delivery, presenting with vaginal bleeding. Reported history of D&E for retained products of conception. EXAM: TRANSABDOMINAL AND TRANSVAGINAL ULTRASOUND OF PELVIS TECHNIQUE: Both transabdominal and transvaginal ultrasound examinations of the pelvis were performed. Transabdominal technique was performed for global imaging of the pelvis including uterus, ovaries, adnexal regions, and pelvic cul-de-sac. It was necessary to proceed with endovaginal exam following the transabdominal exam to visualize the endometrium and adnexa. COMPARISON:  09/21/2017 pelvic sonogram. FINDINGS: Uterus Measurements: 10.3 x 4.5 x 7.1 = volume: 173 mL. Anteverted uterus is within normal recent post partum size limits, with no uterine fibroids or other myometrial abnormalities. Endometrium Thickness: 17 mm. There is a  questionable focal 2.5 x 0.9 x 1.9 cm slightly hyperechoic lesion in the anterior uterine cavity with associated mild vascularity on color Doppler. No endometrial cavity fluid. Right ovary Measurements: 4.7 x 1.5 x 1.8 = volume: 6.8 mL. Normal appearance/no adnexal mass. Left ovary Measurements: 2.3 x 1.4 x 1.8 = volume: 3.0 mL. Normal appearance/no adnexal mass. Other findings No abnormal free fluid. IMPRESSION: 1. Thickened endometrium (17 mm). Possible focal 2.5 x 0.9 x 1.9 cm slightly hyperechoic lesion in the anterior uterine cavity with associated mild vascularity on color Doppler. Differential diagnosis includes persistent retained products of conception, endometrial hyperplasia or possible endometrial polyp. 2. No uterine fibroids. 3. Normal ovaries.  No adnexal masses. Electronically Signed   By: Jason A Poff M.D.   On: 12/04/2017 09:27    Assessment & Plan:  1. Endometritis Patient has possible endometritis given continued pain and bleeding. Will treat. - doxycycline (VIBRAMYCIN) 100 MG capsule; Take 1 capsule (100 mg total) by mouth 2 (two) times daily.  Dispense: 14 capsule; Refill:   0 - metroNIDAZOLE (FLAGYL) 500 MG tablet; Take 1 tablet (500 mg total) by mouth 2 (two) times daily.  Dispense: 14 tablet; Refill: 0 - traMADol (ULTRAM) 50 MG tablet; Take 1-2 tablets (50-100 mg total) by mouth every 6 (six) hours as needed for severe pain.  Dispense: 30 tablet; Refill: 0  2. Vaginal discharge - Cervicovaginal ancillary only (self-swab) done, will follow up results and manage accordingly.  3 Endometrial thickening on ultrasound 4. Abnormal uterine bleeding (AUB) Concerned about continued AUB, possible endometrial lesion. Recommended evaluation and management with Hysteroscopy, Dilation and Curettage.  The risks of surgery were discussed in detail with the patient including but not limited to: bleeding; infection; injury to surrounding organs; need for additional procedures; intrauterine  scarring and other postoperative or anesthesia complications.  Patient was told that the likelihood that her condition and symptoms will be treated effectively with this surgical management was very high; the postoperative expectations were also discussed in detail. The patient also understands the alternative treatment options which were discussed in full. All questions were answered.  She was told that she will be contacted by our surgical scheduler regarding the time and date of her surgery; routine preoperative instructions of having nothing to eat or drink after midnight on the day prior to surgery and also coming to the hospital 1.5 hours prior to her time of surgery were also emphasized.  She was told she may be called for a preoperative appointment about a week prior to surgery and will be given further preoperative instructions at that visit. Printed patient education handouts about the procedure were given to the patient to review at home.  Please refer to After Visit Summary for other counseling recommendations.   Return in about 3 weeks (around 01/05/2018) for Postoperative follow up.   Total face-to-face time with patient: 25 minutes.  Over 50% of encounter was spent on counseling and coordination of care.   Asheley Hellberg, MD, FACOG Obstetrician & Gynecologist, [facility], [facility]  

## 2017-12-18 ENCOUNTER — Other Ambulatory Visit: Payer: Self-pay

## 2017-12-18 ENCOUNTER — Encounter (HOSPITAL_COMMUNITY): Payer: Self-pay | Admitting: *Deleted

## 2017-12-18 LAB — CERVICOVAGINAL ANCILLARY ONLY
BACTERIAL VAGINITIS: NEGATIVE
CHLAMYDIA, DNA PROBE: NEGATIVE
Candida vaginitis: NEGATIVE
NEISSERIA GONORRHEA: NEGATIVE
Trichomonas: NEGATIVE

## 2017-12-19 ENCOUNTER — Ambulatory Visit (HOSPITAL_COMMUNITY)
Admission: RE | Admit: 2017-12-19 | Discharge: 2017-12-19 | Disposition: A | Discharge status: Home / Self Care | Payer: Medicaid Other | Source: Ambulatory Visit | Attending: Obstetrics & Gynecology | Admitting: Obstetrics & Gynecology

## 2017-12-19 ENCOUNTER — Encounter (HOSPITAL_COMMUNITY)
Admission: RE | Disposition: A | Discharge status: Home / Self Care | Payer: Self-pay | Source: Ambulatory Visit | Attending: Obstetrics & Gynecology

## 2017-12-19 ENCOUNTER — Other Ambulatory Visit: Payer: Self-pay

## 2017-12-19 ENCOUNTER — Ambulatory Visit (HOSPITAL_COMMUNITY): Payer: Medicaid Other | Admitting: Anesthesiology

## 2017-12-19 ENCOUNTER — Encounter (HOSPITAL_COMMUNITY): Payer: Self-pay

## 2017-12-19 DIAGNOSIS — R9389 Abnormal findings on diagnostic imaging of other specified body structures: Secondary | ICD-10-CM | POA: Diagnosis not present

## 2017-12-19 DIAGNOSIS — D649 Anemia, unspecified: Secondary | ICD-10-CM | POA: Insufficient documentation

## 2017-12-19 DIAGNOSIS — F17200 Nicotine dependence, unspecified, uncomplicated: Secondary | ICD-10-CM | POA: Insufficient documentation

## 2017-12-19 DIAGNOSIS — N939 Abnormal uterine and vaginal bleeding, unspecified: Secondary | ICD-10-CM | POA: Insufficient documentation

## 2017-12-19 HISTORY — DX: Anemia, unspecified: D64.9

## 2017-12-19 HISTORY — PX: HYSTEROSCOPY WITH D & C: SHX1775

## 2017-12-19 HISTORY — PX: HYSTEROSCOPY W/D&C: SHX1775

## 2017-12-19 LAB — CBC
HCT: 37 % (ref 36.0–46.0)
Hemoglobin: 11.3 g/dL — ABNORMAL LOW (ref 12.0–15.0)
MCH: 23.4 pg — AB (ref 26.0–34.0)
MCHC: 30.5 g/dL (ref 30.0–36.0)
MCV: 76.8 fL — ABNORMAL LOW (ref 80.0–100.0)
Platelets: 209 10*3/uL (ref 150–400)
RBC: 4.82 MIL/uL (ref 3.87–5.11)
RDW: 16.4 % — ABNORMAL HIGH (ref 11.5–15.5)
WBC: 8.5 10*3/uL (ref 4.0–10.5)
nRBC: 0 % (ref 0.0–0.2)

## 2017-12-19 LAB — PREGNANCY, URINE: PREG TEST UR: NEGATIVE

## 2017-12-19 SURGERY — DILATATION AND CURETTAGE /HYSTEROSCOPY
Anesthesia: General | Site: Vagina

## 2017-12-19 MED ORDER — MEPERIDINE HCL 25 MG/ML IJ SOLN
6.2500 mg | INTRAMUSCULAR | Status: DC | PRN
  Administered 2017-12-19 (×2): 6.25 mg via INTRAVENOUS

## 2017-12-19 MED ORDER — IBUPROFEN 600 MG PO TABS: 600.0000 mg | ORAL_TABLET | Freq: Four times a day (QID) | ORAL | 1 refills | Status: DC | PRN

## 2017-12-19 MED ORDER — OXYCODONE HCL 5 MG PO TABS: 5.0000 mg | ORAL_TABLET | Freq: Once | ORAL | Status: DC | PRN

## 2017-12-19 MED ORDER — PROPOFOL 10 MG/ML IV BOLUS
INTRAVENOUS | Status: AC
  Filled 2017-12-19: qty 20

## 2017-12-19 MED ORDER — GLYCOPYRROLATE 0.2 MG/ML IJ SOLN
INTRAMUSCULAR | Status: AC
  Filled 2017-12-19: qty 1

## 2017-12-19 MED ORDER — LIDOCAINE HCL (CARDIAC) PF 100 MG/5ML IV SOSY
PREFILLED_SYRINGE | INTRAVENOUS | Status: DC | PRN
  Administered 2017-12-19: 50 mg via INTRAVENOUS

## 2017-12-19 MED ORDER — ONDANSETRON HCL 4 MG/2ML IJ SOLN: 4.0000 mg | Freq: Once | INTRAMUSCULAR | Status: DC | PRN

## 2017-12-19 MED ORDER — LIDOCAINE HCL (CARDIAC) PF 100 MG/5ML IV SOSY
PREFILLED_SYRINGE | INTRAVENOUS | Status: AC
  Filled 2017-12-19: qty 5

## 2017-12-19 MED ORDER — BUPIVACAINE HCL (PF) 0.5 % IJ SOLN
INTRAMUSCULAR | Status: AC
  Filled 2017-12-19: qty 30

## 2017-12-19 MED ORDER — FENTANYL CITRATE (PF) 100 MCG/2ML IJ SOLN
INTRAMUSCULAR | Status: AC
  Filled 2017-12-19: qty 2

## 2017-12-19 MED ORDER — LACTATED RINGERS IV SOLN
INTRAVENOUS | Status: DC
  Administered 2017-12-19 (×3): via INTRAVENOUS

## 2017-12-19 MED ORDER — BUPIVACAINE HCL 0.5 % IJ SOLN
INTRAMUSCULAR | Status: DC | PRN
  Administered 2017-12-19: 30 mL

## 2017-12-19 MED ORDER — GLYCOPYRROLATE 0.2 MG/ML IJ SOLN
INTRAMUSCULAR | Status: DC | PRN
  Administered 2017-12-19: 0.1 mg via INTRAVENOUS

## 2017-12-19 MED ORDER — ACETAMINOPHEN 160 MG/5ML PO SOLN: 325.0000 mg | ORAL | Status: DC | PRN

## 2017-12-19 MED ORDER — DEXAMETHASONE SODIUM PHOSPHATE 4 MG/ML IJ SOLN
INTRAMUSCULAR | Status: AC
  Filled 2017-12-19: qty 1

## 2017-12-19 MED ORDER — FENTANYL CITRATE (PF) 100 MCG/2ML IJ SOLN
INTRAMUSCULAR | Status: DC | PRN
  Administered 2017-12-19 (×2): 50 ug via INTRAVENOUS

## 2017-12-19 MED ORDER — MIDAZOLAM HCL 2 MG/2ML IJ SOLN
INTRAMUSCULAR | Status: AC
  Filled 2017-12-19: qty 2

## 2017-12-19 MED ORDER — ONDANSETRON HCL 4 MG/2ML IJ SOLN
INTRAMUSCULAR | Status: AC
  Filled 2017-12-19: qty 2

## 2017-12-19 MED ORDER — PROPOFOL 10 MG/ML IV BOLUS
INTRAVENOUS | Status: DC | PRN
  Administered 2017-12-19: 200 mg via INTRAVENOUS

## 2017-12-19 MED ORDER — SODIUM CHLORIDE 0.9 % IR SOLN
Status: DC | PRN
  Administered 2017-12-19: 3000 mL

## 2017-12-19 MED ORDER — KETOROLAC TROMETHAMINE 30 MG/ML IJ SOLN
INTRAMUSCULAR | Status: DC | PRN
  Administered 2017-12-19: 30 mg via INTRAVENOUS

## 2017-12-19 MED ORDER — ONDANSETRON HCL 4 MG/2ML IJ SOLN
INTRAMUSCULAR | Status: DC | PRN
  Administered 2017-12-19: 4 mg via INTRAVENOUS

## 2017-12-19 MED ORDER — OXYCODONE-ACETAMINOPHEN 5-325 MG PO TABS: 1.0000 | ORAL_TABLET | ORAL | 0 refills | Status: DC | PRN

## 2017-12-19 MED ORDER — OXYCODONE HCL 5 MG/5ML PO SOLN: 5.0000 mg | Freq: Once | ORAL | Status: DC | PRN

## 2017-12-19 MED ORDER — FENTANYL CITRATE (PF) 100 MCG/2ML IJ SOLN: 25.0000 ug | INTRAMUSCULAR | Status: DC | PRN

## 2017-12-19 MED ORDER — SCOPOLAMINE 1 MG/3DAYS TD PT72
MEDICATED_PATCH | TRANSDERMAL | Status: AC
  Filled 2017-12-19: qty 1

## 2017-12-19 MED ORDER — KETOROLAC TROMETHAMINE 30 MG/ML IJ SOLN
INTRAMUSCULAR | Status: AC
  Filled 2017-12-19: qty 1

## 2017-12-19 MED ORDER — SCOPOLAMINE 1 MG/3DAYS TD PT72
MEDICATED_PATCH | TRANSDERMAL | Status: AC
  Administered 2017-12-19: 1.5 mg via TRANSDERMAL
  Filled 2017-12-19: qty 1

## 2017-12-19 MED ORDER — MIDAZOLAM HCL 2 MG/2ML IJ SOLN
INTRAMUSCULAR | Status: DC | PRN
  Administered 2017-12-19: 2 mg via INTRAVENOUS

## 2017-12-19 MED ORDER — DOCUSATE SODIUM 100 MG PO CAPS: 100.0000 mg | ORAL_CAPSULE | Freq: Two times a day (BID) | ORAL | 2 refills | Status: DC | PRN

## 2017-12-19 MED ORDER — DEXAMETHASONE SODIUM PHOSPHATE 4 MG/ML IJ SOLN
INTRAMUSCULAR | Status: DC | PRN
  Administered 2017-12-19: 4 mg via INTRAVENOUS

## 2017-12-19 MED ORDER — MEPERIDINE HCL 25 MG/ML IJ SOLN
INTRAMUSCULAR | Status: AC
  Filled 2017-12-19: qty 1

## 2017-12-19 MED ORDER — SCOPOLAMINE 1 MG/3DAYS TD PT72
1.0000 | MEDICATED_PATCH | Freq: Once | TRANSDERMAL | Status: DC
  Administered 2017-12-19: 1.5 mg via TRANSDERMAL

## 2017-12-19 MED ORDER — ACETAMINOPHEN 325 MG PO TABS: 325.0000 mg | ORAL_TABLET | ORAL | Status: DC | PRN

## 2017-12-19 SURGICAL SUPPLY — 11 items
BIPOLAR CUTTING LOOP 21FR (ELECTRODE)
CATH ROBINSON RED A/P 16FR (CATHETERS) ×3 IMPLANT
GLOVE BIOGEL PI IND STRL 7.0 (GLOVE) ×1 IMPLANT
GLOVE BIOGEL PI INDICATOR 7.0 (GLOVE) ×2
GLOVE ECLIPSE 7.0 STRL STRAW (GLOVE) ×3 IMPLANT
GOWN STRL REUS W/TWL LRG LVL3 (GOWN DISPOSABLE) ×6 IMPLANT
KIT PROCEDURE FLUENT (KITS) ×6 IMPLANT
LOOP CUTTING BIPOLAR 21FR (ELECTRODE) IMPLANT
PACK VAGINAL MINOR WOMEN LF (CUSTOM PROCEDURE TRAY) ×3 IMPLANT
PAD OB MATERNITY 4.3X12.25 (PERSONAL CARE ITEMS) ×3 IMPLANT
TOWEL OR 17X24 6PK STRL BLUE (TOWEL DISPOSABLE) ×6 IMPLANT

## 2017-12-19 NOTE — Op Note (Signed)
PREOPERATIVE DIAGNOSIS:  Abnormal uterine bleeding after delivery three months ago, possible endometrial lesion POSTOPERATIVE DIAGNOSIS: The same PROCEDURE: Diagnostic Hysteroscopy, Dilation and Curettage. SURGEON:  Dr. Jaynie CollinsUgonna Kailin Leu  INDICATIONS: 22 y.o. G1P1001  here for scheduled surgery for the aforementioned diagnoses.   Risks of surgery were discussed with the patient including but not limited to: bleeding which may require transfusion; infection which may require antibiotics; injury to uterus or surrounding organs; intrauterine scarring which may impair future fertility; need for additional procedures including laparotomy or laparoscopy; and other postoperative/anesthesia complications. Written informed consent was obtained.    FINDINGS:  A 10 week size uterus.  Diffuse proliferative endometrium, some tissue seen anteriorly at the junction of the lower uterine segment and cervix. This was scraped.  Normal ostia bilaterally.  ANESTHESIA:  General, paracervical block with 30 ml of 0.5% Marcaine ESTIMATED BLOOD LOSS:  25 ml SPECIMENS: Endometrial curettings sent to pathology COMPLICATIONS:  None immediate.  PROCEDURE DETAILS:  The patient was then taken to the operating room where general anesthesia was administered and was found to be adequate.  After an adequate timeout was performed, she was placed in the dorsal lithotomy position and examined; then prepped and draped in the sterile manner.   Her bladder was catheterized for an unmeasured amount of clear, yellow urine. A speculum was then placed in the patient's vagina and a single tooth tenaculum was applied to the anterior lip of the cervix.   A paracervical block using 30 ml of 0.5% Marcaine was administered.  The uterus was sounded to 10 cm and the cervix was dilated manually with metal dilators to accommodate the 5 mm diagnostic hysteroscope.  The hysteroscope was then inserted under direct visualization using NS as a suspension medium.   The uterine cavity was carefully examined with the findings as noted above.   After further careful visualization of the uterine cavity, the hysteroscope was removed under direct visualization.  A sharp curettage was then performed to obtain a moderate amount of endometrial curettings.  The tenaculum was removed from the anterior lip of the cervix and the vaginal speculum was removed after noting good hemostasis.  The patient tolerated the procedure well and was taken to the recovery area awake, extubated and in stable condition.  The patient will be discharged to home as per PACU criteria.  Routine postoperative instructions given.  She was prescribed Percocet, Ibuprofen and Colace.  She will follow up in the clinic on 01/05/2018  for postoperative evaluation.   Jaynie CollinsUGONNA  Azriel Dancy, MD, FACOG Obstetrician & Gynecologist, [facility], [facility]

## 2017-12-19 NOTE — Discharge Instructions (Signed)
Dilation and Curettage or Vacuum Curettage, Care After These instructions give you information about caring for yourself after your procedure. Your doctor may also give you more specific instructions. Call your doctor if you have any problems or questions after your procedure. Follow these instructions at home: Activity  Do not drive or use heavy machinery while taking prescription pain medicine.  For 24 hours after your procedure, avoid driving.  Take short walks often, followed by rest periods. Ask your doctor what activities are safe for you. After one or two days, you may be able to return to your normal activities.  Do not lift anything that is heavier than 10 lb (4.5 kg) until your doctor approves.  For at least 2 weeks, or as long as told by your doctor: ? Do not douche. ? Do not use tampons. ? Do not have sex. General instructions  Take over-the-counter and prescription medicines only as told by your doctor. This is very important if you take blood thinning medicine.  Do not take baths, swim, or use a hot tub until your doctor approves. Take showers instead of baths.  Wear compression stockings as told by your doctor.  It is up to you to get the results of your procedure. Ask your doctor when your results will be ready.  Keep all follow-up visits as told by your doctor. This is important. Contact a doctor if:  You have very bad cramps that get worse or do not get better with medicine.  You have very bad pain in your belly (abdomen).  You cannot drink fluids without throwing up (vomiting).  You get pain in a different part of the area between your belly and thighs (pelvis).  You have bad-smelling discharge from your vagina.  You have a rash. Get help right away if:  You are bleeding a lot from your vagina. A lot of bleeding means soaking more than one sanitary pad in an hour, for 2 hours in a row.  You have clumps of blood (blood clots) coming from your  vagina.  You have a fever or chills.  Your belly feels very tender or hard.  You have chest pain.  You have trouble breathing.  You cough up blood.  You feel dizzy.  You feel light-headed.  You pass out (faint).  You have pain in your neck or shoulder area. Summary  Take short walks often, followed by rest periods. Ask your doctor what activities are safe for you. After one or two days, you may be able to return to your normal activities.  Do not lift anything that is heavier than 10 lb (4.5 kg) until your doctor approves.  Do not take baths, swim, or use a hot tub until your doctor approves. Take showers instead of baths.  Contact your doctor if you have any symptoms of infection, like bad-smelling discharge from your vagina. This information is not intended to replace advice given to you by your health care provider. Make sure you discuss any questions you have with your health care provider. Document Released: 10/27/2007 Document Revised: 10/05/2015 Document Reviewed: 10/05/2015 Elsevier Interactive Patient Education  2017 [employer]  DISCHARGE INSTRUCTIONS: HYSTEROSCOPY The following instructions have been prepared to help you care for yourself upon your return home.  May Remove Scop patch on or before Friday, December 22, 2017.  May take Ibuprofen after 5:15 pm today, December 19, 2017.  May take stool softner while taking narcotic pain medication to prevent constipation.  Drink plenty of water.  Personal hygiene:  Use sanitary pads for vaginal drainage, not tampons.  Shower the day after your procedure.  NO tub baths, pools or Jacuzzis for 2-3 weeks.  Wipe front to back after using the bathroom.  Activity and limitations:  Do NOT drive or operate any equipment for 24 hours. The effects of anesthesia are still present and drowsiness may result.  Do NOT rest in bed all day.  Walking is encouraged.  Walk up and down stairs slowly.  You may resume  your normal activity in one to two days or as indicated by your physician.  Sexual activity: NO intercourse for at least 2 weeks after the procedure, or as indicated by your Doctor.  Return to Work: You may resume your work activities in one to two days or as indicated by Therapist, sportsyour Doctor.  What to expect after your surgery: Expect to have vaginal bleeding/discharge for 2-3 days and spotting for up to 10 days. It is not unusual to have soreness for up to 1-2 weeks. You may have a slight burning sensation when you urinate for the first day. Mild cramps may continue for a couple of days. You may have a regular period in 2-6 weeks.  Call your doctor for any of the following:  Excessive vaginal bleeding or clotting, saturating and changing one pad every hour.  Inability to urinate 6 hours after discharge from hospital.  Pain not relieved by pain medication.  Fever of 100.4 F or greater.  Unusual vaginal discharge or odor.  Post Anesthesia Home Care Instructions  Activity: Get plenty of rest for the remainder of the day. A responsible individual must stay with you for 24 hours following the procedure.  For the next 24 hours, DO NOT: -Drive a car -Advertising copywriterperate machinery -Drink alcoholic beverages -Take any medication unless instructed by your physician -Make any legal decisions or sign important papers.  Meals: Start with liquid foods such as gelatin or soup. Progress to regular foods as tolerated. Avoid greasy, spicy, heavy foods. If nausea and/or vomiting occur, drink only clear liquids until the nausea and/or vomiting subsides. Call your physician if vomiting continues.  Special Instructions/Symptoms: Your throat may feel dry or sore from the anesthesia or the breathing tube placed in your throat during surgery. If this causes discomfort, gargle with warm salt water. The discomfort should disappear within 24 hours.  If you had a scopolamine patch placed behind your ear for the  management of post- operative nausea and/or vomiting:  1. The medication in the patch is effective for 72 hours, after which it should be removed.  Wrap patch in a tissue and discard in the trash. Wash hands thoroughly with soap and water. 2. You may remove the patch earlier than 72 hours if you experience unpleasant side effects which may include dry mouth, dizziness or visual disturbances. 3. Avoid touching the patch. Wash your hands with soap and water after contact with the patch.

## 2017-12-19 NOTE — Anesthesia Postprocedure Evaluation (Signed)
Anesthesia Post Note  Patient: Sherry Leach  Procedure(s) Performed: DILATATION AND CURETTAGE /DIAGNOSTIC HYSTEROSCOPY (N/A Vagina )     Patient location during evaluation: PACU Anesthesia Type: General Level of consciousness: awake and alert Pain management: pain level controlled Vital Signs Assessment: post-procedure vital signs reviewed and stable Respiratory status: spontaneous breathing, nonlabored ventilation, respiratory function stable and patient connected to nasal cannula oxygen Cardiovascular status: blood pressure returned to baseline and stable Postop Assessment: no apparent nausea or vomiting Anesthetic complications: no    Last Vitals:  Vitals:   12/19/17 1230 12/19/17 1255  BP:  121/67  Pulse: 83 61  Resp: 17 16  Temp:  36.6 C  SpO2: 100% 100%    Last Pain:  Vitals:   12/19/17 1255  TempSrc:   PainSc: 3    Pain Goal: Patients Stated Pain Goal: 4 (12/19/17 1255)               [name]

## 2017-12-19 NOTE — Transfer of Care (Signed)
Immediate Anesthesia Transfer of Care Note  Patient: Sherry Leach C Check  Procedure(s) Performed: DILATATION AND CURETTAGE /DIAGNOSTIC HYSTEROSCOPY (N/A Vagina )  Patient Location: PACU  Anesthesia Type:General  Level of Consciousness: awake, oriented and patient cooperative  Airway & Oxygen Therapy: Patient Spontanous Breathing  Post-op Assessment: Report given to RN and Post -op Vital signs reviewed and stable  Post vital signs: Reviewed and stable  Last Vitals:  Vitals Value Taken Time  BP 117/77 12/19/2017 11:38 AM  Temp    Pulse 103 12/19/2017 11:42 AM  Resp 18 12/19/2017 11:42 AM  SpO2 100 % 12/19/2017 11:42 AM  Vitals shown include unvalidated device data.  Last Pain:  Vitals:   12/19/17 0950  TempSrc: Oral  PainSc: 4       Patients Stated Pain Goal: 4 (12/19/17 0950)  Complications: No apparent anesthesia complications

## 2017-12-19 NOTE — Anesthesia Procedure Notes (Signed)
Procedure Name: LMA Insertion Date/Time: 12/19/2017 10:48 AM Performed by: Trellis PaganiniBrewer, Danyel Griess N, CRNA Patient Re-evaluated:Patient Re-evaluated prior to induction Oxygen Delivery Method: Circle system utilized Preoxygenation: Pre-oxygenation with 100% oxygen Induction Type: IV induction LMA: LMA inserted LMA Size: 4.0 Number of attempts: 1 Placement Confirmation: breath sounds checked- equal and bilateral Dental Injury: Teeth and Oropharynx as per pre-operative assessment

## 2017-12-19 NOTE — Anesthesia Preprocedure Evaluation (Signed)
Anesthesia Evaluation  Patient identified by MRN, date of birth, ID band Patient awake    Reviewed: Allergy & Precautions, H&P , NPO status , Patient's Chart, lab work & pertinent test results, reviewed documented beta blocker date and time   Airway Mallampati: II  TM Distance: >3 FB Neck ROM: full    Dental no notable dental hx.    Pulmonary neg pulmonary ROS, Current Smoker,    Pulmonary exam normal breath sounds clear to auscultation       Cardiovascular Exercise Tolerance: Good negative cardio ROS   Rhythm:regular Rate:Normal     Neuro/Psych negative neurological ROS  negative psych ROS   GI/Hepatic negative GI ROS, Neg liver ROS,   Endo/Other  negative endocrine ROS  Renal/GU negative Renal ROS  negative genitourinary   Musculoskeletal   Abdominal   Peds  Hematology negative hematology ROS (+) Blood dyscrasia, anemia ,   Anesthesia Other Findings   Reproductive/Obstetrics negative OB ROS                             Anesthesia Physical Anesthesia Plan  ASA: II  Anesthesia Plan: General   Post-op Pain Management:    Induction: Intravenous  PONV Risk Score and Plan: 3 and Ondansetron, Dexamethasone, Treatment may vary due to age or medical condition and Midazolam  Airway Management Planned: LMA  Additional Equipment:   Intra-op Plan:   Post-operative Plan:   Informed Consent: I have reviewed the patients History and Physical, chart, labs and discussed the procedure including the risks, benefits and alternatives for the proposed anesthesia with the patient or authorized representative who has indicated his/her understanding and acceptance.   Dental Advisory Given  Plan Discussed with: CRNA, Anesthesiologist and Surgeon  Anesthesia Plan Comments: ( )        Anesthesia Quick Evaluation

## 2017-12-19 NOTE — Interval H&P Note (Signed)
History and Physical Interval Note 12/19/2017 10:33 AM  Sherry Leach  has presented today for surgery, with the diagnosis of AUB, Possible Retained Products, Endometrial Lesion  The various methods of treatment have been discussed with the patient and family. After consideration of risks, benefits and other options for treatment, the patient has consented to  Procedure(s) with comments: DILATATION AND CURETTAGE /HYSTEROSCOPY (N/A) - rep will be here confirmed on 12/18/17 as a surgical intervention .  The patient's history has been reviewed, patient examined, no change in status, stable for surgery.  I have reviewed the patient's chart and labs.  Questions were answered to the patient's satisfaction.  To OR when ready.   Jaynie CollinsUgonna Karrington Studnicka, MD

## 2017-12-20 ENCOUNTER — Encounter (HOSPITAL_COMMUNITY): Payer: Self-pay | Admitting: Obstetrics & Gynecology

## 2018-01-05 ENCOUNTER — Ambulatory Visit: Payer: Medicaid Other | Admitting: Obstetrics & Gynecology

## 2018-01-05 NOTE — Progress Notes (Deleted)
   Patient did not show up today for her scheduled appointment.   UGONNA  ANYANWU, MD, FACOG Obstetrician & Gynecologist, [facility], [facility]  

## 2018-03-19 ENCOUNTER — Encounter: Payer: Self-pay | Admitting: Internal Medicine

## 2018-03-19 ENCOUNTER — Ambulatory Visit (INDEPENDENT_AMBULATORY_CARE_PROVIDER_SITE_OTHER): Payer: Medicaid Other | Admitting: Internal Medicine

## 2018-03-19 ENCOUNTER — Other Ambulatory Visit (HOSPITAL_COMMUNITY)
Admission: RE | Admit: 2018-03-19 | Discharge: 2018-03-19 | Disposition: A | Discharge status: Home / Self Care | Payer: Medicaid Other | Source: Ambulatory Visit | Attending: Internal Medicine | Admitting: Internal Medicine

## 2018-03-19 VITALS — BP 120/79 | HR 80 | Wt 167.8 lb

## 2018-03-19 DIAGNOSIS — Z3042 Encounter for surveillance of injectable contraceptive: Secondary | ICD-10-CM | POA: Diagnosis not present

## 2018-03-19 DIAGNOSIS — Z3202 Encounter for pregnancy test, result negative: Secondary | ICD-10-CM | POA: Diagnosis not present

## 2018-03-19 DIAGNOSIS — N898 Other specified noninflammatory disorders of vagina: Secondary | ICD-10-CM | POA: Insufficient documentation

## 2018-03-19 LAB — POCT PREGNANCY, URINE: PREG TEST UR: NEGATIVE

## 2018-03-19 MED ORDER — MEDROXYPROGESTERONE ACETATE 150 MG/ML IM SUSP
150.0000 mg | Freq: Once | INTRAMUSCULAR | Status: AC
  Administered 2018-03-19: 150 mg via INTRAMUSCULAR

## 2018-03-19 NOTE — Progress Notes (Signed)
   Subjective:    KENDRIANA FORSTON - 23 y.o. female MRN [id number]  Date of birth: May 23, 1995  HPI  MYKIRA CHISOLM is a 23 y.o. G15P1001 female here for vaginal discharge and to discuss contraceptive options.   Vaginal Discharge: Started about 3-4 days ago. Is white in color and appears normal but is concerned by the increased amount. Does have a slight odor to it. Denies itchiness or irritation. Denies fevers, pelvic pain, vomiting, dysuria. Has a history of Chlamydia so would like to be tested for STDs. Declines HIV and RPR. Has had unprotected sexual intercourse about 2-3 weeks ago.   Contraception Management: Currently taking OCPs but occasionally misses pills and would like to switch to a more reliable form of contraception. Not interested in LARC because she does not want anything that goes inside her body. Requests Depo as contraceptive method.      OB History    Gravida  1   Para  1   Term  1   Preterm      AB      Living  1     SAB      TAB      Ectopic      Multiple  0   Live Births  1           -  reports that she has been smoking cigarettes. She has a 1.00 pack-year smoking history. She has never used smokeless tobacco. - Review of Systems: Per HPI. - Past Medical History: Patient Active Problem List   Diagnosis Date Noted  . Endometrial thickening on ultrasound 12/15/2017  . Retained products of conception after delivery with complications 09/22/2017   - Medications: reviewed and updated   Objective:   Physical Exam BP 120/79   Pulse 80   Wt 167 lb 12.8 oz (76.1 kg)   LMP 03/11/2018   Breastfeeding No   BMI 25.14 kg/m  Gen: NAD, alert, cooperative with exam, well-appearing Psych: good insight, alert and oriented GU/GYN: Self swab collected.   Assessment & Plan:   1. Vaginal discharge - Cervicovaginal ancillary only( [place])  2. Encounter for Depo-Provera contraception Upreg negative. Counseled on potential side effects with DepoProvera  injection.  - Pregnancy, urine POC - medroxyPROGESTERone (DEPO-PROVERA) injection 150 mg    Routine preventative health maintenance measures emphasized. Please refer to After Visit Summary for other counseling recommendations.   Return in about 3 months (around 06/17/2018) for Depo injection .  Marcy Siren, D.O. OB Fellow  03/19/2018, 4:39 PM

## 2018-03-19 NOTE — Patient Instructions (Signed)
Contraceptive Injection A contraceptive injection is a shot that prevents pregnancy. It is also called the birth control shot. The shot contains the hormone progestin, which prevents pregnancy by:  Stopping the ovaries from releasing eggs.  Thickening cervical mucus to prevent sperm from entering the cervix.  Thinning the lining of the uterus to prevent a fertilized egg from attaching to the uterus. Contraceptive injections are given under the skin (subcutaneous) or into a muscle (intramuscular). For these shots to work, you must get one of them every 3 months (12 weeks) from a health care provider. Tell a health care provider about:  Any allergies you have.  All medicines you are taking, including vitamins, herbs, eye drops, creams, and over-the-counter medicines.  Any blood disorders you have.  Any medical conditions you have.  Whether you are pregnant or may be pregnant. What are the risks? Generally, this is a safe procedure. However, problems may occur, including:  Mood changes or depression.  Loss of bone density (osteoporosis) after long-term use.  Blood clots.  Higher risk of an egg being fertilized outside your uterus (ectopic pregnancy).This is rare. What happens before the procedure?  Your health care provider may do a routine physical exam.  You may have a test to make sure you are not pregnant. What happens during the procedure?  The area where the shot will be given will be cleaned and sanitized with alcohol.  A needle will be inserted into a muscle in your upper arm or buttock, or into the skin of your thigh or abdomen. The needle will be attached to a syringe with the medicine inside of it.  The medicine will be pushed through the syringe and injected into your body.  A small bandage (dressing) may be placed over the injection site. What can I expect after the procedure?  After the procedure, it is common to have: ? Soreness around the injection site for  a couple of days. ? Irregular menstrual bleeding. ? Weight gain. ? Breast tenderness. ? Headaches. ? Discomfort in your abdomen.  Ask your health care provider whether you need to use an added method of birth control (backup contraception), such as a condom, sponge, or spermicide. ? If the first shot is given 1-7 days after the start of your last period, you will not need backup contraception. ? If the first shot is given at any other time during your menstrual cycle, you should avoid having sex or you will need backup contraception for 7 days after you receive the shot. Follow these instructions at home: General instructions   Take over-the-counter and prescription medicines only as told by your health care provider.  Do not massage the injection site.  Track your menstrual periods so you will know if they become irregular.  Always use a condom to protect against STIs (sexually transmitted infections).  Make sure you schedule an appointment in time for your next shot, and mark it on your calendar. For the birth control to prevent pregnancy, you must get the injections every 3 months (12 weeks). Lifestyle  Do not use any products that contain nicotine or tobacco, such as cigarettes and e-cigarettes. If you need help quitting, ask your health care provider.  Eat foods that are high in calcium and vitamin D, such as milk, cheese, and salmon. Doing this may help with any loss in bone density that is caused by the contraceptive injection. Ask your health care provider for dietary recommendations. Contact a health care provider if:  You  have nausea or vomiting.  You have abnormal vaginal discharge or bleeding.  You miss a period or you think you might be pregnant.  You experience mood changes or depression.  You feel dizzy or light-headed.  You have leg pain. Get help right away if:  You have chest pain.  You cough up blood.  You have shortness of breath.  You have a  severe headache that does not go away.  You have numbness in any part of your body.  You have slurred speech.  You have vision problems.  You have vaginal bleeding that is abnormally heavy or does not stop.  You have severe pain in your abdomen.  You have depression that does not get better with treatment. If you ever feel like you may hurt yourself or others, or have thoughts about taking your own life, get help right away. You can go to your nearest emergency department or call:  Your local emergency services (911 in the [place]  A suicide crisis helpline, such as the National Suicide Prevention Lifeline at 8188503855. This is open 24 hours a day. Summary  A contraceptive injection is a shot that prevents pregnancy. It is also called the birth control shot.  The shot is given under the skin (subcutaneous) or into a muscle (intramuscular).  After this procedure, it is common to have soreness around the injection site for a couple of days.  To prevent pregnancy, the shot must be given by a health care provider every 3 months (12 weeks).  After you have the shot, ask your health care provider whether you need to use an added method of birth control (backup contraception), such as a condom, sponge, or spermicide. This information is not intended to replace advice given to you by your health care provider. Make sure you discuss any questions you have with your health care provider. Document Released: 09/12/2016 Document Revised: 09/12/2016 Document Reviewed: 09/12/2016 Elsevier Interactive Patient Education  2019 [employer].   Safe Sex Practicing safe sex means taking steps before and during sex to reduce your risk of:  Getting an STD (sexually transmitted disease).  Giving your partner an STD.  Unwanted pregnancy. How can I practice safe sex? To practice safe sex:  Limit your sexual partners to only one partner who is having sex with only you.  Avoid using alcohol  and recreational drugs before having sex. These substances can affect your judgment.  Before having sex with a new partner: ? Talk to your partner about past partners, past STDs, and drug use. ? You and your partner should be screened for STDs and discuss the results with each other.  Check your body regularly for sores, blisters, rashes, or unusual discharge. If you notice any of these problems, visit your health care provider.  If you have symptoms of an infection or you are being treated for an STD, avoid sexual contact.  While having sex, use a condom. Make sure to: ? Use a condom every time you have vaginal, oral, or anal sex. Both females and males should wear condoms during oral sex. ? Keep condoms in place from the beginning to the end of sexual activity. ? Use a latex condom, if possible. Latex condoms offer the best protection. ? Use only water-based lubricants or oils to lubricate a condom. Using petroleum-based lubricants or oils will weaken the condom and increase the chance that it will break.  See your health care provider for regular screenings, exams, and tests for STDs.  Talk with your health care provider about the form of birth control (contraception) that is best for you.  Get vaccinated against hepatitis B and human papillomavirus (HPV).  If you are at risk of being infected with HIV (human immunodeficiency virus), talk with your health care provider about taking a prescription medicine to prevent HIV infection. You are considered at risk for HIV if: ? You are a man who has sex with other men. ? You are a heterosexual man or woman who is sexually active with more than one partner. ? You take drugs by injection. ? You are sexually active with a partner who has HIV. This information is not intended to replace advice given to you by your health care provider. Make sure you discuss any questions you have with your health care provider. Document Released: 02/25/2004  Document Revised: 06/03/2015 Document Reviewed: 12/07/2014 Elsevier Interactive Patient Education  2019 [employer].

## 2018-03-19 NOTE — Progress Notes (Signed)
VAginal d/c for 2-3 days. WHite d/c that is more than normal and has different odor than usual. No itching. Has been sexually active so would like sti testing. Also wants to discuss birth control options. Currently on pill but sometimes forgets to take them

## 2018-03-20 LAB — CERVICOVAGINAL ANCILLARY ONLY
BACTERIAL VAGINITIS: POSITIVE — AB
CANDIDA VAGINITIS: NEGATIVE
CHLAMYDIA, DNA PROBE: NEGATIVE
Neisseria Gonorrhea: NEGATIVE
Trichomonas: NEGATIVE

## 2018-03-22 ENCOUNTER — Other Ambulatory Visit: Payer: Self-pay | Admitting: Internal Medicine

## 2018-03-22 MED ORDER — METRONIDAZOLE 500 MG PO TABS: 500.0000 mg | ORAL_TABLET | Freq: Two times a day (BID) | ORAL | 0 refills | Status: AC

## 2018-03-22 NOTE — Progress Notes (Signed)
Flagyl for BV ordered.   Marcy Siren, D.O. [facility] Family Medicine Fellow, [facility] for [facility], [facility] 03/22/2018, 10:01 PM

## 2018-05-04 ENCOUNTER — Telehealth: Payer: Self-pay | Admitting: Obstetrics and Gynecology

## 2018-05-04 NOTE — Telephone Encounter (Signed)
The patient called in about some concerns. Stated when she gave birth to her child retained products were left in her. She stated she passed them on her own and everything was great until last night she woke up in excessive pain and passed what looks to be a blood clot. Would like to talk to the nurse about the issue. Placing on the wait list to schedule in appointment in May.

## 2018-05-07 NOTE — Telephone Encounter (Signed)
Returned pt's call.  Pt states concern about possible retained placenta.  Pt reports that she woke up in excruciating abdominal pain, passed clot the size of her pad, some was a clot and the rest was different.  Pt reports she has continued to have heavy vaginal bleeding and abdominal pain but has not passed any more blood clots.  Advised pt that she should go to the ED because an ultrasound may need to be performed to ascertain why she is having this heavy bleeding.  Pt verbalized understanding.

## 2018-06-18 ENCOUNTER — Ambulatory Visit (INDEPENDENT_AMBULATORY_CARE_PROVIDER_SITE_OTHER): Payer: Medicaid Other

## 2018-06-18 ENCOUNTER — Other Ambulatory Visit: Payer: Self-pay

## 2018-06-18 DIAGNOSIS — Z3042 Encounter for surveillance of injectable contraceptive: Secondary | ICD-10-CM

## 2018-06-18 MED ORDER — MEDROXYPROGESTERONE ACETATE 150 MG/ML IM SUSP
150.0000 mg | Freq: Once | INTRAMUSCULAR | Status: AC
  Administered 2018-06-18: 150 mg via INTRAMUSCULAR

## 2018-06-18 NOTE — Progress Notes (Signed)
[name] here for Depo-Provera  Injection.  Injection administered without complication. Patient will return in 3 months for next injection.  Darsi Ferrucci, RN 06/18/2018  3:23 PM

## 2018-06-18 NOTE — Progress Notes (Signed)
Agree with A & P. 

## 2018-08-24 DIAGNOSIS — H5213 Myopia, bilateral: Secondary | ICD-10-CM | POA: Diagnosis not present

## 2018-08-24 DIAGNOSIS — H52223 Regular astigmatism, bilateral: Secondary | ICD-10-CM | POA: Diagnosis not present

## 2018-08-25 DIAGNOSIS — H5213 Myopia, bilateral: Secondary | ICD-10-CM | POA: Diagnosis not present

## 2018-09-03 ENCOUNTER — Ambulatory Visit: Payer: Medicaid Other

## 2018-09-03 DIAGNOSIS — H52221 Regular astigmatism, right eye: Secondary | ICD-10-CM | POA: Diagnosis not present

## 2018-09-03 DIAGNOSIS — H5213 Myopia, bilateral: Secondary | ICD-10-CM | POA: Diagnosis not present

## 2018-09-06 ENCOUNTER — Ambulatory Visit: Payer: Medicaid Other

## 2018-09-11 ENCOUNTER — Other Ambulatory Visit: Payer: Self-pay

## 2018-09-11 ENCOUNTER — Ambulatory Visit (INDEPENDENT_AMBULATORY_CARE_PROVIDER_SITE_OTHER): Payer: Medicaid Other

## 2018-09-11 ENCOUNTER — Other Ambulatory Visit (HOSPITAL_COMMUNITY)
Admission: RE | Admit: 2018-09-11 | Discharge: 2018-09-11 | Disposition: A | Discharge status: Home / Self Care | Payer: Medicaid Other | Source: Ambulatory Visit | Attending: Advanced Practice Midwife | Admitting: Advanced Practice Midwife

## 2018-09-11 VITALS — BP 142/84 | HR 83 | Wt 184.1 lb

## 2018-09-11 DIAGNOSIS — Z3042 Encounter for surveillance of injectable contraceptive: Secondary | ICD-10-CM | POA: Diagnosis not present

## 2018-09-11 DIAGNOSIS — R030 Elevated blood-pressure reading, without diagnosis of hypertension: Secondary | ICD-10-CM

## 2018-09-11 MED ORDER — MEDROXYPROGESTERONE ACETATE 150 MG/ML IM SUSP
150.0000 mg | Freq: Once | INTRAMUSCULAR | Status: AC
  Administered 2018-09-11: 150 mg via INTRAMUSCULAR

## 2018-09-11 NOTE — Progress Notes (Signed)
Lorin Glass here for Depo-Provera  Injection.  Injection administered without complication. Patient will return in 3 months for next injection.  Due to pt's elevated BP pt given information to [facility] to get provided with primary care services.    Verdell Carmine, RN 09/11/2018  8:30 AM

## 2018-09-13 LAB — CERVICOVAGINAL ANCILLARY ONLY
Bacterial vaginitis: NEGATIVE
Candida vaginitis: POSITIVE — AB
Chlamydia: NEGATIVE
Neisseria Gonorrhea: NEGATIVE
Trichomonas: NEGATIVE

## 2018-09-16 ENCOUNTER — Other Ambulatory Visit: Payer: Self-pay | Admitting: Advanced Practice Midwife

## 2018-09-16 DIAGNOSIS — B3731 Acute candidiasis of vulva and vagina: Secondary | ICD-10-CM

## 2018-09-16 DIAGNOSIS — B373 Candidiasis of vulva and vagina: Secondary | ICD-10-CM

## 2018-09-16 MED ORDER — FLUCONAZOLE 150 MG PO TABS: 150.0000 mg | ORAL_TABLET | Freq: Once | ORAL | 1 refills | Status: AC

## 2018-09-16 NOTE — Progress Notes (Signed)
I reviewed the note and agree with the nursing assessment and plan.   Shirley Decamp, CNM 04/28/2017 10:32 AM   

## 2018-09-16 NOTE — Progress Notes (Signed)
DX vaginal yeast infection.  Rx Diflucan.

## 2018-09-27 ENCOUNTER — Encounter (INDEPENDENT_AMBULATORY_CARE_PROVIDER_SITE_OTHER): Payer: Self-pay | Admitting: Primary Care

## 2018-10-09 ENCOUNTER — Telehealth (INDEPENDENT_AMBULATORY_CARE_PROVIDER_SITE_OTHER): Payer: Medicaid Other | Admitting: Primary Care

## 2018-10-09 ENCOUNTER — Encounter (INDEPENDENT_AMBULATORY_CARE_PROVIDER_SITE_OTHER): Payer: Self-pay | Admitting: Primary Care

## 2018-10-09 ENCOUNTER — Other Ambulatory Visit: Payer: Self-pay

## 2018-10-09 DIAGNOSIS — D649 Anemia, unspecified: Secondary | ICD-10-CM | POA: Diagnosis not present

## 2018-10-09 DIAGNOSIS — J301 Allergic rhinitis due to pollen: Secondary | ICD-10-CM

## 2018-10-09 DIAGNOSIS — Z7689 Persons encountering health services in other specified circumstances: Secondary | ICD-10-CM

## 2018-10-09 NOTE — Progress Notes (Signed)
Virtual Visit via Telephone Note  I connected with Sherry Leach on 10/09/18 at 11:10 AM EDT by telephone and verified that I am speaking with the correct person using two identifiers.   I discussed the limitations, risks, security and privacy concerns of performing an evaluation and management service by telephone and the availability of in person appointments. I also discussed with the patient that there may be a patient responsible charge related to this service. The patient expressed understanding and agreed to proceed.  WEB VISIT History of Present Illness: Sherry Leach is establishing care with a PCP. Previously followed by ObGYN.  She voices no problems or concerns at this time. She is on depo for contraception last given 09/11/2018 by GYN. Past Medical History:  Diagnosis Date  . Anemia   . Seasonal allergies   . SVD (spontaneous vaginal delivery) 09/14/2017   x 1     Observations/Objective: Review of Systems  All other systems reviewed and are negative.   Assessment and Plan: Sherry Leach was seen today for new patient (initial visit).  Diagnoses and all orders for this visit:  Encounter to establish care Juluis Mire, NP-C will be your  (PCP) that will  provides both the first contact for a person with an undiagnosed health concern as well as continuing care of varied medical conditions, not limited by cause, organ system, or diagnosis.  -     CBC with Differential/Platelet; Future -     CMP14+EGFR; Future  Anemia, unspecified type Anemia is a condition in which you lack enough healthy red blood cells to carry adequate oxygen to your body's tissues. Having anemia can make you feel tired and weak. Anemia can be temporary or long term, and it can range from mild to severe -     CBC with Differential/Platelet; Future  Seasonal allergic rhinitis due to pollen  Use Flonase nasal spray for at least duration of your allergy season.  - For appropriate administration of the nasal  spray, clear the nose, use opposite hand for opposite nare, sniff gently, exhale through your mouth. - For maximal effect take these two nasal sprays at least 30 minutes apart. - Continue Allegra, Claritin or Zyrtec each day, as needed. - Drink at least 64 ounces of water each day. - If you have a humidifier use it nightly. - Remove as many irritants/allergies as you are able to, no pets in the bedroom, change air filters in air vents.  Follow Up Instructions:    I discussed the assessment and treatment plan with the patient. The patient was provided an opportunity to ask questions and all were answered. The patient agreed with the plan and demonstrated an understanding of the instructions.   The patient was advised to call back or seek an in-person evaluation if the symptoms worsen or if the condition fails to improve as anticipated.  I provided 15 minutes of face-to-face time during this encounter.   Kerin Perna, NP

## 2018-11-19 ENCOUNTER — Emergency Department (HOSPITAL_COMMUNITY): Payer: Medicaid Other

## 2018-11-19 ENCOUNTER — Encounter (HOSPITAL_COMMUNITY): Payer: Self-pay | Admitting: Emergency Medicine

## 2018-11-19 ENCOUNTER — Telehealth (HOSPITAL_COMMUNITY): Payer: Self-pay | Admitting: Physician Assistant

## 2018-11-19 ENCOUNTER — Emergency Department (HOSPITAL_COMMUNITY)
Admission: EM | Admit: 2018-11-19 | Discharge: 2018-11-19 | Disposition: A | Discharge status: Home / Self Care | Payer: Medicaid Other | Attending: Emergency Medicine | Admitting: Emergency Medicine

## 2018-11-19 ENCOUNTER — Other Ambulatory Visit: Payer: Self-pay

## 2018-11-19 DIAGNOSIS — Y999 Unspecified external cause status: Secondary | ICD-10-CM | POA: Diagnosis not present

## 2018-11-19 DIAGNOSIS — S52125A Nondisplaced fracture of head of left radius, initial encounter for closed fracture: Secondary | ICD-10-CM | POA: Insufficient documentation

## 2018-11-19 DIAGNOSIS — S0083XA Contusion of other part of head, initial encounter: Secondary | ICD-10-CM | POA: Diagnosis not present

## 2018-11-19 DIAGNOSIS — S0512XA Contusion of eyeball and orbital tissues, left eye, initial encounter: Secondary | ICD-10-CM | POA: Insufficient documentation

## 2018-11-19 DIAGNOSIS — S0012XA Contusion of left eyelid and periocular area, initial encounter: Secondary | ICD-10-CM | POA: Diagnosis not present

## 2018-11-19 DIAGNOSIS — S59902A Unspecified injury of left elbow, initial encounter: Secondary | ICD-10-CM | POA: Diagnosis not present

## 2018-11-19 DIAGNOSIS — M25422 Effusion, left elbow: Secondary | ICD-10-CM | POA: Diagnosis not present

## 2018-11-19 DIAGNOSIS — F17210 Nicotine dependence, cigarettes, uncomplicated: Secondary | ICD-10-CM | POA: Diagnosis not present

## 2018-11-19 DIAGNOSIS — Y9301 Activity, walking, marching and hiking: Secondary | ICD-10-CM | POA: Diagnosis not present

## 2018-11-19 DIAGNOSIS — Y92480 Sidewalk as the place of occurrence of the external cause: Secondary | ICD-10-CM | POA: Diagnosis not present

## 2018-11-19 DIAGNOSIS — S0990XA Unspecified injury of head, initial encounter: Secondary | ICD-10-CM | POA: Diagnosis present

## 2018-11-19 DIAGNOSIS — M25522 Pain in left elbow: Secondary | ICD-10-CM | POA: Diagnosis not present

## 2018-11-19 DIAGNOSIS — S4992XA Unspecified injury of left shoulder and upper arm, initial encounter: Secondary | ICD-10-CM | POA: Diagnosis not present

## 2018-11-19 MED ORDER — OXYCODONE-ACETAMINOPHEN 5-325 MG PO TABS: 1.0000 | ORAL_TABLET | ORAL | 0 refills | Status: DC | PRN

## 2018-11-19 MED ORDER — OXYCODONE-ACETAMINOPHEN 5-325 MG PO TABS: 2.0000 | ORAL_TABLET | ORAL | 0 refills | Status: AC | PRN

## 2018-11-19 MED ORDER — OXYCODONE-ACETAMINOPHEN 5-325 MG PO TABS
2.0000 | ORAL_TABLET | Freq: Once | ORAL | Status: AC
  Administered 2018-11-19: 02:00:00 2 via ORAL
  Filled 2018-11-19: qty 2

## 2018-11-19 NOTE — Discharge Instructions (Signed)
Take the prescribed medication as directed.  Do not drive while taking pain medication.  Can use over the counter motrin with this between doses if need, up to 800mg  3x daily. Follow-up with Dr. Veverly Fells with orthopedics-- call his office to make appointment. Return to the ED for new or worsening symptoms.

## 2018-11-19 NOTE — ED Notes (Signed)
Patient ambulated to X-ray 

## 2018-11-19 NOTE — Progress Notes (Signed)
Orthopedic Tech Progress Note Patient Details:  Sherry Leach May 29, 1995 [id number]  Ortho Devices Type of Ortho Device: Arm sling, Post (long arm) splint Ortho Device/Splint Location: lue Ortho Device/Splint Interventions: Ordered, Application, Adjustment   Post Interventions Patient Tolerated: Well Instructions Provided: Care of device, Adjustment of device   Sherry Leach 11/19/2018, 2:58 AM

## 2018-11-19 NOTE — Telephone Encounter (Signed)
Patient narcotic prescription was printed instead or sent electronically. I have sent this prescription electronically to her pharmacy.  Janeece Fitting PA-C
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
## 2018-11-20 ENCOUNTER — Telehealth (INDEPENDENT_AMBULATORY_CARE_PROVIDER_SITE_OTHER): Payer: Medicaid Other | Admitting: Primary Care

## 2018-11-20 DIAGNOSIS — M25522 Pain in left elbow: Secondary | ICD-10-CM | POA: Diagnosis not present

## 2018-11-27 ENCOUNTER — Ambulatory Visit: Payer: Medicaid Other

## 2018-11-27 ENCOUNTER — Other Ambulatory Visit: Payer: Self-pay

## 2018-12-04 ENCOUNTER — Other Ambulatory Visit: Payer: Self-pay

## 2018-12-04 ENCOUNTER — Ambulatory Visit (INDEPENDENT_AMBULATORY_CARE_PROVIDER_SITE_OTHER): Payer: Medicaid Other | Admitting: Primary Care

## 2018-12-04 ENCOUNTER — Encounter (INDEPENDENT_AMBULATORY_CARE_PROVIDER_SITE_OTHER): Payer: Self-pay | Admitting: Primary Care

## 2018-12-04 DIAGNOSIS — R030 Elevated blood-pressure reading, without diagnosis of hypertension: Secondary | ICD-10-CM | POA: Diagnosis not present

## 2018-12-04 DIAGNOSIS — D649 Anemia, unspecified: Secondary | ICD-10-CM

## 2018-12-04 DIAGNOSIS — S52122A Displaced fracture of head of left radius, initial encounter for closed fracture: Secondary | ICD-10-CM | POA: Diagnosis not present

## 2018-12-04 DIAGNOSIS — M25522 Pain in left elbow: Secondary | ICD-10-CM | POA: Diagnosis not present

## 2018-12-04 NOTE — Progress Notes (Signed)
BP at 2:05 pm 122/83 right arm pulse 111

## 2018-12-04 NOTE — Progress Notes (Signed)
Virtual Visit via Telephone Note  I connected with Sherry Leach on 12/04/18 at  2:10 PM EST by telephone and verified that I am speaking with the correct person using two identifiers.   I discussed the limitations, risks, security and privacy concerns of performing an evaluation and management service by telephone and the availability of in person appointments. I also discussed with the patient that there may be a patient responsible charge related to this service. The patient expressed understanding and agreed to proceed.   History of Present Illness: [name] is having a tele visit for Bp follow today her reading is 122/83 with heart rate 11. When she takes her blood pressure it has been wnl. She denies, headaches, chest pain or lower extremity edema. She admits to shortness of breath with excerption. She was previously on depo and menstrual cycle just stopped after 5 months. Labs have been order to rule out anemia from acute blood loss.  Past Medical History:  Diagnosis Date  . Anemia   . Seasonal allergies   . SVD (spontaneous vaginal delivery) 09/14/2017   x 1   Observations/Objective: Review of Systems  Constitutional: Positive for malaise/fatigue.  Endo/Heme/Allergies:       Menorrhalgia   All other systems reviewed and are negative.  Assessment and Plan: Sherry Leach was seen today for blood pressure check.  Diagnoses and all orders for this visit:  Anemia, unspecified type History of and has had a menstrual cycle for 5 months. Future CBC ordered to determine if shortness of breath and fatigue is related to anemia.   Elevated blood pressure reading Self limiting Blood pressure readings at home have been wnl.    Follow Up Instructions:    I discussed the assessment and treatment plan with the patient. The patient was provided an opportunity to ask questions and all were answered. The patient agreed with the plan and demonstrated an understanding of the instructions.    The patient was advised to call back or seek an in-person evaluation if the symptoms worsen or if the condition fails to improve as anticipated.  I provided 13 minutes of non-face-to-face time during this encounter.   Kerin Perna, NP

## 2018-12-12 DIAGNOSIS — M25522 Pain in left elbow: Secondary | ICD-10-CM | POA: Diagnosis not present

## 2018-12-21 ENCOUNTER — Telehealth (INDEPENDENT_AMBULATORY_CARE_PROVIDER_SITE_OTHER): Payer: Medicaid Other

## 2018-12-21 DIAGNOSIS — N939 Abnormal uterine and vaginal bleeding, unspecified: Secondary | ICD-10-CM

## 2018-12-21 MED ORDER — TRANEXAMIC ACID 650 MG PO TABS: 1300.0000 mg | ORAL_TABLET | Freq: Three times a day (TID) | ORAL | 0 refills | Status: DC

## 2018-12-21 NOTE — Telephone Encounter (Signed)
Called pt in response to MyChart message. Pt reports period like bleeding for 6 months while on Depo Provera. Pt did not receive scheduled Depo Provera in October 2020 because she was concerned it was causing the bleeding. Since that time pt has continued to have period like bleeding. 4 days ago pt began experiencing heavier bleeding, saturating 1 pad/hour during waking hours.   Reviewed with Dr. Ilda Basset who gives verbal order for Lysteda 1300 mg TID for 5 days and recommends pt come to after hours clinic on Monday. Pt notified and rx sent to preferred pharmacy. Pt verbalized understanding that she needs to pick up medication today, take as prescribed, and come to after hours clinic on Monday.

## 2018-12-24 ENCOUNTER — Encounter: Payer: Self-pay | Admitting: Family Medicine

## 2018-12-24 ENCOUNTER — Ambulatory Visit (INDEPENDENT_AMBULATORY_CARE_PROVIDER_SITE_OTHER): Payer: Medicaid Other | Admitting: Family Medicine

## 2018-12-24 ENCOUNTER — Other Ambulatory Visit: Payer: Self-pay

## 2018-12-24 ENCOUNTER — Other Ambulatory Visit (HOSPITAL_COMMUNITY)
Admission: RE | Admit: 2018-12-24 | Discharge: 2018-12-24 | Disposition: A | Discharge status: Home / Self Care | Payer: Medicaid Other | Source: Ambulatory Visit | Attending: Family Medicine | Admitting: Family Medicine

## 2018-12-24 VITALS — BP 137/95 | HR 93 | Wt 181.2 lb

## 2018-12-24 DIAGNOSIS — Z3009 Encounter for other general counseling and advice on contraception: Secondary | ICD-10-CM

## 2018-12-24 DIAGNOSIS — Z113 Encounter for screening for infections with a predominantly sexual mode of transmission: Secondary | ICD-10-CM | POA: Diagnosis not present

## 2018-12-24 LAB — POCT PREGNANCY, URINE: Preg Test, Ur: NEGATIVE

## 2018-12-24 MED ORDER — NORGESTIMATE-ETH ESTRADIOL 0.25-35 MG-MCG PO TABS: 1.0000 | ORAL_TABLET | Freq: Every day | ORAL | 11 refills | Status: DC

## 2018-12-24 NOTE — Progress Notes (Signed)
GYNECOLOGY OFFICE VISIT NOTE  History:   Sherry Leach is a 23 y.o. G1P1001 here today for contraception counseling.  Previously on Depo but skipped October shot Has been having irregular bleeding since Prior to Depo had used OCP's as a teenager and they worked well Prior to Depo had regular periods, normal flow amount Would like to restart pills Last unprotected sex two weeks prior Patient's last menstrual period was 11/01/2018 (exact date). Last Depo shot was 09/11/2018 UPT negative  Would also like STI screening  Past Medical History:  Diagnosis Date  . Anemia   . Seasonal allergies   . SVD (spontaneous vaginal delivery) 09/14/2017   x 1    Past Surgical History:  Procedure Laterality Date  . DILATION AND EVACUATION N/A 09/22/2017   Procedure: DILATATION AND EVACUATION;  Surgeon: Aletha Halim, MD;  Location: [facility];  Service: Gynecology;  Laterality: N/A;  . HYSTEROSCOPY W/D&C N/A 12/19/2017   Procedure: DILATATION AND CURETTAGE /DIAGNOSTIC HYSTEROSCOPY;  Surgeon: Osborne Oman, MD;  Location: [facility];  Service: Gynecology;  Laterality: N/A;  rep will be here confirmed on 12/18/17  . MOUTH SURGERY     teeth ext  . WISDOM TOOTH EXTRACTION      The following portions of the patient's history were reviewed and updated as appropriate: allergies, current medications, past family history, past medical history, past social history, past surgical history and problem list.   Health Maintenance:  Normal pap 10/23/2017.   Review of Systems:  Pertinent items noted in HPI and remainder of comprehensive ROS otherwise negative.  Physical Exam:  BP (!) 137/95   Pulse 93   Wt 181 lb 3.2 oz (82.2 kg)   LMP 11/01/2018 (Exact Date)   BMI 27.15 kg/m  CONSTITUTIONAL: Well-developed, well-nourished female in no acute distress.  HEENT:  Normocephalic, atraumatic. External right and left ear normal. No scleral icterus.  NECK: Normal range of motion, supple, no masses noted on  observation SKIN: No rash noted. Not diaphoretic. No erythema. No pallor. MUSCULOSKELETAL: Normal range of motion. No edema noted. NEUROLOGIC: Alert and oriented to person, place, and time. Normal muscle tone coordination.  PSYCHIATRIC: Normal mood and affect. Normal behavior. Normal judgment and thought content. RESPIRATORY: Effort normal, no problems with respiration noted  PELVIC: Deferred  Labs and Imaging Results for orders placed or performed in visit on 12/24/18 (from the past 168 hour(s))  Pregnancy, urine POC   Collection Time: 12/24/18  6:49 PM  Result Value Ref Range   Preg Test, Ur NEGATIVE NEGATIVE   No results found.    Assessment and Plan:   #Contraception Rx for OCP's given with quickstart method, RTC in 2 weeks for UPT given LMP unreliable in setting of depo and last unprotected sex 2 weeks prior.   #STI screening GC/CT/trich today, results by mychart  #HCM UTD on pap  Problem List Items Addressed This Visit    None    Visit Diagnoses    Screening for STD (sexually transmitted disease)    -  Primary   Relevant Orders   Cervicovaginal ancillary only( [facility])   Encounter for counseling regarding contraception       Relevant Medications   norgestimate-ethinyl estradiol (ORTHO-CYCLEN) 0.25-35 MG-MCG tablet      Routine preventative health maintenance measures emphasized. Please refer to After Visit Summary for other counseling recommendations.   No follow-ups on file.    Total face-to-face time with patient: 15 minutes.  Over 50% of encounter was spent on  counseling and coordination of care.   Zack Seal, MD/MP[facility] [employer], [facility]

## 2018-12-26 LAB — CERVICOVAGINAL ANCILLARY ONLY
Chlamydia: NEGATIVE
Comment: NEGATIVE
Comment: NEGATIVE
Comment: NORMAL
Neisseria Gonorrhea: NEGATIVE
Trichomonas: NEGATIVE

## 2019-01-08 ENCOUNTER — Other Ambulatory Visit: Payer: Self-pay

## 2019-01-08 ENCOUNTER — Ambulatory Visit (INDEPENDENT_AMBULATORY_CARE_PROVIDER_SITE_OTHER): Payer: Medicaid Other

## 2019-01-08 DIAGNOSIS — Z3202 Encounter for pregnancy test, result negative: Secondary | ICD-10-CM

## 2019-01-08 DIAGNOSIS — Z3200 Encounter for pregnancy test, result unknown: Secondary | ICD-10-CM

## 2019-01-08 DIAGNOSIS — M25522 Pain in left elbow: Secondary | ICD-10-CM | POA: Diagnosis not present

## 2019-01-08 LAB — POCT PREGNANCY, URINE: Preg Test, Ur: NEGATIVE

## 2019-01-08 NOTE — Progress Notes (Signed)
Pt here for UPT; test resulted negative. Pt notified of result. No other concerns at this time.   Apolonio Schneiders RN 01/08/19

## 2019-01-09 NOTE — Progress Notes (Signed)
Chart reviewed for nurse visit. Agree with plan of care.   Jorje Guild, NP 01/09/2019 8:01 AM

## 2019-02-13 ENCOUNTER — Ambulatory Visit: Payer: Medicaid Other

## 2019-03-27 ENCOUNTER — Ambulatory Visit: Payer: Medicaid Other

## 2019-04-04 ENCOUNTER — Ambulatory Visit: Payer: Medicaid Other

## 2019-05-30 ENCOUNTER — Emergency Department (HOSPITAL_COMMUNITY)
Admission: EM | Admit: 2019-05-30 | Discharge: 2019-05-31 | Disposition: A | Discharge status: Home / Self Care | Payer: Medicaid Other | Attending: Emergency Medicine | Admitting: Emergency Medicine

## 2019-05-30 ENCOUNTER — Other Ambulatory Visit: Payer: Self-pay

## 2019-05-30 DIAGNOSIS — R05 Cough: Secondary | ICD-10-CM | POA: Insufficient documentation

## 2019-05-30 DIAGNOSIS — Z20822 Contact with and (suspected) exposure to covid-19: Secondary | ICD-10-CM | POA: Insufficient documentation

## 2019-05-30 DIAGNOSIS — J029 Acute pharyngitis, unspecified: Secondary | ICD-10-CM | POA: Diagnosis not present

## 2019-05-30 DIAGNOSIS — R509 Fever, unspecified: Secondary | ICD-10-CM | POA: Insufficient documentation

## 2019-05-30 DIAGNOSIS — M7918 Myalgia, other site: Secondary | ICD-10-CM | POA: Insufficient documentation

## 2019-05-30 DIAGNOSIS — R0602 Shortness of breath: Secondary | ICD-10-CM | POA: Diagnosis not present

## 2019-05-30 DIAGNOSIS — F17210 Nicotine dependence, cigarettes, uncomplicated: Secondary | ICD-10-CM | POA: Diagnosis not present

## 2019-05-30 NOTE — ED Triage Notes (Signed)
Pt c/o generalized body aches, and states a fever 101.4 at home and took tylenol at approx 2200. Requesting covid test.

## 2019-05-31 ENCOUNTER — Emergency Department (HOSPITAL_COMMUNITY): Payer: Medicaid Other

## 2019-05-31 ENCOUNTER — Other Ambulatory Visit: Payer: Self-pay

## 2019-05-31 ENCOUNTER — Encounter (HOSPITAL_COMMUNITY): Payer: Self-pay

## 2019-05-31 DIAGNOSIS — Z20822 Contact with and (suspected) exposure to covid-19: Secondary | ICD-10-CM | POA: Diagnosis not present

## 2019-05-31 DIAGNOSIS — R0602 Shortness of breath: Secondary | ICD-10-CM | POA: Diagnosis not present

## 2019-05-31 LAB — SARS CORONAVIRUS 2 (TAT 6-24 HRS): SARS Coronavirus 2: NEGATIVE

## 2019-05-31 LAB — GROUP A STREP BY PCR: Group A Strep by PCR: NOT DETECTED

## 2019-05-31 MED ORDER — ALBUTEROL SULFATE HFA 108 (90 BASE) MCG/ACT IN AERS
2.0000 | INHALATION_SPRAY | Freq: Once | RESPIRATORY_TRACT | Status: AC
  Administered 2019-05-31: 01:00:00 2 via RESPIRATORY_TRACT
  Filled 2019-05-31: qty 6.7

## 2019-05-31 NOTE — Discharge Instructions (Signed)
Keep your self quarantined while your Covid test is pending.  Use Tylenol or Motrin as needed for aches and fever at home.  You should quarantine for total of 14 days while you are still feeling ill.  Return to the ED with chest pain, shortness of breath, not able to eat or drink or other concerns.

## 2019-05-31 NOTE — ED Provider Notes (Signed)
[facility]   CSN: [id number] Arrival date & time: 05/30/19  2347     History Chief Complaint  Patient presents with  . Generalized Body Aches    Sherry Leach is a 24 y.o. female.  Patient with no chronic medical history presenting with a 1 day history of sore throat, body aches, headache, fever, nausea, vomiting and coughing with posttussive emesis.  States all symptoms started yesterday.  She woke up with a sore throat which progressed to aches all over today with fever up to 102.  Complaining of a headache as well.  Cough productive of green-yellow mucus with sinus congestion and runny nose.  Several episodes of posttussive emesis today.  Feels like she cannot breathe because of congestion in her nose.  Denies chest pain.  Denies abdominal pain or diarrhea.  Denies known Covid exposures or recent travel.  She took Tylenol at home without relief.  She denies any chest pain or abdominal pain currently.  Denies any possibility of pregnancy.  The history is provided by the patient.       Past Medical History:  Diagnosis Date  . Anemia   . Seasonal allergies   . SVD (spontaneous vaginal delivery) 09/14/2017   x 1    There are no problems to display for this patient.   Past Surgical History:  Procedure Laterality Date  . DILATION AND EVACUATION N/A 09/22/2017   Procedure: DILATATION AND EVACUATION;  Surgeon: Jasper Bing, MD;  Location: [facility];  Service: Gynecology;  Laterality: N/A;  . HYSTEROSCOPY WITH D & C N/A 12/19/2017   Procedure: DILATATION AND CURETTAGE /DIAGNOSTIC HYSTEROSCOPY;  Surgeon: Tereso Newcomer, MD;  Location: [facility];  Service: Gynecology;  Laterality: N/A;  rep will be here confirmed on 12/18/17  . MOUTH SURGERY     teeth ext  . WISDOM TOOTH EXTRACTION       OB History    Gravida  1   Para  1   Term  1   Preterm      AB      Living  1     SAB      TAB      Ectopic      Multiple  0     Live Births  1           Family History  Problem Relation Age of Onset  . Hypertension Mother   . Hypertension Other     Social History   Tobacco Use  . Smoking status: Current Some Day Smoker    Packs/day: 0.25    Years: 4.00    Pack years: 1.00    Types: Cigarettes  . Smokeless tobacco: Never Used  Substance Use Topics  . Alcohol use: Yes    Comment: occasionally  . Drug use: No    Home Medications Prior to Admission medications   Medication Sig Start Date End Date Taking? Authorizing Provider  norgestimate-ethinyl estradiol (ORTHO-CYCLEN) 0.25-35 MG-MCG tablet Take 1 tablet by mouth daily. 12/24/18   Venora Maples, MD  tranexamic acid (LYSTEDA) 650 MG TABS tablet Take 2 tablets (1,300 mg total) by mouth 3 (three) times daily. 12/21/18   [place] Bing, MD    Allergies    Amoxicillin and Penicillins  Review of Systems   Review of Systems  Constitutional: Positive for activity change, appetite change, chills, fatigue and fever.  HENT: Positive for congestion, rhinorrhea, sinus pain and sore throat. Negative for trouble swallowing.  Respiratory: Positive for cough and wheezing. Negative for chest tightness and shortness of breath.   Cardiovascular: Negative for chest pain.  Gastrointestinal: Positive for nausea and vomiting. Negative for abdominal pain.  Genitourinary: Negative for dysuria and hematuria.  Musculoskeletal: Positive for arthralgias and myalgias.  Skin: Negative for rash.  Neurological: Positive for weakness and headaches.   all other systems are negative except as noted in the HPI and PMH.    Physical Exam Updated Vital Signs BP 128/69 (BP Location: Left Arm)   Pulse 77   Temp 98.5 F (36.9 C) (Oral)   Resp 16   Ht 5\' 8"  (1.727 m)   Wt 81.6 kg   SpO2 99%   BMI 27.37 kg/m   Physical Exam Vitals and nursing note reviewed.  Constitutional:      General: She is not in acute distress.    Appearance: She is well-developed.      Comments: Nasal congestion  HENT:     Head: Normocephalic and atraumatic.     Mouth/Throat:     Pharynx: Posterior oropharyngeal erythema present. No oropharyngeal exudate.     Comments: Erythematous oropharynx.  There is no asymmetry.  Uvula is midline Eyes:     Conjunctiva/sclera: Conjunctivae normal.     Pupils: Pupils are equal, round, and reactive to light.  Neck:     Comments: No meningismus. Cardiovascular:     Rate and Rhythm: Normal rate and regular rhythm.     Heart sounds: Normal heart sounds. No murmur.  Pulmonary:     Effort: Pulmonary effort is normal. No respiratory distress.     Breath sounds: Wheezing present.  Abdominal:     Palpations: Abdomen is soft.     Tenderness: There is no abdominal tenderness. There is no guarding or rebound.  Musculoskeletal:        General: No tenderness. Normal range of motion.     Cervical back: Normal range of motion and neck supple.  Skin:    General: Skin is warm.     Capillary Refill: Capillary refill takes less than 2 seconds.  Neurological:     General: No focal deficit present.     Mental Status: She is alert and oriented to person, place, and time. Mental status is at baseline.     Cranial Nerves: No cranial nerve deficit.     Motor: No abnormal muscle tone.     Coordination: Coordination normal.     Comments: No ataxia on finger to nose bilaterally. No pronator drift. 5/5 strength throughout. CN 2-12 intact.Equal grip strength. Sensation intact.   Psychiatric:        Behavior: Behavior normal.     ED Results / Procedures / Treatments   Labs (all labs ordered are listed, but only abnormal results are displayed) Labs Reviewed  GROUP A STREP BY PCR  SARS CORONAVIRUS 2 (TAT 6-24 HRS)    EKG None  Radiology DG Chest Portable 1 View  Result Date: 05/31/2019 CLINICAL DATA:  Shortness of breath EXAM: PORTABLE CHEST 1 VIEW COMPARISON:  January 18, 2012 FINDINGS: The heart size and mediastinal contours are within  normal limits. Both lungs are clear. The visualized skeletal structures are unremarkable. IMPRESSION: No active disease. Electronically Signed   By: Prudencio Pair M.D.   On: 05/31/2019 01:00    Procedures Procedures (including critical care time)  Medications Ordered in ED Medications  albuterol (VENTOLIN HFA) 108 (90 Base) MCG/ACT inhaler 2 puff (has no administration in time range)  ED Course  I have reviewed the triage vital signs and the nursing notes.  Pertinent labs & imaging results that were available during my care of the patient were reviewed by me and considered in my medical decision making (see chart for details).    MDM Rules/Calculators/A&P                     2-day history of body aches, fever, cough, posttussive emesis with headache and sore throat.  Vitals are stable with no fever on arrival.  No meningismus.  Chest x-ray is negative.  Rapid strep is negative.  Patient with no increased work of breathing or hypoxia.  Suspect likely Covid infection versus other viral syndrome.   Instructed to keep her self quarantined while results are pending.  Use Tylenol or Motrin as needed for aches and fever.  Oral hydration at home.  Return to the ED with chest pain, shortness of breath, unable to eat or drink or other concerns.  SHAKENYA STONEBERG was evaluated in Emergency Department on 05/31/2019 for the symptoms described in the history of present illness. She was evaluated in the context of the global COVID-19 pandemic, which necessitated consideration that the patient might be at risk for infection with the SARS-CoV-2 virus that causes COVID-19. Institutional protocols and algorithms that pertain to the evaluation of patients at risk for COVID-19 are in a state of rapid change based on information released by regulatory bodies including the CDC and federal and state organizations. These policies and algorithms were followed during the patient's care in the ED.  Final Clinical  Impression(s) / ED Diagnoses Final diagnoses:  Suspected COVID-19 virus infection    Rx / DC Orders ED Discharge Orders    None       Cathi Hazan, Jeannett Senior, MD 05/31/19 [phone]

## 2019-08-04 ENCOUNTER — Other Ambulatory Visit: Payer: Self-pay

## 2019-08-04 ENCOUNTER — Emergency Department (HOSPITAL_COMMUNITY)
Admission: EM | Admit: 2019-08-04 | Discharge: 2019-08-05 | Disposition: A | Discharge status: Home / Self Care | Payer: Medicaid Other | Attending: Emergency Medicine | Admitting: Emergency Medicine

## 2019-08-04 ENCOUNTER — Encounter (HOSPITAL_COMMUNITY): Payer: Self-pay | Admitting: Emergency Medicine

## 2019-08-04 DIAGNOSIS — Y999 Unspecified external cause status: Secondary | ICD-10-CM | POA: Insufficient documentation

## 2019-08-04 DIAGNOSIS — F17210 Nicotine dependence, cigarettes, uncomplicated: Secondary | ICD-10-CM | POA: Insufficient documentation

## 2019-08-04 DIAGNOSIS — S51852A Open bite of left forearm, initial encounter: Secondary | ICD-10-CM | POA: Insufficient documentation

## 2019-08-04 DIAGNOSIS — Y92009 Unspecified place in unspecified non-institutional (private) residence as the place of occurrence of the external cause: Secondary | ICD-10-CM | POA: Insufficient documentation

## 2019-08-04 DIAGNOSIS — Y9389 Activity, other specified: Secondary | ICD-10-CM | POA: Diagnosis not present

## 2019-08-04 DIAGNOSIS — W503XXA Accidental bite by another person, initial encounter: Secondary | ICD-10-CM | POA: Insufficient documentation

## 2019-08-04 MED ORDER — METRONIDAZOLE 500 MG PO TABS: 500.0000 mg | ORAL_TABLET | Freq: Two times a day (BID) | ORAL | 0 refills | Status: DC

## 2019-08-04 MED ORDER — DOXYCYCLINE HYCLATE 100 MG PO TABS
100.0000 mg | ORAL_TABLET | Freq: Once | ORAL | Status: AC
  Administered 2019-08-04: 100 mg via ORAL
  Filled 2019-08-04: qty 1

## 2019-08-04 MED ORDER — METRONIDAZOLE 500 MG PO TABS
500.0000 mg | ORAL_TABLET | Freq: Once | ORAL | Status: AC
  Administered 2019-08-04: 500 mg via ORAL
  Filled 2019-08-04: qty 1

## 2019-08-04 MED ORDER — DOXYCYCLINE HYCLATE 100 MG PO CAPS: 100.0000 mg | ORAL_CAPSULE | Freq: Two times a day (BID) | ORAL | 0 refills | Status: DC
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
## 2019-08-04 NOTE — Discharge Instructions (Addendum)
Keep wound clean with soap and warm water. Take the prescribed medication as directed-- sent to pharmacy for you. Follow-up with your primary care doctor. Return to the ED for new or worsening symptoms.

## 2019-08-14 ENCOUNTER — Ambulatory Visit (INDEPENDENT_AMBULATORY_CARE_PROVIDER_SITE_OTHER): Payer: Self-pay
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
## 2019-08-20 ENCOUNTER — Other Ambulatory Visit: Payer: Self-pay

## 2019-08-20 ENCOUNTER — Ambulatory Visit (INDEPENDENT_AMBULATORY_CARE_PROVIDER_SITE_OTHER): Payer: Medicaid Other

## 2019-08-20 VITALS — BP 110/73 | HR 85 | Ht 68.5 in | Wt 154.9 lb

## 2019-08-20 DIAGNOSIS — Z3A01 Less than 8 weeks gestation of pregnancy: Secondary | ICD-10-CM

## 2019-08-20 DIAGNOSIS — Z3480 Encounter for supervision of other normal pregnancy, unspecified trimester: Secondary | ICD-10-CM | POA: Insufficient documentation

## 2019-08-20 DIAGNOSIS — O3680X0 Pregnancy with inconclusive fetal viability, not applicable or unspecified: Secondary | ICD-10-CM | POA: Diagnosis not present

## 2019-08-20 DIAGNOSIS — N912 Amenorrhea, unspecified: Secondary | ICD-10-CM

## 2019-08-20 DIAGNOSIS — Z3201 Encounter for pregnancy test, result positive: Secondary | ICD-10-CM

## 2019-08-20 DIAGNOSIS — Z3490 Encounter for supervision of normal pregnancy, unspecified, unspecified trimester: Secondary | ICD-10-CM

## 2019-08-20 DIAGNOSIS — Z3687 Encounter for antenatal screening for uncertain dates: Secondary | ICD-10-CM

## 2019-08-20 LAB — POCT URINE PREGNANCY: Preg Test, Ur: POSITIVE — AB

## 2019-08-20 NOTE — Progress Notes (Signed)
  PRENATAL INTAKE SUMMARY  Ms. Heckmann presents today New OB Nurse Interview.  OB History    Gravida  1   Para  1   Term  1   Preterm      AB      Living  1     SAB      TAB      Ectopic      Multiple  0   Live Births  1          I have reviewed the patient's medical, obstetrical, social, and family histories, medications, and available lab results.  SUBJECTIVE She has no unusual complaints  OBJECTIVE Initial Physical Exam (New OB)  GENERAL APPEARANCE: alert, well appearing  Patient states that she is unsure about her dates. She states that she had an LMP of 06/17/19 but also had some light spotting 07/16/19 that lasted for about 3 days.  U/S today does not reveal a pregnancy consistent with the LMP of 06/17/19 which should date patient at [redacted]w[redacted]d. ? Gestational sac measuring at [redacted]w[redacted]d which is consistent with the 07/16/19 LMP. Discussed scan with Dr. Gerri Spore who states to have patient to come back in about 2-3 weeks for a repeat scan.   UPT today is positive. Patient did not have a confirmed UPT in the chart.  PHQ9 score: 1   ASSESSMENT Normal pregnancy  PLAN Prenatal care Labs will be completed at New OB visit Baby rx ordered Patient will return in 3 weeks for repeat u/s to confirm viability.  Pregnancy confirmation letter provided for [facility]

## 2019-08-20 NOTE — Progress Notes (Signed)
Patient was assessed and managed by nursing staff during this encounter. I have reviewed the chart and agree with the documentation and plan. I have also made any necessary editorial changes.  Jaynie Collins, MD 08/20/2019 11:45 AM

## 2019-08-30 ENCOUNTER — Inpatient Hospital Stay (HOSPITAL_COMMUNITY): Payer: Medicaid Other

## 2019-08-30 ENCOUNTER — Inpatient Hospital Stay (HOSPITAL_COMMUNITY)
Admission: AD | Admit: 2019-08-30 | Discharge: 2019-08-30 | Disposition: A | Discharge status: Home / Self Care | Payer: Medicaid Other | Attending: Obstetrics and Gynecology | Admitting: Obstetrics and Gynecology

## 2019-08-30 ENCOUNTER — Encounter (HOSPITAL_COMMUNITY): Payer: Self-pay | Admitting: Obstetrics and Gynecology

## 2019-08-30 ENCOUNTER — Telehealth: Payer: Self-pay | Admitting: Medical

## 2019-08-30 DIAGNOSIS — O26899 Other specified pregnancy related conditions, unspecified trimester: Secondary | ICD-10-CM

## 2019-08-30 DIAGNOSIS — O209 Hemorrhage in early pregnancy, unspecified: Secondary | ICD-10-CM | POA: Diagnosis not present

## 2019-08-30 DIAGNOSIS — Z87891 Personal history of nicotine dependence: Secondary | ICD-10-CM | POA: Insufficient documentation

## 2019-08-30 DIAGNOSIS — O26891 Other specified pregnancy related conditions, first trimester: Secondary | ICD-10-CM | POA: Insufficient documentation

## 2019-08-30 DIAGNOSIS — O99011 Anemia complicating pregnancy, first trimester: Secondary | ICD-10-CM | POA: Diagnosis not present

## 2019-08-30 DIAGNOSIS — Z6791 Unspecified blood type, Rh negative: Secondary | ICD-10-CM

## 2019-08-30 DIAGNOSIS — Z880 Allergy status to penicillin: Secondary | ICD-10-CM | POA: Insufficient documentation

## 2019-08-30 DIAGNOSIS — A749 Chlamydial infection, unspecified: Secondary | ICD-10-CM

## 2019-08-30 DIAGNOSIS — D649 Anemia, unspecified: Secondary | ICD-10-CM | POA: Diagnosis not present

## 2019-08-30 DIAGNOSIS — R102 Pelvic and perineal pain: Secondary | ICD-10-CM | POA: Insufficient documentation

## 2019-08-30 DIAGNOSIS — Z3A01 Less than 8 weeks gestation of pregnancy: Secondary | ICD-10-CM | POA: Insufficient documentation

## 2019-08-30 DIAGNOSIS — O3481 Maternal care for other abnormalities of pelvic organs, first trimester: Secondary | ICD-10-CM | POA: Diagnosis not present

## 2019-08-30 DIAGNOSIS — O4691 Antepartum hemorrhage, unspecified, first trimester: Secondary | ICD-10-CM | POA: Diagnosis not present

## 2019-08-30 LAB — CBC
HCT: 35.6 % — ABNORMAL LOW (ref 36.0–46.0)
Hemoglobin: 11.7 g/dL — ABNORMAL LOW (ref 12.0–15.0)
MCH: 29 pg (ref 26.0–34.0)
MCHC: 32.9 g/dL (ref 30.0–36.0)
MCV: 88.1 fL (ref 80.0–100.0)
Platelets: 113 10*3/uL — ABNORMAL LOW (ref 150–400)
RBC: 4.04 MIL/uL (ref 3.87–5.11)
RDW: 15 % (ref 11.5–15.5)
WBC: 9.2 10*3/uL (ref 4.0–10.5)
nRBC: 0 % (ref 0.0–0.2)

## 2019-08-30 LAB — HCG, QUANTITATIVE, PREGNANCY: hCG, Beta Chain, Quant, S: 45887 m[IU]/mL — ABNORMAL HIGH (ref <5)

## 2019-08-30 LAB — WET PREP, GENITAL
Sperm: NONE SEEN
Trich, Wet Prep: NONE SEEN
Yeast Wet Prep HPF POC: NONE SEEN

## 2019-08-30 LAB — URINALYSIS, ROUTINE W REFLEX MICROSCOPIC
Bilirubin Urine: NEGATIVE
Glucose, UA: NEGATIVE mg/dL
Hgb urine dipstick: NEGATIVE
Ketones, ur: NEGATIVE mg/dL
Leukocytes,Ua: NEGATIVE
Nitrite: NEGATIVE
Protein, ur: NEGATIVE mg/dL
Specific Gravity, Urine: 1.02 (ref 1.005–1.030)
pH: 6 (ref 5.0–8.0)

## 2019-08-30 LAB — GC/CHLAMYDIA PROBE AMP (~~LOC~~) NOT AT ARMC
Chlamydia: POSITIVE — AB
Comment: NEGATIVE
Comment: NORMAL
Neisseria Gonorrhea: NEGATIVE

## 2019-08-30 MED ORDER — RHO D IMMUNE GLOBULIN 1500 UNIT/2ML IJ SOSY
300.0000 ug | PREFILLED_SYRINGE | Freq: Once | INTRAMUSCULAR | Status: AC
  Administered 2019-08-30: 300 ug via INTRAMUSCULAR
  Filled 2019-08-30: qty 2

## 2019-08-30 MED ORDER — AZITHROMYCIN 250 MG PO TABS: 1000.0000 mg | ORAL_TABLET | Freq: Once | ORAL | 0 refills | Status: AC

## 2019-08-30 NOTE — Telephone Encounter (Addendum)
Sherry Leach tested positive for  Chlamydia. Patient was called by RN and allergies and pharmacy confirmed. Rx sent to pharmacy of choice.   Sherry Lowenstein, PA-C 08/30/2019 4:32 PM      ----- Message from Kathe Becton, RN sent at 08/30/2019  3:18 PM EDT ----- This patient tested positive for: Chlamydia  She [is allergic to:   "Penicillin,&  Amoxicillin" ,  I have informed the patient of her results and confirmed her pharmacy is correct in her chart. Please send Rx.   Thank you,   Kathe Becton, RN   Results faxed to [facility] Department.

## 2019-08-30 NOTE — MAU Note (Signed)
Having abd cramping for few days. This am after intercourse noted some vag bleeding.

## 2019-08-30 NOTE — MAU Provider Note (Signed)
Chief Complaint: Abdominal Pain and Vaginal Bleeding   First Provider Initiated Contact with Patient 08/30/19 0557        SUBJECTIVE HPI: Sherry Leach is a 24 y.o. G2P1001 at [redacted]w[redacted]d by [place] who presents to maternity admissions reporting cramping and post coital bleeding tonight.  Bleeding was heavier than she expected so she came in.   Has been seen in the office and had an [place] but no bloodwork.  They saw a gestational sac but no YS or embryo. . She denies vaginal bleeding, vaginal itching/burning, urinary symptoms, h/a, dizziness, n/v, or fever/chills.     Abdominal Pain This is a new problem. The current episode started in the past 7 days. The onset quality is gradual. The problem occurs intermittently. The pain is located in the suprapubic region. The quality of the pain is cramping. Pertinent negatives include no constipation, diarrhea, dysuria, fever, myalgias, nausea or vomiting. Nothing aggravates the pain. The pain is relieved by nothing. She has tried nothing for the symptoms.  Vaginal Bleeding The patient's primary symptoms include pelvic pain and vaginal bleeding. The patient's pertinent negatives include no genital itching, genital lesions or genital odor. This is a new problem. The current episode started today. The problem occurs intermittently. The pain is mild. Associated symptoms include abdominal pain. Pertinent negatives include no constipation, diarrhea, dysuria, fever, nausea or vomiting.   RN Note: Having abd cramping for few days. This am after intercourse noted some vag bleeding  Past Medical History:  Diagnosis Date  . Anemia   . Seasonal allergies   . SVD (spontaneous vaginal delivery) 09/14/2017   x 1   Past Surgical History:  Procedure Laterality Date  . DILATION AND EVACUATION N/A 09/22/2017   Procedure: DILATATION AND EVACUATION;  Surgeon: Clay Bing, MD;  Location: [facility];  Service: Gynecology;  Laterality: N/A;  . HYSTEROSCOPY WITH D & C N/A 12/19/2017    Procedure: DILATATION AND CURETTAGE /DIAGNOSTIC HYSTEROSCOPY;  Surgeon: Tereso Newcomer, MD;  Location: [facility];  Service: Gynecology;  Laterality: N/A;  rep will be here confirmed on 12/18/17  . MOUTH SURGERY     teeth ext  . WISDOM TOOTH EXTRACTION     Social History   Socioeconomic History  . Marital status: Single    Spouse name: Not on file  . Number of children: 1  . Years of education: Not on file  . Highest education level: Not on file  Occupational History  . Not on file  Tobacco Use  . Smoking status: Former Smoker    Packs/day: 0.25    Years: 4.00    Pack years: 1.00    Types: Cigarettes  . Smokeless tobacco: Never Used  . Tobacco comment: last smoke 1.5 weeks ago  Vaping Use  . Vaping Use: Never used  Substance and Sexual Activity  . Alcohol use: Not Currently    Comment: last drink was 1 month ago  . Drug use: No  . Sexual activity: Yes    Partners: Male    Birth control/protection: None  Other Topics Concern  . Not on file  Social History Narrative  . Not on file   Social Determinants of Health   Financial Resource Strain:   . Difficulty of Paying Living Expenses:   Food Insecurity:   . Worried About Programme researcher, broadcasting/film/video in the Last Year:   . Barista in the Last Year:   Transportation Needs:   . Freight forwarder (Medical):   Marland Kitchen  Lack of Transportation (Non-Medical):   Physical Activity:   . Days of Exercise per Week:   . Minutes of Exercise per Session:   Stress:   . Feeling of Stress :   Social Connections:   . Frequency of Communication with Friends and Family:   . Frequency of Social Gatherings with Friends and Family:   . Attends Religious Services:   . Active Member of Clubs or Organizations:   . Attends BankerClub or Organization Meetings:   Marland Kitchen. Marital Status:   Intimate Partner Violence:   . Fear of Current or Ex-Partner:   . Emotionally Abused:   Marland Kitchen. Physically Abused:   . Sexually Abused:    No current facility-administered  medications on file prior to encounter.   Current Outpatient Medications on File Prior to Encounter  Medication Sig Dispense Refill  . Prenatal MV-Min-FA-Omega-3 (PRENATAL GUMMIES/DHA & FA) 0.4-32.5 MG CHEW Chew by mouth.     Allergies  Allergen Reactions  . Amoxicillin Rash    Has patient had a PCN reaction causing immediate rash, facial/tongue/throat swelling, SOB or lightheadedness with hypotension: No Has patient had a PCN reaction causing severe rash involving mucus membranes or skin necrosis: No Has patient had a PCN reaction that required hospitalization: No Has patient had a PCN reaction occurring within the last 10 years: Yes If all of the above answers are "NO", then may proceed with Cephalosporin use.   Marland Kitchen. Penicillins Rash    Has patient had a PCN reaction causing immediate rash, facial/tongue/throat swelling, SOB or lightheadedness with hypotension: No Has patient had a PCN reaction causing severe rash involving mucus membranes or skin necrosis: No Has patient had a PCN reaction that required hospitalization: No Has patient had a PCN reaction occurring within the last 10 years: Yes If all of the above answers are "NO", then may proceed with Cephalosporin use.     I have reviewed patient's Past Medical Hx, Surgical Hx, Family Hx, Social Hx, medications and allergies.   ROS:  Review of Systems  Constitutional: Negative for fever.  Gastrointestinal: Positive for abdominal pain. Negative for constipation, diarrhea, nausea and vomiting.  Genitourinary: Positive for pelvic pain and vaginal bleeding. Negative for dysuria.  Musculoskeletal: Negative for myalgias.   Review of Systems  Other systems negative   Physical Exam  Physical Exam Patient Vitals for the past 24 hrs:  BP Temp Pulse Resp Height Weight  08/30/19 0543 (!) 109/59 -- 79 -- -- --  08/30/19 0542 -- 98.3 F (36.8 C) -- 16 5' 8.5" (1.74 m) 72.1 kg   Constitutional: Well-developed, well-nourished female in  no acute distress.  Cardiovascular: normal rate Respiratory: normal effort GI: Abd soft, non-tender. Pos BS x 4 MS: Extremities nontender, no edema, normal ROM Neurologic: Alert and oriented x 4.  GU: Neg CVAT.  PELVIC EXAM: Cervix pink, visually closed, without lesion, scant red discharge, vaginal walls and external genitalia normal  LAB RESULTS    Results for orders placed or performed during the hospital encounter of 08/30/19 (from the past 24 hour(s))  Urinalysis, Routine w reflex microscopic     Status: None   Collection Time: 08/30/19  5:58 AM  Result Value Ref Range   Color, Urine YELLOW YELLOW   APPearance CLEAR CLEAR   Specific Gravity, Urine 1.020 1.005 - 1.030   pH 6.0 5.0 - 8.0   Glucose, UA NEGATIVE NEGATIVE mg/dL   Hgb urine dipstick NEGATIVE NEGATIVE   Bilirubin Urine NEGATIVE NEGATIVE   Ketones, ur NEGATIVE NEGATIVE  mg/dL   Protein, ur NEGATIVE NEGATIVE mg/dL   Nitrite NEGATIVE NEGATIVE   Leukocytes,Ua NEGATIVE NEGATIVE  Wet prep, genital     Status: Abnormal   Collection Time: 08/30/19  6:01 AM   Specimen: PATH Cytology Cervicovaginal Ancillary Only  Result Value Ref Range   Yeast Wet Prep HPF POC NONE SEEN NONE SEEN   Trich, Wet Prep NONE SEEN NONE SEEN   Clue Cells Wet Prep HPF POC PRESENT (A) NONE SEEN   WBC, Wet Prep HPF POC MANY (A) NONE SEEN   Sperm NONE SEEN   Rh IG workup (includes ABO/Rh)     Status: None   Collection Time: 08/30/19  6:15 AM  Result Value Ref Range   Gestational Age(Wks) 6    ABO/RH(D)      O NEG Performed at [facility], [address], [place], [place] [address]   CBC     Status: Abnormal (Preliminary result)   Collection Time: 08/30/19  6:15 AM  Result Value Ref Range   WBC 9.2 4.0 - 10.5 K/uL   RBC 4.04 3.87 - 5.11 MIL/uL   Hemoglobin 11.7 (L) 12.0 - 15.0 g/dL   HCT 97.9 (L) 36 - 46 %   MCV 88.1 80.0 - 100.0 fL   MCH 29.0 26.0 - 34.0 pg   MCHC 32.9 30.0 - 36.0 g/dL   RDW 89.2 11.9 - 41.7 %   Platelets PENDING  150 - 400 K/uL   nRBC 0.0 0.0 - 0.2 %   HCG pending  IMAGING US OB Comp Less 14 Wks  Result Date: 08/30/2019 CLINICAL DATA:  Bleeding. Pelvic pain. Positive urine pregnancy test. EXAM: OBSTETRIC <14 WK [place] AND TRANSVAGINAL OB US TECHNIQUE: Both transabdominal and transvaginal ultrasound examinations were performed for complete evaluation of the gestation as well as the maternal uterus, adnexal regions, and pelvic cul-de-sac. Transvaginal technique was performed to assess early pregnancy. COMPARISON:  Ultrasound 08/20/2019. FINDINGS: Intrauterine gestational sac: Present Yolk sac:  Present Embryo:  Present Cardiac Activity: Present Heart Rate: 120 bpm CRL: 5 mm   6 w   1 d                  [place] EDC: 04/23/2020 Subchorionic hemorrhage:  None visualized Maternal uterus/adnexae: Tiny left ovarian simple appearing cyst. Right ovary not visualized. Trace free pelvic fluid. IMPRESSION: 1.  Single viable intrauterine pregnancy at 6 weeks 1 day. 2.  Trace free pelvic fluid. Electronically Signed   By: Maisie Fus  Register   On: 08/30/2019 06:44     MAU Management/MDM: Ordered usual first trimester r/o ectopic labs.   Pelvic exam and cultures done Will check baseline Ultrasound to rule out ectopic.  This bleeding/pain can represent a normal pregnancy with bleeding, spontaneous abortion or even an ectopic which can be life-threatening.  The process as listed above helps to determine which of these is present.  Results reviewed We now see an embryo with a heartbeat This rules out ectopic pregnancy Discussed this post coital bleeding will resolve Rhophylac ordered  ASSESSMENT 1. Vaginal bleeding in pregnancy, first trimester   2. Pelvic pain affecting pregnancy   3. Vaginal bleeding in pregnancy, first trimester   4. Pelvic pain affecting pregnancy     PLAN Discharge home Rhophylac given today Pelvic rest  Follow up in office  Pt stable at time of discharge. Encouraged to return here or to other  Urgent Care/ED if she develops worsening of symptoms, increase in pain, fever, or other concerning  symptoms.    Wynelle Bourgeois CNM, MSN Certified Nurse-Midwife 08/30/2019  6:02 AM

## 2019-08-31 LAB — RH IG WORKUP (INCLUDES ABO/RH)
ABO/RH(D): O NEG
Antibody Screen: NEGATIVE
Gestational Age(Wks): 6
Unit division: 0

## 2019-09-10 ENCOUNTER — Other Ambulatory Visit: Payer: Medicaid Other

## 2019-09-10 ENCOUNTER — Other Ambulatory Visit: Payer: Self-pay

## 2019-10-01 ENCOUNTER — Other Ambulatory Visit (HOSPITAL_COMMUNITY)
Admission: RE | Admit: 2019-10-01 | Discharge: 2019-10-01 | Disposition: A | Discharge status: Home / Self Care | Payer: Medicaid Other | Source: Ambulatory Visit | Attending: Obstetrics and Gynecology | Admitting: Obstetrics and Gynecology

## 2019-10-01 ENCOUNTER — Encounter: Payer: Self-pay | Admitting: Obstetrics and Gynecology

## 2019-10-01 ENCOUNTER — Ambulatory Visit (INDEPENDENT_AMBULATORY_CARE_PROVIDER_SITE_OTHER): Payer: Medicaid Other | Admitting: Obstetrics and Gynecology

## 2019-10-01 ENCOUNTER — Other Ambulatory Visit: Payer: Self-pay

## 2019-10-01 DIAGNOSIS — Z3480 Encounter for supervision of other normal pregnancy, unspecified trimester: Secondary | ICD-10-CM

## 2019-10-01 DIAGNOSIS — Z3A11 11 weeks gestation of pregnancy: Secondary | ICD-10-CM | POA: Diagnosis not present

## 2019-10-01 DIAGNOSIS — Z3481 Encounter for supervision of other normal pregnancy, first trimester: Secondary | ICD-10-CM | POA: Diagnosis not present

## 2019-10-01 LAB — POCT URINALYSIS DIPSTICK
Bilirubin, UA: NEGATIVE
Blood, UA: NEGATIVE
Glucose, UA: NEGATIVE
Ketones, UA: NEGATIVE
Leukocytes, UA: NEGATIVE
Nitrite, UA: NEGATIVE
Odor: NEGATIVE
Protein, UA: NEGATIVE
Spec Grav, UA: 1.015 (ref 1.010–1.025)
Urobilinogen, UA: 0.2 E.U./dL
pH, UA: 6 (ref 5.0–8.0)

## 2019-10-01 NOTE — Progress Notes (Signed)
poct Pt presents for NOB visit c/o bilateral low back pain and urinary urgency x 1-2 weeks.  Normal pap 10/23/17

## 2019-10-01 NOTE — Progress Notes (Signed)
Subjective:  Sherry Leach is a G2P1001 [redacted]w[redacted]d being seen today for her first obstetrical visit.  Her obstetrical history is significant for none. Patient does intend to breast feed. Pregnancy history fully reviewed.  Patient reports urinary frequency.  BP 115/71   Pulse 69   Wt 156 lb 14.4 oz (71.2 kg)   LMP 06/17/2019 (Approximate)   BMI 23.51 kg/m   HISTORY: OB History  Gravida Para Term Preterm AB Living  2 1 1     1   SAB TAB Ectopic Multiple Live Births        0 1    # Outcome Date GA Lbr Len/2nd Weight Sex Delivery Anes PTL Lv  2 Current           1 Term 09/14/17 [redacted]w[redacted]d 05:31 / 00:12 6 lb 12.8 oz (3.085 kg) F Vag-Spont EPI  LIV    Past Medical History:  Diagnosis Date  . Anemia   . Seasonal allergies   . SVD (spontaneous vaginal delivery) 09/14/2017   x 1    Past Surgical History:  Procedure Laterality Date  . DILATION AND EVACUATION N/A 09/22/2017   Procedure: DILATATION AND EVACUATION;  Surgeon: 09/24/2017, MD;  Location: [facility];  Service: Gynecology;  Laterality: N/A;  . HYSTEROSCOPY WITH D & C N/A 12/19/2017   Procedure: DILATATION AND CURETTAGE /DIAGNOSTIC HYSTEROSCOPY;  Surgeon: 12/21/2017, MD;  Location: [facility];  Service: Gynecology;  Laterality: N/A;  rep will be here confirmed on 12/18/17  . MOUTH SURGERY     teeth ext  . WISDOM TOOTH EXTRACTION      Family History  Problem Relation Age of Onset  . Hypertension Mother   . Hypertension Other      Exam  BP 115/71   Pulse 69   Wt 156 lb 14.4 oz (71.2 kg)   LMP 06/17/2019 (Approximate)   BMI 23.51 kg/m   Chaperone present during exam  CONSTITUTIONAL: Well-developed, well-nourished female in no acute distress.  HENT:  Normocephalic, atraumatic, External right and left ear normal. Oropharynx is clear and moist EYES: Conjunctivae and EOM are normal. Pupils are equal, round, and reactive to light. No scleral icterus.  NECK: Normal range of motion, supple, no masses.  Normal thyroid.   CARDIOVASCULAR: Normal heart rate noted, regular rhythm RESPIRATORY: Clear to auscultation bilaterally. Effort and breath sounds normal, no problems with respiration noted. BREASTS: Symmetric in size. No masses, skin changes, nipple drainage, or lymphadenopathy. ABDOMEN: Soft, normal bowel sounds, no distention noted.  No tenderness, rebound or guarding.  PELVIC: Normal appearing external genitalia; normal appearing vaginal mucosa and cervix. No abnormal discharge noted. Normal uterine size, no other palpable masses, no uterine or adnexal tenderness. MUSCULOSKELETAL: Normal range of motion. No tenderness.  No cyanosis, clubbing, or edema.  2+ distal pulses. SKIN: Skin is warm and dry. No rash noted. Not diaphoretic. No erythema. No pallor. NEUROLOGIC: Alert and oriented to person, place, and time. Normal reflexes, muscle tone coordination. No cranial nerve deficit noted. PSYCHIATRIC: Normal mood and affect. Normal behavior. Normal judgment and thought content.    Assessment:    Pregnancy: G2P1001 Patient Active Problem List   Diagnosis Date Noted  . Encounter for supervision of normal pregnancy, unspecified, unspecified trimester 08/20/2019      Plan:   1. Supervision of other normal pregnancy, antepartum - Prenatal labs ordered. - Genetic screening discussed.  Patient desires testing.  Labs ordered. - Pap smear and cultures obtained. - Routine prenatal care discussed.  Problem list reviewed and updated. 50% of 40 min visit spent on counseling and coordination of care.     Levie Heritage 10/01/2019

## 2019-10-02 LAB — CBC/D/PLT+RPR+RH+ABO+RUB AB...
Basophils Absolute: 0 10*3/uL (ref 0.0–0.2)
Basos: 0 %
EOS (ABSOLUTE): 0.3 10*3/uL (ref 0.0–0.4)
Eos: 3 %
HCV Ab: 0.2 s/co ratio (ref 0.0–0.9)
HIV Screen 4th Generation wRfx: NONREACTIVE
Hematocrit: 37.5 % (ref 34.0–46.6)
Hemoglobin: 12.3 g/dL (ref 11.1–15.9)
Hepatitis B Surface Ag: NEGATIVE
Immature Grans (Abs): 0 10*3/uL (ref 0.0–0.1)
Immature Granulocytes: 0 %
Lymphocytes Absolute: 2.3 10*3/uL (ref 0.7–3.1)
Lymphs: 23 %
MCH: 29.5 pg (ref 26.6–33.0)
MCHC: 32.8 g/dL (ref 31.5–35.7)
MCV: 90 fL (ref 79–97)
Monocytes Absolute: 0.6 10*3/uL (ref 0.1–0.9)
Monocytes: 6 %
Neutrophils Absolute: 6.7 10*3/uL (ref 1.4–7.0)
Neutrophils: 68 %
Platelets: 147 10*3/uL — ABNORMAL LOW (ref 150–450)
RBC: 4.17 x10E6/uL (ref 3.77–5.28)
RDW: 15.1 % (ref 11.7–15.4)
RPR Ser Ql: NONREACTIVE
Rh Factor: NEGATIVE
Rubella Antibodies, IGG: 1 index (ref >0.99)
WBC: 10 10*3/uL (ref 3.4–10.8)

## 2019-10-02 LAB — HCV INTERPRETATION

## 2019-10-02 LAB — CERVICOVAGINAL ANCILLARY ONLY
Chlamydia: NEGATIVE
Comment: NEGATIVE
Comment: NEGATIVE
Comment: NORMAL
Neisseria Gonorrhea: NEGATIVE
Trichomonas: NEGATIVE

## 2019-10-02 LAB — AB SCR+ANTIBODY ID: Antibody Screen: POSITIVE — AB

## 2019-10-02 LAB — CYTOLOGY - PAP: Diagnosis: NEGATIVE

## 2019-10-04 LAB — CULTURE, OB URINE

## 2019-10-04 LAB — URINE CULTURE, OB REFLEX

## 2019-10-08 ENCOUNTER — Encounter: Payer: Self-pay | Admitting: Obstetrics and Gynecology

## 2019-10-09 ENCOUNTER — Encounter: Payer: Self-pay | Admitting: Obstetrics and Gynecology

## 2019-10-15 ENCOUNTER — Other Ambulatory Visit: Payer: Self-pay

## 2019-10-15 ENCOUNTER — Encounter (HOSPITAL_COMMUNITY): Payer: Self-pay | Admitting: Obstetrics and Gynecology

## 2019-10-15 ENCOUNTER — Inpatient Hospital Stay (HOSPITAL_COMMUNITY)
Admission: AD | Admit: 2019-10-15 | Discharge: 2019-10-15 | Disposition: A | Discharge status: Home / Self Care | Payer: Medicaid Other | Attending: Obstetrics and Gynecology | Admitting: Obstetrics and Gynecology

## 2019-10-15 DIAGNOSIS — Z3A13 13 weeks gestation of pregnancy: Secondary | ICD-10-CM | POA: Diagnosis not present

## 2019-10-15 DIAGNOSIS — O26891 Other specified pregnancy related conditions, first trimester: Secondary | ICD-10-CM | POA: Diagnosis not present

## 2019-10-15 DIAGNOSIS — Z880 Allergy status to penicillin: Secondary | ICD-10-CM | POA: Insufficient documentation

## 2019-10-15 DIAGNOSIS — Z8659 Personal history of other mental and behavioral disorders: Secondary | ICD-10-CM | POA: Insufficient documentation

## 2019-10-15 DIAGNOSIS — R42 Dizziness and giddiness: Secondary | ICD-10-CM

## 2019-10-15 DIAGNOSIS — Z87891 Personal history of nicotine dependence: Secondary | ICD-10-CM | POA: Diagnosis not present

## 2019-10-15 DIAGNOSIS — Z3480 Encounter for supervision of other normal pregnancy, unspecified trimester: Secondary | ICD-10-CM

## 2019-10-15 DIAGNOSIS — O99891 Other specified diseases and conditions complicating pregnancy: Secondary | ICD-10-CM

## 2019-10-15 DIAGNOSIS — Z3491 Encounter for supervision of normal pregnancy, unspecified, first trimester: Secondary | ICD-10-CM

## 2019-10-15 LAB — COMPREHENSIVE METABOLIC PANEL
ALT: 16 U/L (ref 0–44)
AST: 17 U/L (ref 15–41)
Albumin: 3.5 g/dL (ref 3.5–5.0)
Alkaline Phosphatase: 48 U/L (ref 38–126)
Anion gap: 8 (ref 5–15)
BUN: 5 mg/dL — ABNORMAL LOW (ref 6–20)
CO2: 21 mmol/L — ABNORMAL LOW (ref 22–32)
Calcium: 9 mg/dL (ref 8.9–10.3)
Chloride: 103 mmol/L (ref 98–111)
Creatinine, Ser: 0.51 mg/dL (ref 0.44–1.00)
GFR calc Af Amer: >60 mL/min (ref >60)
GFR calc non Af Amer: >60 mL/min (ref >60)
Glucose, Bld: 87 mg/dL (ref 70–99)
Potassium: 3.8 mmol/L (ref 3.5–5.1)
Sodium: 132 mmol/L — ABNORMAL LOW (ref 135–145)
Total Bilirubin: 0.4 mg/dL (ref 0.3–1.2)
Total Protein: 6.6 g/dL (ref 6.5–8.1)

## 2019-10-15 LAB — CBC
HCT: 35.2 % — ABNORMAL LOW (ref 36.0–46.0)
Hemoglobin: 11.9 g/dL — ABNORMAL LOW (ref 12.0–15.0)
MCH: 30.3 pg (ref 26.0–34.0)
MCHC: 33.8 g/dL (ref 30.0–36.0)
MCV: 89.6 fL (ref 80.0–100.0)
Platelets: 150 10*3/uL (ref 150–400)
RBC: 3.93 MIL/uL (ref 3.87–5.11)
RDW: 15.1 % (ref 11.5–15.5)
WBC: 9.7 10*3/uL (ref 4.0–10.5)
nRBC: 0 % (ref 0.0–0.2)

## 2019-10-15 LAB — URINALYSIS, ROUTINE W REFLEX MICROSCOPIC
Bilirubin Urine: NEGATIVE
Glucose, UA: NEGATIVE mg/dL
Hgb urine dipstick: NEGATIVE
Ketones, ur: NEGATIVE mg/dL
Leukocytes,Ua: NEGATIVE
Nitrite: NEGATIVE
Protein, ur: NEGATIVE mg/dL
Specific Gravity, Urine: 1.005 (ref 1.005–1.030)
pH: 7 (ref 5.0–8.0)

## 2019-10-15 NOTE — Discharge Instructions (Signed)
Eating Plan for Pregnant Women While you are pregnant, your body requires additional nutrition to help support your growing baby. You also have a higher need for some vitamins and minerals, such as folic acid, calcium, iron, and vitamin D. Eating a healthy, well-balanced diet is very important for your health and your baby's health. Your need for extra calories varies for the three 3-month segments of your pregnancy (trimesters). For most women, it is recommended to consume:  150 extra calories a day during the first trimester.  300 extra calories a day during the second trimester.  300 extra calories a day during the third trimester. What are tips for following this plan?   Do not try to lose weight or go on a diet during pregnancy.  Limit your overall intake of foods that have "empty calories." These are foods that have little nutritional value, such as sweets, desserts, candies, and sugar-sweetened beverages.  Eat a variety of foods (especially fruits and vegetables) to get a full range of vitamins and minerals.  Take a prenatal vitamin to help meet your additional vitamin and mineral needs during pregnancy, specifically for folic acid, iron, calcium, and vitamin D.  Remember to stay active. Ask your health care provider what types of exercise and activities are safe for you.  Practice good food safety and cleanliness. Wash your hands before you eat and after you prepare raw meat. Wash all fruits and vegetables well before peeling or eating. Taking these actions can help to prevent food-borne illnesses that can be very dangerous to your baby, such as listeriosis. Ask your health care provider for more information about listeriosis. What does 150 extra calories look like? Healthy options that provide 150 extra calories each day could be any of the following:  6-8 oz (170-230 g) of plain low-fat yogurt with  cup of berries.  1 apple with 2 teaspoons (11 g) of peanut butter.  Cut-up  vegetables with  cup (60 g) of hummus.  8 oz (230 mL) or 1 cup of low-fat chocolate milk.  1 stick of string cheese with 1 medium orange.  1 peanut butter and jelly sandwich that is made with one slice of whole-wheat bread and 1 tsp (5 g) of peanut butter. For 300 extra calories, you could eat two of those healthy options each day. What is a healthy amount of weight to gain? The right amount of weight gain for you is based on your BMI before you became pregnant. If your BMI:  Was less than 18 (underweight), you should gain 28-40 lb (13-18 kg).  Was 18-24.9 (normal), you should gain 25-35 lb (11-16 kg).  Was 25-29.9 (overweight), you should gain 15-25 lb (7-11 kg).  Was 30 or greater (obese), you should gain 11-20 lb (5-9 kg). What if I am having twins or multiples? Generally, if you are carrying twins or multiples:  You may need to eat 300-600 extra calories a day.  The recommended range for total weight gain is 25-54 lb (11-25 kg), depending on your BMI before pregnancy.  Talk with your health care provider to find out about nutritional needs, weight gain, and exercise that is right for you. What foods can I eat?  Fruits All fruits. Eat a variety of colors and types of fruit. Remember to wash your fruits well before peeling or eating. Vegetables All vegetables. Eat a variety of colors and types of vegetables. Remember to wash your vegetables well before peeling or eating. Grains All grains. Choose whole grains, such   as whole-wheat bread, oatmeal, or brown rice. Meats and other protein foods Lean meats, including chicken, turkey, fish, and lean cuts of beef, veal, or pork. If you eat fish or seafood, choose options that are higher in omega-3 fatty acids and lower in mercury, such as salmon, herring, mussels, trout, sardines, pollock, shrimp, crab, and lobster. Tofu. Tempeh. Beans. Eggs. Peanut butter and other nut butters. Make sure that all meats, poultry, and eggs are cooked to  food-safe temperatures or "well-done." Two or more servings of fish are recommended each week in order to get the most benefits from omega-3 fatty acids that are found in seafood. Choose fish that are lower in mercury. You can find more information online:  www.fda.gov Dairy Pasteurized milk and milk alternatives (such as almond milk). Pasteurized yogurt and pasteurized cheese. Cottage cheese. Sour cream. Beverages Water. Juices that contain 100% fruit juice or vegetable juice. Caffeine-free teas and decaffeinated coffee. Drinks that contain caffeine are okay to drink, but it is better to avoid caffeine. Keep your total caffeine intake to less than 200 mg each day (which is 12 oz or 355 mL of coffee, tea, or soda) or the limit as told by your health care provider. Fats and oils Fats and oils are okay to include in moderation. Sweets and desserts Sweets and desserts are okay to include in moderation. Seasoning and other foods All pasteurized condiments. The items listed above may not be a complete list of foods and beverages you can eat. Contact a dietitian for more information. What foods are not recommended? Fruits Unpasteurized fruit juices. Vegetables Raw (unpasteurized) vegetable juices. Meats and other protein foods Lunch meats, bologna, hot dogs, or other deli meats. (If you must eat those meats, reheat them until they are steaming hot.) Refrigerated pat, meat spreads from a meat counter, smoked seafood that is found in the refrigerated section of a store. Raw or undercooked meats, poultry, and eggs. Raw fish, such as sushi or sashimi. Fish that have high mercury content, such as tilefish, shark, swordfish, and king mackerel. To learn more about mercury in fish, talk with your health care provider or look for online resources, such as:  www.fda.gov Dairy Raw (unpasteurized) milk and any foods that have raw milk in them. Soft cheeses, such as feta, queso blanco, queso fresco, Brie,  Camembert cheeses, blue-veined cheeses, and Panela cheese (unless it is made with pasteurized milk, which must be stated on the label). Beverages Alcohol. Sugar-sweetened beverages, such as sodas, teas, or energy drinks. Seasoning and other foods Homemade fermented foods and drinks, such as pickles, sauerkraut, or kombucha drinks. (Store-bought pasteurized versions of these are okay.) Salads that are made in a store or deli, such as ham salad, chicken salad, egg salad, tuna salad, and seafood salad. The items listed above may not be a complete list of foods and beverages you should avoid. Contact a dietitian for more information. Where to find more information To calculate the number of calories you need based on your height, weight, and activity level, you can use an online calculator such as:  www.choosemyplate.gov/MyPlatePlan To calculate how much weight you should gain during pregnancy, you can use an online pregnancy weight gain calculator such as:  www.choosemyplate.gov/pregnancy-weight-gain-calculator Summary  While you are pregnant, your body requires additional nutrition to help support your growing baby.  Eat a variety of foods, especially fruits and vegetables to get a full range of vitamins and minerals.  Practice good food safety and cleanliness. Wash your hands before you eat   and after you prepare raw meat. Wash all fruits and vegetables well before peeling or eating. Taking these actions can help to prevent food-borne illnesses, such as listeriosis, that can be very dangerous to your baby.  Do not eat raw meat or fish. Do not eat fish that have high mercury content, such as tilefish, shark, swordfish, and king mackerel. Do not eat unpasteurized (raw) dairy.  Take a prenatal vitamin to help meet your additional vitamin and mineral needs during pregnancy, specifically for folic acid, iron, calcium, and vitamin D. This information is not intended to replace advice given to you by  your health care provider. Make sure you discuss any questions you have with your health care provider. Document Revised: 06/07/2018 Document Reviewed: 10/14/2016 Elsevier Patient Education  2020 [employer]  

## 2019-10-15 NOTE — MAU Note (Signed)
Presents with c/o dizziness.  States saw all black, got lightheaded and began sweating, felt as if were going to pass out.  No VB or LOF.

## 2019-10-15 NOTE — MAU Provider Note (Addendum)
History    CSN: [id number]  Arrival date and time: 10/15/19 5573   First Provider Initiated Contact with Patient 10/15/19 1001      Chief Complaint  Patient presents with  . Dizziness   HPI  Sherry Leach is a 24 yo G37P1001 female that presents to MAU with complaints of dizziness.  Patient reported bleeding to registgration clerk but denied bleeding to triage nurse, PA-student Rodman Pickle and CNM. Reports visual field went black this morning while at work at [employer]. States she sat down and experienced sweating and SOB for ~10 minutes till the episode resolved. She denies syncope. Positive for throbbing headache that improves with tylenol, no headache this morning. Occasionally sees black spots in vision with headache, but not every episode. States she has a history of anxiety and panic attacks but this episode felt different due to the SOB and sweating.  Patient denies all pregnancy-specific concerns including abdominal pain, vaginal bleeding, abnormal vaginal discharge, dysuria, fever or recent illness.   Patient endorses normal morning routine including breakfast of banana bread and chocolate milk.   She receives care with [facility].  OB History    Gravida  2   Para  1   Term  1   Preterm      AB      Living  1     SAB      TAB      Ectopic      Multiple  0   Live Births  1           Past Medical History:  Diagnosis Date  . Anemia   . Seasonal allergies   . SVD (spontaneous vaginal delivery) 09/14/2017   x 1    Past Surgical History:  Procedure Laterality Date  . DILATION AND EVACUATION N/A 09/22/2017   Procedure: DILATATION AND EVACUATION;  Surgeon: Eastman Bing, MD;  Location: [facility];  Service: Gynecology;  Laterality: N/A;  . HYSTEROSCOPY WITH D & C N/A 12/19/2017   Procedure: DILATATION AND CURETTAGE /DIAGNOSTIC HYSTEROSCOPY;  Surgeon: Tereso Newcomer, MD;  Location: [facility];  Service: Gynecology;  Laterality: N/A;  rep will be here confirmed on  12/18/17  . MOUTH SURGERY     teeth ext  . WISDOM TOOTH EXTRACTION      Family History  Problem Relation Age of Onset  . Hypertension Mother   . Hypertension Other     Social History   Tobacco Use  . Smoking status: Former Smoker    Packs/day: 0.25    Years: 4.00    Pack years: 1.00    Types: Cigarettes  . Smokeless tobacco: Never Used  . Tobacco comment: last smoke 1.5 weeks ago  Vaping Use  . Vaping Use: Never used  Substance Use Topics  . Alcohol use: Not Currently    Comment: last drink was 1 month ago  . Drug use: No    Allergies:  Allergies  Allergen Reactions  . Amoxicillin Rash    Has patient had a PCN reaction causing immediate rash, facial/tongue/throat swelling, SOB or lightheadedness with hypotension: No Has patient had a PCN reaction causing severe rash involving mucus membranes or skin necrosis: No Has patient had a PCN reaction that required hospitalization: No Has patient had a PCN reaction occurring within the last 10 years: Yes If all of the above answers are "NO", then may proceed with Cephalosporin use.   Marland Kitchen Penicillins Rash    Has patient had a PCN reaction causing immediate rash,  facial/tongue/throat swelling, SOB or lightheadedness with hypotension: No Has patient had a PCN reaction causing severe rash involving mucus membranes or skin necrosis: No Has patient had a PCN reaction that required hospitalization: No Has patient had a PCN reaction occurring within the last 10 years: Yes If all of the above answers are "NO", then may proceed with Cephalosporin use.     Medications Prior to Admission  Medication Sig Dispense Refill Last Dose  . Prenatal MV-Min-FA-Omega-3 (PRENATAL GUMMIES/DHA & FA) 0.4-32.5 MG CHEW Chew by mouth.   10/15/2019 at 0400    Review of Systems  Constitutional: Negative for appetite change.  HENT: Positive for congestion. Negative for sinus pressure.   Eyes: Positive for visual disturbance.  Respiratory: Positive for  shortness of breath (during pre-syncopal episode).   Gastrointestinal: Negative for constipation, diarrhea, nausea and vomiting.  Genitourinary: Negative for pelvic pain, vaginal bleeding and vaginal discharge.  Neurological: Positive for dizziness and light-headedness.   Physical Exam   Blood pressure 104/60, pulse 69, temperature 98.3 F (36.8 C), temperature source Oral, resp. rate 20, height 5' 8.5" (1.74 m), weight 72 kg, last menstrual period 06/17/2019, SpO2 100 %.  Physical Exam Constitutional:      General: She is not in acute distress.    Appearance: Normal appearance.  HENT:     Head: Normocephalic and atraumatic.  Cardiovascular:     Rate and Rhythm: Normal rate and regular rhythm.     Pulses: Normal pulses.  Pulmonary:     Effort: Pulmonary effort is normal.     Breath sounds: Normal breath sounds.  Abdominal:     Palpations: Abdomen is soft.     Tenderness: There is no abdominal tenderness. There is no guarding or rebound.  Neurological:     Mental Status: She is alert.    Results for orders placed or performed during the hospital encounter of 10/15/19 (from the past 24 hour(s))  Urinalysis, Routine w reflex microscopic Urine, Clean Catch     Status: None   Collection Time: 10/15/19  8:13 AM  Result Value Ref Range   Color, Urine YELLOW YELLOW   APPearance CLEAR CLEAR   Specific Gravity, Urine 1.005 1.005 - 1.030   pH 7.0 5.0 - 8.0   Glucose, UA NEGATIVE NEGATIVE mg/dL   Hgb urine dipstick NEGATIVE NEGATIVE   Bilirubin Urine NEGATIVE NEGATIVE   Ketones, ur NEGATIVE NEGATIVE mg/dL   Protein, ur NEGATIVE NEGATIVE mg/dL   Nitrite NEGATIVE NEGATIVE   Leukocytes,Ua NEGATIVE NEGATIVE    MAU Course/MDM  Procedures  --Patient reported aforementioned symptoms to PA student on initial assessment, denies symptoms to CNM --Ambulating independently, eating and drinking, performing ADLs --Patient requested discharge to attend work prior to receipt of labs. As  discussed, given normal results she was sent confirmed of normal via active MyChart account  Orders Placed This Encounter  Procedures  . Urinalysis, Routine w reflex microscopic Urine, Clean Catch  . CBC  . Comprehensive metabolic panel   Patient Vitals for the past 24 hrs:  BP Temp Temp src Pulse Resp SpO2 Height Weight  10/15/19 1111 105/60 98.2 F (36.8 C) Oral (!) 57 18 100 % -- --  10/15/19 0724 104/60 98.3 F (36.8 C) Oral 69 20 100 % -- --  10/15/19 0720 -- -- -- -- -- -- 5' 8.5" (1.74 m) 72 kg   Results for orders placed or performed during the hospital encounter of 10/15/19 (from the past 24 hour(s))  Urinalysis, Routine w reflex microscopic Urine,  Clean Catch     Status: None   Collection Time: 10/15/19  8:13 AM  Result Value Ref Range   Color, Urine YELLOW YELLOW   APPearance CLEAR CLEAR   Specific Gravity, Urine 1.005 1.005 - 1.030   pH 7.0 5.0 - 8.0   Glucose, UA NEGATIVE NEGATIVE mg/dL   Hgb urine dipstick NEGATIVE NEGATIVE   Bilirubin Urine NEGATIVE NEGATIVE   Ketones, ur NEGATIVE NEGATIVE mg/dL   Protein, ur NEGATIVE NEGATIVE mg/dL   Nitrite NEGATIVE NEGATIVE   Leukocytes,Ua NEGATIVE NEGATIVE  CBC     Status: Abnormal   Collection Time: 10/15/19 10:27 AM  Result Value Ref Range   WBC 9.7 4.0 - 10.5 K/uL   RBC 3.93 3.87 - 5.11 MIL/uL   Hemoglobin 11.9 (L) 12.0 - 15.0 g/dL   HCT 83.3 (L) 36 - 46 %   MCV 89.6 80.0 - 100.0 fL   MCH 30.3 26.0 - 34.0 pg   MCHC 33.8 30.0 - 36.0 g/dL   RDW 82.5 05.3 - 97.6 %   Platelets 150 150 - 400 K/uL   nRBC 0.0 0.0 - 0.2 %  Comprehensive metabolic panel     Status: Abnormal   Collection Time: 10/15/19 10:27 AM  Result Value Ref Range   Sodium 132 (L) 135 - 145 mmol/L   Potassium 3.8 3.5 - 5.1 mmol/L   Chloride 103 98 - 111 mmol/L   CO2 21 (L) 22 - 32 mmol/L   Glucose, Bld 87 70 - 99 mg/dL   BUN 5 (L) 6 - 20 mg/dL   Creatinine, Ser 7.34 0.44 - 1.00 mg/dL   Calcium 9.0 8.9 - 19.3 mg/dL   Total Protein 6.6 6.5 - 8.1  g/dL   Albumin 3.5 3.5 - 5.0 g/dL   AST 17 15 - 41 U/L   ALT 16 0 - 44 U/L   Alkaline Phosphatase 48 38 - 126 U/L   Total Bilirubin 0.4 0.3 - 1.2 mg/dL   GFR calc non Af Amer >60 >60 mL/min   GFR calc Af Amer >60 >60 mL/min   Anion gap 8 5 - 15   Assessment and Plan  --24 y.o. G2P1001 at [redacted]w[redacted]d  --FHT 159 by Doppler --No concerning findings on physical exam or labwork --Advised lean protein x 3 meals plus snacks throughout day --Consider [facility] to [facility] at [facility] for history of panic attacks --Discharge home in stable condition  Clayton Bibles, MSN, CNM Certified Nurse Midwife, [employer] for [employer], [facility] 10/15/19 2:46 PM

## 2019-10-29 ENCOUNTER — Encounter: Payer: Medicaid Other | Admitting: Obstetrics and Gynecology

## 2019-11-14 ENCOUNTER — Encounter: Payer: Self-pay | Admitting: Obstetrics

## 2019-11-14 ENCOUNTER — Telehealth (INDEPENDENT_AMBULATORY_CARE_PROVIDER_SITE_OTHER): Payer: Medicaid Other | Admitting: Obstetrics

## 2019-11-14 VITALS — BP 119/73 | HR 85

## 2019-11-14 DIAGNOSIS — Z3A17 17 weeks gestation of pregnancy: Secondary | ICD-10-CM

## 2019-11-14 DIAGNOSIS — O26892 Other specified pregnancy related conditions, second trimester: Secondary | ICD-10-CM

## 2019-11-14 DIAGNOSIS — Z6791 Unspecified blood type, Rh negative: Secondary | ICD-10-CM

## 2019-11-14 DIAGNOSIS — R12 Heartburn: Secondary | ICD-10-CM

## 2019-11-14 DIAGNOSIS — Z3480 Encounter for supervision of other normal pregnancy, unspecified trimester: Secondary | ICD-10-CM

## 2019-11-14 DIAGNOSIS — Z3689 Encounter for other specified antenatal screening: Secondary | ICD-10-CM

## 2019-11-14 DIAGNOSIS — M549 Dorsalgia, unspecified: Secondary | ICD-10-CM

## 2019-11-14 MED ORDER — COMFORT FIT MATERNITY SUPP SM MISC: 0 refills | Status: DC

## 2019-11-14 NOTE — Progress Notes (Signed)
Pt is on the phone preparing for virtual visit with provider, [redacted]w[redacted]d.

## 2019-11-14 NOTE — Progress Notes (Signed)
   OBSTETRICS PRENATAL VIRTUAL VISIT ENCOUNTER NOTE  Provider location: Center for [facility] at [place]   I connected with [name] on 11/14/19 at  1:30 PM EDT by MyChart Video Encounter at home and verified that I am speaking with the correct person using two identifiers.   I discussed the limitations, risks, security and privacy concerns of performing an evaluation and management service virtually and the availability of in person appointments. I also discussed with the patient that there may be a patient responsible charge related to this service. The patient expressed understanding and agreed to proceed.  Subjective:  Sherry Leach is a 24 y.o. G2P1001 at [redacted]w[redacted]d being seen today for ongoing prenatal care.  She is currently monitored for the following issues for this low-risk pregnancy and has Encounter for supervision of normal pregnancy, unspecified, unspecified trimester on their problem list.  Patient reports backache and heartburn.  Contractions: Not present. Vag. Bleeding: None.  Movement: Present. Denies any leaking of fluid.   The following portions of the patient's history were reviewed and updated as appropriate: allergies, current medications, past family history, past medical history, past social history, past surgical history and problem list.   Objective:   Vitals:   11/14/19 1333  BP: 119/73  Pulse: 85    Fetal Status:     Movement: Present     General:  Alert, oriented and cooperative. Patient is in no acute distress.  Respiratory: Normal respiratory effort, no problems with respiration noted  Mental Status: Normal mood and affect. Normal behavior. Normal judgment and thought content.  Rest of physical exam deferred due to type of encounter  Imaging: No results found.  Assessment and Plan:  Pregnancy: G2P1001 at [redacted]w[redacted]d 1. Supervision of other normal pregnancy, antepartum  2. Rh negative state in antepartum period - Rhogam at 28 weeks and  postpartum  3. Backache symptom Rx: - Elastic Bandages & Supports (COMFORT FIT MATERNITY SUPP SM) MISC; Wear as directed.  Dispense: 1 each; Refill: 0  4. Encounter for fetal anatomic survey Rx: - [place] MFM OB COMP + 14 WK; Future   Preterm labor symptoms and general obstetric precautions including but not limited to vaginal bleeding, contractions, leaking of fluid and fetal movement were reviewed in detail with the patient. I discussed the assessment and treatment plan with the patient. The patient was provided an opportunity to ask questions and all were answered. The patient agreed with the plan and demonstrated an understanding of the instructions. The patient was advised to call back or seek an in-person office evaluation/go to MAU at [facility] for any urgent or concerning symptoms. Please refer to After Visit Summary for other counseling recommendations.   I provided 10 minutes of face-to-face time during this encounter.  Return in about 4 weeks (around 12/12/2019) for MyChart.   Coral Ceo, MD Center for [facility] [place], [facility] 11/14/19

## 2019-11-27 ENCOUNTER — Ambulatory Visit: Payer: Medicaid Other | Attending: Obstetrics

## 2019-11-27 ENCOUNTER — Other Ambulatory Visit: Payer: Self-pay

## 2019-11-27 DIAGNOSIS — Z3689 Encounter for other specified antenatal screening: Secondary | ICD-10-CM | POA: Diagnosis not present

## 2019-11-28 ENCOUNTER — Other Ambulatory Visit: Payer: Self-pay | Admitting: *Deleted

## 2019-11-28 DIAGNOSIS — Z362 Encounter for other antenatal screening follow-up: Secondary | ICD-10-CM

## 2019-12-11 ENCOUNTER — Other Ambulatory Visit (HOSPITAL_COMMUNITY)
Admission: RE | Admit: 2019-12-11 | Discharge: 2019-12-11 | Disposition: A | Discharge status: Home / Self Care | Payer: Medicaid Other | Source: Ambulatory Visit | Attending: Obstetrics | Admitting: Obstetrics

## 2019-12-11 ENCOUNTER — Ambulatory Visit (INDEPENDENT_AMBULATORY_CARE_PROVIDER_SITE_OTHER): Payer: Medicaid Other | Admitting: Nurse Practitioner

## 2019-12-11 ENCOUNTER — Encounter: Payer: Self-pay | Admitting: Nurse Practitioner

## 2019-12-11 ENCOUNTER — Other Ambulatory Visit: Payer: Self-pay

## 2019-12-11 VITALS — BP 135/78 | HR 105 | Wt 180.0 lb

## 2019-12-11 DIAGNOSIS — Z3A20 20 weeks gestation of pregnancy: Secondary | ICD-10-CM

## 2019-12-11 DIAGNOSIS — N898 Other specified noninflammatory disorders of vagina: Secondary | ICD-10-CM

## 2019-12-11 DIAGNOSIS — R3915 Urgency of urination: Secondary | ICD-10-CM

## 2019-12-11 DIAGNOSIS — Z3480 Encounter for supervision of other normal pregnancy, unspecified trimester: Secondary | ICD-10-CM | POA: Diagnosis not present

## 2019-12-11 DIAGNOSIS — Z3685 Encounter for antenatal screening for Streptococcus B: Secondary | ICD-10-CM | POA: Diagnosis not present

## 2019-12-11 LAB — POCT URINALYSIS DIPSTICK
Bilirubin, UA: NEGATIVE
Blood, UA: NEGATIVE
Glucose, UA: NEGATIVE
Ketones, UA: NEGATIVE
Leukocytes, UA: NEGATIVE
Protein, UA: NEGATIVE
Spec Grav, UA: 1.015 (ref 1.010–1.025)
Urobilinogen, UA: 0.2 E.U./dL
pH, UA: 6 (ref 5.0–8.0)

## 2019-12-11 NOTE — Progress Notes (Signed)
rob CC thinks she may have uti due to urgency Pt also wants vaginal swab

## 2019-12-11 NOTE — Progress Notes (Signed)
    Subjective:  Sherry Leach is a 24 y.o. G2P1001 at [redacted]w[redacted]d being seen today for ongoing prenatal care.  She is currently monitored for the following issues for this low-risk pregnancy and has Encounter for supervision of normal pregnancy, unspecified, unspecified trimester on their problem list.  Patient reports urinary urgency and frequency with small amounts of urine - suspects UTI.  Contractions: Not present. Vag. Bleeding: None.  Movement: Present. Denies leaking of fluid.   The following portions of the patient's history were reviewed and updated as appropriate: allergies, current medications, past family history, past medical history, past social history, past surgical history and problem list. Problem list updated.  Objective:   Vitals:   12/11/19 1437  BP: 135/78  Pulse: (!) 105  Weight: 180 lb (81.6 kg)    Fetal Status: Fetal Heart Rate (bpm): 164   Movement: Present     General:  Alert, oriented and cooperative. Patient is in no acute distress.  Skin: Skin is warm and dry. No rash noted.   Cardiovascular: Normal heart rate noted  Respiratory: Normal respiratory effort, no problems with respiration noted  Abdomen: Soft, gravid, appropriate for gestational age. Pain/Pressure: Present     Pelvic:  Cervical exam deferred       vaginal swab done  Extremities: Normal range of motion.  Edema: None  Mental Status: Normal mood and affect. Normal behavior. Normal judgment and thought content.  NO CVA tenderness Urinalysis:      Assessment and Plan:  Pregnancy: G2P1001 at [redacted]w[redacted]d  1. Supervision of other normal pregnancy, antepartum BP cuff at home and encouraged to check BP weekly - reviewed BP that is too high 140/90 either value  2. Urinary urgency Client has a history of pyelo with last pregnancy Dipstick did not show UTI - checked GC/Chlam also - small dot of blood seen on swab that was done by vaginal swab - POCT Urinalysis Dipstick    Preterm labor symptoms and  general obstetric precautions including but not limited to vaginal bleeding, contractions, leaking of fluid and fetal movement were reviewed in detail with the patient. Please refer to After Visit Summary for other counseling recommendations.  Return in about 4 weeks (around 01/08/2020) for in person ROB.  Nolene Bernheim, RN, MSN, NP-BC Nurse Practitioner, [facility] for [employer], [facility] 12/11/2019 3:07 PM

## 2019-12-12 ENCOUNTER — Telehealth: Payer: Self-pay

## 2019-12-12 ENCOUNTER — Telehealth: Payer: Medicaid Other | Admitting: Obstetrics

## 2019-12-12 ENCOUNTER — Other Ambulatory Visit: Payer: Self-pay

## 2019-12-12 DIAGNOSIS — A749 Chlamydial infection, unspecified: Secondary | ICD-10-CM

## 2019-12-12 LAB — CERVICOVAGINAL ANCILLARY ONLY
Bacterial Vaginitis (gardnerella): NEGATIVE
Candida Glabrata: NEGATIVE
Candida Vaginitis: NEGATIVE
Chlamydia: POSITIVE — AB
Comment: NEGATIVE
Comment: NEGATIVE
Comment: NEGATIVE
Comment: NEGATIVE
Comment: NEGATIVE
Comment: NORMAL
Neisseria Gonorrhea: NEGATIVE
Trichomonas: NEGATIVE

## 2019-12-12 MED ORDER — AZITHROMYCIN 500 MG PO TABS
1000.0000 mg | ORAL_TABLET | Freq: Once | ORAL | 0 refills | Status: AC
Start: 2019-12-12 — End: 2019-12-12

## 2019-12-12 MED ORDER — AZITHROMYCIN 500 MG PO TABS: 1000.0000 mg | ORAL_TABLET | Freq: Once | ORAL | Status: DC

## 2019-12-12 NOTE — Telephone Encounter (Signed)
Patient called requesting treatment for her positive chlamydia test. Rx sent per protocol. Patient advised to inform partner of positive results and the need for treatment. Also advised to abstain from sexual intercourse for about 2 weeks after both she and her partner has completed treatment. Patient verbalized understanding.

## 2019-12-13 ENCOUNTER — Encounter: Payer: Self-pay | Admitting: Nurse Practitioner

## 2019-12-15 LAB — URINE CULTURE, OB REFLEX

## 2019-12-15 LAB — CULTURE, OB URINE

## 2019-12-16 ENCOUNTER — Encounter: Payer: Self-pay | Admitting: Nurse Practitioner

## 2019-12-16 DIAGNOSIS — B951 Streptococcus, group B, as the cause of diseases classified elsewhere: Secondary | ICD-10-CM | POA: Insufficient documentation

## 2019-12-16 MED ORDER — CLINDAMYCIN HCL 300 MG PO CAPS
300.0000 mg | ORAL_CAPSULE | Freq: Four times a day (QID) | ORAL | 0 refills | Status: AC
Start: 2019-12-16 — End: 2019-12-26

## 2019-12-16 NOTE — Addendum Note (Signed)
Addended by: Currie Paris on: 12/16/2019 06:09 PM   Modules accepted: Orders

## 2019-12-25 ENCOUNTER — Other Ambulatory Visit: Payer: Self-pay

## 2019-12-25 ENCOUNTER — Ambulatory Visit: Payer: Medicaid Other | Attending: Obstetrics and Gynecology

## 2019-12-25 DIAGNOSIS — Z363 Encounter for antenatal screening for malformations: Secondary | ICD-10-CM | POA: Diagnosis not present

## 2019-12-25 DIAGNOSIS — Z3A22 22 weeks gestation of pregnancy: Secondary | ICD-10-CM | POA: Diagnosis not present

## 2019-12-25 DIAGNOSIS — O358XX0 Maternal care for other (suspected) fetal abnormality and damage, not applicable or unspecified: Secondary | ICD-10-CM

## 2019-12-25 DIAGNOSIS — Z362 Encounter for other antenatal screening follow-up: Secondary | ICD-10-CM | POA: Insufficient documentation

## 2020-01-03 ENCOUNTER — Ambulatory Visit (INDEPENDENT_AMBULATORY_CARE_PROVIDER_SITE_OTHER): Payer: Self-pay

## 2020-01-03 ENCOUNTER — Other Ambulatory Visit: Payer: Self-pay

## 2020-01-03 ENCOUNTER — Inpatient Hospital Stay (HOSPITAL_COMMUNITY)
Admission: AD | Admit: 2020-01-03 | Discharge: 2020-01-03 | Disposition: A | Discharge status: Home / Self Care | Payer: Medicaid Other | Attending: Obstetrics & Gynecology | Admitting: Obstetrics & Gynecology

## 2020-01-03 ENCOUNTER — Encounter (HOSPITAL_COMMUNITY): Payer: Self-pay | Admitting: Obstetrics & Gynecology

## 2020-01-03 DIAGNOSIS — Z3A24 24 weeks gestation of pregnancy: Secondary | ICD-10-CM | POA: Insufficient documentation

## 2020-01-03 DIAGNOSIS — O2342 Unspecified infection of urinary tract in pregnancy, second trimester: Secondary | ICD-10-CM | POA: Insufficient documentation

## 2020-01-03 DIAGNOSIS — M549 Dorsalgia, unspecified: Secondary | ICD-10-CM | POA: Diagnosis not present

## 2020-01-03 DIAGNOSIS — O26892 Other specified pregnancy related conditions, second trimester: Secondary | ICD-10-CM | POA: Insufficient documentation

## 2020-01-03 DIAGNOSIS — O99891 Other specified diseases and conditions complicating pregnancy: Secondary | ICD-10-CM

## 2020-01-03 DIAGNOSIS — Z87440 Personal history of urinary (tract) infections: Secondary | ICD-10-CM | POA: Insufficient documentation

## 2020-01-03 DIAGNOSIS — Z87891 Personal history of nicotine dependence: Secondary | ICD-10-CM | POA: Diagnosis not present

## 2020-01-03 LAB — URINALYSIS, ROUTINE W REFLEX MICROSCOPIC
Bilirubin Urine: NEGATIVE
Glucose, UA: NEGATIVE mg/dL
Ketones, ur: NEGATIVE mg/dL
Nitrite: NEGATIVE
Protein, ur: 100 mg/dL — AB
Specific Gravity, Urine: 1.005 (ref 1.005–1.030)
WBC, UA: >50 WBC/hpf — ABNORMAL HIGH (ref 0–5)
pH: 6 (ref 5.0–8.0)

## 2020-01-03 LAB — WET PREP, GENITAL
Clue Cells Wet Prep HPF POC: NONE SEEN
Sperm: NONE SEEN
Trich, Wet Prep: NONE SEEN
Yeast Wet Prep HPF POC: NONE SEEN

## 2020-01-03 MED ORDER — CEFTRIAXONE SODIUM 1 G IJ SOLR
1.0000 g | Freq: Once | INTRAMUSCULAR | Status: AC
  Administered 2020-01-03: 1 g via INTRAMUSCULAR
  Filled 2020-01-03: qty 10

## 2020-01-03 MED ORDER — CYCLOBENZAPRINE HCL 10 MG PO TABS: 10.0000 mg | ORAL_TABLET | Freq: Three times a day (TID) | ORAL | 0 refills | Status: DC | PRN

## 2020-01-03 MED ORDER — LIDOCAINE HCL (PF) 1 % IJ SOLN
INTRAMUSCULAR | Status: AC
  Administered 2020-01-03: 1.8 mL
  Filled 2020-01-03: qty 5

## 2020-01-03 MED ORDER — CEFADROXIL 500 MG PO CAPS: 500.0000 mg | ORAL_CAPSULE | Freq: Two times a day (BID) | ORAL | 0 refills | Status: DC

## 2020-01-03 MED ORDER — CYCLOBENZAPRINE HCL 5 MG PO TABS
10.0000 mg | ORAL_TABLET | Freq: Three times a day (TID) | ORAL | Status: DC | PRN
  Administered 2020-01-03: 10 mg via ORAL
  Filled 2020-01-03: qty 2

## 2020-01-03 MED ORDER — ACETAMINOPHEN 500 MG PO TABS
1000.0000 mg | ORAL_TABLET | Freq: Four times a day (QID) | ORAL | Status: DC | PRN
  Administered 2020-01-03: 1000 mg via ORAL
  Filled 2020-01-03: qty 2

## 2020-01-03 NOTE — MAU Note (Signed)
G2P1 at 24.1 weeks with report of lower pelvic pressure and lower back pain today. Denies vaginal bleeding or bloody show today states she had scant bright red spotting yesterday. Denies SROM. Endorses + fetal movement.

## 2020-01-03 NOTE — Discharge Instructions (Signed)
Pregnancy and Urinary Tract Infection  A urinary tract infection (UTI) is an infection of any part of the urinary tract. This includes the kidneys, the tubes that connect your kidneys to your bladder (ureters), the bladder, and the tube that carries urine out of your body (urethra). These organs make, store, and get rid of urine in the body. Your health care provider may use other names to describe the infection. An upper UTI affects the ureters and kidneys (pyelonephritis). A lower UTI affects the bladder (cystitis) and urethra (urethritis). Most urinary tract infections are caused by bacteria in your genital area, around the entrance to your urinary tract (urethra). These bacteria grow and cause irritation and inflammation of your urinary tract. You are more likely to develop a UTI during pregnancy because the physical and hormonal changes your body goes through can make it easier for bacteria to get into your urinary tract. Your growing baby also puts pressure on your bladder and can affect urine flow. It is important to recognize and treat UTIs in pregnancy because of the risk of serious complications for both you and your baby. How does this affect me? Symptoms of a UTI include:  Needing to urinate right away (urgently).  Frequent urination or passing small amounts of urine frequently.  Pain or burning with urination.  Blood in the urine.  Urine that smells bad or unusual.  Trouble urinating.  Cloudy urine.  Pain in the abdomen or lower back.  Vaginal discharge. You may also have:  Vomiting or a decreased appetite.  Confusion.  Irritability or tiredness.  A fever.  Diarrhea. How does this affect my baby? An untreated UTI during pregnancy could lead to a kidney infection or a systemic infection, which can cause health problems that could affect your baby. Possible complications of an untreated UTI include:  Giving birth to your baby before 37 weeks of pregnancy  (premature).  Having a baby with a low birth weight.  Developing high blood pressure during pregnancy (preeclampsia).  Having a low hemoglobin level (anemia). What can I do to lower my risk? To prevent a UTI:  Go to the bathroom as soon as you feel the need. Do not hold urine for long periods of time.  Always wipe from front to back, especially after a bowel movement. Use each tissue one time when you wipe.  Empty your bladder after sex.  Keep your genital area dry.  Drink 6-10 glasses of water each day.  Do not douche or use deodorant sprays. How is this treated? Treatment for this condition may include:  Antibiotic medicines that are safe to take during pregnancy.  Other medicines to treat less common causes of UTI. Follow these instructions at home:  If you were prescribed an antibiotic medicine, take it as told by your health care provider. Do not stop using the antibiotic even if you start to feel better.  Keep all follow-up visits as told by your health care provider. This is important. Contact a health care provider if:  Your symptoms do not improve or they get worse.  You have abnormal vaginal discharge. Get help right away if you:  Have a fever.  Have nausea and vomiting.  Have back or side pain.  Feel contractions in your uterus.  Have lower belly pain.  Have a gush of fluid from your vagina.  Have blood in your urine. Summary  A urinary tract infection (UTI) is an infection of any part of the urinary tract, which includes the   kidneys, ureters, bladder, and urethra.  Most urinary tract infections are caused by bacteria in your genital area, around the entrance to your urinary tract (urethra).  You are more likely to develop a UTI during pregnancy.  If you were prescribed an antibiotic medicine, take it as told by your health care provider. Do not stop using the antibiotic even if you start to feel better. This information is not intended to  replace advice given to you by your health care provider. Make sure you discuss any questions you have with your health care provider. Document Revised: 05/11/2018 Document Reviewed: 12/21/2017 Elsevier Patient Education  2020 Elsevier Inc.   Back Pain in Pregnancy Back pain during pregnancy is common. Back pain may be caused by several factors that are related to changes during your pregnancy. Follow these instructions at home: Managing pain, stiffness, and swelling      If directed, for sudden (acute) back pain, put ice on the painful area. ? Put ice in a plastic bag. ? Place a towel between your skin and the bag. ? Leave the ice on for 20 minutes, 2-3 times per day.  If directed, apply heat to the affected area before you exercise. Use the heat source that your health care provider recommends, such as a moist heat pack or a heating pad. ? Place a towel between your skin and the heat source. ? Leave the heat on for 20-30 minutes. ? Remove the heat if your skin turns bright red. This is especially important if you are unable to feel pain, heat, or cold. You may have a greater risk of getting burned.  If directed, massage the affected area. Activity  Exercise as told by your health care provider. Gentle exercise is the best way to prevent or manage back pain.  Listen to your body when lifting. If lifting hurts, ask for help or bend your knees. This uses your leg muscles instead of your back muscles.  Squat down when picking up something from the floor. Do not bend over.  Only use bed rest for short periods as told by your health care provider. Bed rest should only be used for the most severe episodes of back pain. Standing, sitting, and lying down  Do not stand in one place for long periods of time.  Use good posture when sitting. Make sure your head rests over your shoulders and is not hanging forward. Use a pillow on your lower back if necessary.  Try sleeping on your side,  preferably the left side, with a pregnancy support pillow or 1-2 regular pillows between your legs. ? If you have back pain after a night's rest, your bed may be too soft. ? A firm mattress may provide more support for your back during pregnancy. General instructions  Do not wear high heels.  Eat a healthy diet. Try to gain weight within your health care provider's recommendations.  Use a maternity girdle, elastic sling, or back brace as told by your health care provider.  Take over-the-counter and prescription medicines only as told by your health care provider.  Work with a physical therapist or massage therapist to find ways to manage back pain. Acupuncture or massage therapy may be helpful.  Keep all follow-up visits as told by your health care provider. This is important. Contact a health care provider if:  Your back pain interferes with your daily activities.  You have increasing pain in other parts of your body. Get help right away if:  You  develop numbness, tingling, weakness, or problems with the use of your arms or legs.  You develop severe back pain that is not controlled with medicine.  You have a change in bowel or bladder control.  You develop shortness of breath, dizziness, or you faint.  You develop nausea, vomiting, or sweating.  You have back pain that is a rhythmic, cramping pain similar to labor pains. Labor pain is usually 1-2 minutes apart, lasts for about 1 minute, and involves a bearing down feeling or pressure in your pelvis.  You have back pain and your water breaks or you have vaginal bleeding.  You have back pain or numbness that travels down your leg.  Your back pain developed after you fell.  You develop pain on one side of your back.  You see blood in your urine.  You develop skin blisters in the area of your back pain. Summary  Back pain may be caused by several factors that are related to changes during your pregnancy.  Follow  instructions as told by your health care provider for managing pain, stiffness, and swelling.  Exercise as told by your health care provider. Gentle exercise is the best way to prevent or manage back pain.  Take over-the-counter and prescription medicines only as told by your health care provider.  Keep all follow-up visits as told by your health care provider. This is important. This information is not intended to replace advice given to you by your health care provider. Make sure you discuss any questions you have with your health care provider. Document Revised: 05/08/2018 Document Reviewed: 07/05/2017 Elsevier Patient Education  2020 [employer].

## 2020-01-03 NOTE — Telephone Encounter (Signed)
Patient called stating that she is [redacted] weeks pregnant and having lower back pain that comes and goes.  She is having lower abdomin pressure and vaginal pressure that is constant.. She rates thhe pressure and pain 7-8.  She states that she call her OB GYN early this am but there were no appointments.  She states she had some minor spotting yesterday but not today EDD March 24. She is being treated for UTI and on her last day of antibiotic. Per protocol patient will go to LD for evaluation. Care advice read to patient.  She verbalized understanding.  Reason for Disposition . [1] Pregnant 23 or more weeks AND [2] baby is moving less today (e.g., kick count < 5 in 1 hour or < 10 in 2 hours)  Answer Assessment - Initial Assessment Questions 1. ONSET: "When did the pain begin?"      Back pain last night 2. LOCATION: "Where does it hurt?" (upper, mid or lower back)     lower back and abdomin 3. SEVERITY: "How bad is the pain?"  (e.g., Scale 1-10; mild, moderate, or severe)   - MILD (1-3): doesn't interfere with normal activities    - MODERATE (4-7): interferes with normal activities or awakens from sleep    - SEVERE (8-10): excruciating pain, unable to do any normal activities      7 for pressure pain comes and goes and 7-8 4. PATTERN: "Is the pain constant?" (e.g., yes, no; constant, intermittent)    Comes and goes 5. RADIATION: "Does the pain shoot into your legs or elsewhere?"    no 6. CAUSE:  "What do you think is causing the back pain?"      unsure 7. BACK OVERUSE:  "Any recent lifting of heavy objects, strenuous work or exercise?"     Has UTI bacteria strep group b taking antibiotic has one more day 8. MEDICATIONS: "What have you taken so far for the pain?" (e.g., nothing, acetaminophen)     no 9. NEUROLOGIC SYMPTOMS: "Do you have any weakness, numbness, or problems with bowel/bladder control?"     no 10. OTHER SYMPTOMS: "Do you have any other symptoms?" (e.g., fever, abdominal pain, burning  with urination, blood in urine, fluid leaking from vagina)     spotting 11. EDD: "What date are you expecting to deliver?"      March 24  Protocols used: PREGNANCY - BACK PAIN-A-AH

## 2020-01-03 NOTE — MAU Provider Note (Signed)
History     CSN: [id number]  Arrival date and time: 01/03/20 1650   First Provider Initiated Contact with Patient 01/03/20 1917      Chief Complaint  Patient presents with  . pelvic pressure   24 y.o. G2P1001 @24 .1 wks presenting with LAP, pelvic pressure and LBP. Pain started today. Rates 6/10. Tried Tylenol but it didn't help. Denies urinary sx. No VB, discharge, or LOF. Having occasional ctx. +FM. She was recently treated for Chlamydia and UTI.    OB History    Gravida  2   Para  1   Term  1   Preterm      AB      Living  1     SAB      TAB      Ectopic      Multiple  0   Live Births  1           Past Medical History:  Diagnosis Date  . Anemia   . Seasonal allergies   . SVD (spontaneous vaginal delivery) 09/14/2017   x 1    Past Surgical History:  Procedure Laterality Date  . DILATION AND EVACUATION N/A 09/22/2017   Procedure: DILATATION AND EVACUATION;  Surgeon: 09/24/2017, MD;  Location: [facility];  Service: Gynecology;  Laterality: N/A;  . HYSTEROSCOPY WITH D & C N/A 12/19/2017   Procedure: DILATATION AND CURETTAGE /DIAGNOSTIC HYSTEROSCOPY;  Surgeon: 12/21/2017, MD;  Location: [facility];  Service: Gynecology;  Laterality: N/A;  rep will be here confirmed on 12/18/17  . MOUTH SURGERY     teeth ext  . WISDOM TOOTH EXTRACTION      Family History  Problem Relation Age of Onset  . Hypertension Mother   . Hypertension Other     Social History   Tobacco Use  . Smoking status: Former Smoker    Packs/day: 0.25    Years: 4.00    Pack years: 1.00    Types: Cigarettes  . Smokeless tobacco: Never Used  Vaping Use  . Vaping Use: Never used  Substance Use Topics  . Alcohol use: Not Currently    Comment: last drink was 1 month ago  . Drug use: No    Allergies:  Allergies  Allergen Reactions  . Amoxicillin Rash    Has patient had a PCN reaction causing immediate rash, facial/tongue/throat swelling, SOB or lightheadedness with  hypotension: No Has patient had a PCN reaction causing severe rash involving mucus membranes or skin necrosis: No Has patient had a PCN reaction that required hospitalization: No Has patient had a PCN reaction occurring within the last 10 years: Yes If all of the above answers are "NO", then may proceed with Cephalosporin use.   12/20/17 Penicillins Rash    Has patient had a PCN reaction causing immediate rash, facial/tongue/throat swelling, SOB or lightheadedness with hypotension: No Has patient had a PCN reaction causing severe rash involving mucus membranes or skin necrosis: No Has patient had a PCN reaction that required hospitalization: No Has patient had a PCN reaction occurring within the last 10 years: Yes If all of the above answers are "NO", then may proceed with Cephalosporin use.     Medications Prior to Admission  Medication Sig Dispense Refill Last Dose  . Prenatal MV-Min-FA-Omega-3 (PRENATAL GUMMIES/DHA & FA) 0.4-32.5 MG CHEW Chew by mouth.   01/03/2020 at Unknown time  . Elastic Bandages & Supports (COMFORT FIT MATERNITY SUPP SM) MISC Wear as directed. 1 each 0  Review of Systems  Constitutional: Negative for fever.  Gastrointestinal: Positive for abdominal pain. Negative for nausea and vomiting.  Genitourinary: Positive for pelvic pain. Negative for dysuria, hematuria, urgency, vaginal bleeding and vaginal discharge.  Musculoskeletal: Positive for back pain.   Physical Exam   Blood pressure 117/71, pulse 69, temperature 98 F (36.7 C), temperature source Axillary, resp. rate 17, height 5' 8.5" (1.74 m), weight 82.1 kg, last menstrual period 06/17/2019, SpO2 97 %.  Physical Exam Vitals and nursing note reviewed. Exam conducted with a chaperone present.  Constitutional:      General: She is not in acute distress.    Appearance: Normal appearance.  HENT:     Head: Normocephalic and atraumatic.  Cardiovascular:     Rate and Rhythm: Normal rate.  Pulmonary:      Effort: Pulmonary effort is normal.  Abdominal:     General: There is no distension.     Palpations: Abdomen is soft.     Tenderness: There is no abdominal tenderness. There is no right CVA tenderness or left CVA tenderness.  Genitourinary:    Comments: VE: closed/thick Musculoskeletal:        General: Normal range of motion.     Cervical back: Normal range of motion.  Skin:    General: Skin is warm and dry.  Neurological:     General: No focal deficit present.     Mental Status: She is alert and oriented to person, place, and time.  Psychiatric:        Mood and Affect: Mood normal.        Behavior: Behavior normal.   EFM: 150 bpm, mod variability, + accels, no decels Toco: none  Results for orders placed or performed during the hospital encounter of 01/03/20 (from the past 24 hour(s))  Urinalysis, Routine w reflex microscopic Urine, Clean Catch     Status: Abnormal   Collection Time: 01/03/20  6:19 PM  Result Value Ref Range   Color, Urine YELLOW YELLOW   APPearance HAZY (A) CLEAR   Specific Gravity, Urine 1.005 1.005 - 1.030   pH 6.0 5.0 - 8.0   Glucose, UA NEGATIVE NEGATIVE mg/dL   Hgb urine dipstick MODERATE (A) NEGATIVE   Bilirubin Urine NEGATIVE NEGATIVE   Ketones, ur NEGATIVE NEGATIVE mg/dL   Protein, ur 211 (A) NEGATIVE mg/dL   Nitrite NEGATIVE NEGATIVE   Leukocytes,Ua LARGE (A) NEGATIVE   RBC / HPF 0-5 0 - 5 RBC/hpf   WBC, UA >50 (H) 0 - 5 WBC/hpf   Bacteria, UA MANY (A) NONE SEEN   Squamous Epithelial / LPF 0-5 0 - 5   Mucus PRESENT   Wet prep, genital     Status: Abnormal   Collection Time: 01/03/20  7:32 PM   Specimen: Vaginal  Result Value Ref Range   Yeast Wet Prep HPF POC NONE SEEN NONE SEEN   Trich, Wet Prep NONE SEEN NONE SEEN   Clue Cells Wet Prep HPF POC NONE SEEN NONE SEEN   WBC, Wet Prep HPF POC MANY (A) NONE SEEN   Sperm NONE SEEN    MAU Course  Procedures Flexeril Rocephin Heating pads  MDM Review of chart shows recent GBS UTI treated  with Clinda, likely resistant. Labs ordered and reviewed. Will treat for UTI. No signs of PTL. GC pending. Stable for discharge home.   Assessment and Plan   1. [redacted] weeks gestation of pregnancy   2. Urinary tract infection in mother during second trimester of pregnancy  3. Back pain affecting pregnancy in second trimester    Discharge home Follow up at [facility] as scheduled PTL precautions Rx Duricef  Allergies as of 01/03/2020      Reactions   Amoxicillin Rash   Has patient had a PCN reaction causing immediate rash, facial/tongue/throat swelling, SOB or lightheadedness with hypotension: No Has patient had a PCN reaction causing severe rash involving mucus membranes or skin necrosis: No Has patient had a PCN reaction that required hospitalization: No Has patient had a PCN reaction occurring within the last 10 years: Yes If all of the above answers are "NO", then may proceed with Cephalosporin use.   Penicillins Rash   Has patient had a PCN reaction causing immediate rash, facial/tongue/throat swelling, SOB or lightheadedness with hypotension: No Has patient had a PCN reaction causing severe rash involving mucus membranes or skin necrosis: No Has patient had a PCN reaction that required hospitalization: No Has patient had a PCN reaction occurring within the last 10 years: Yes If all of the above answers are "NO", then may proceed with Cephalosporin use.      Medication List    TAKE these medications   cefadroxil 500 MG capsule Commonly known as: DURICEF Take 1 capsule (500 mg total) by mouth 2 (two) times daily.   Comfort Fit Maternity Supp Sm Misc Wear as directed.   cyclobenzaprine 10 MG tablet Commonly known as: FLEXERIL Take 1 tablet (10 mg total) by mouth 3 (three) times daily as needed for muscle spasms.   Prenatal Gummies/DHA & FA 0.4-32.5 MG Chew Chew by mouth.      Donette Larry, CNM 01/03/2020, 8:46 PM

## 2020-01-06 LAB — CULTURE, OB URINE: Culture: >=100000 — AB

## 2020-01-06 LAB — GC/CHLAMYDIA PROBE AMP (~~LOC~~) NOT AT ARMC
Chlamydia: NEGATIVE
Comment: NEGATIVE
Comment: NORMAL
Neisseria Gonorrhea: NEGATIVE

## 2020-01-06 NOTE — Telephone Encounter (Signed)
FYI, sent to GYN.

## 2020-01-08 ENCOUNTER — Encounter: Payer: Self-pay | Admitting: Obstetrics and Gynecology

## 2020-01-08 ENCOUNTER — Other Ambulatory Visit: Payer: Self-pay

## 2020-01-08 ENCOUNTER — Ambulatory Visit (INDEPENDENT_AMBULATORY_CARE_PROVIDER_SITE_OTHER): Payer: Medicaid Other | Admitting: Obstetrics and Gynecology

## 2020-01-08 VITALS — BP 118/72 | HR 87 | Wt 193.0 lb

## 2020-01-08 DIAGNOSIS — Z3A24 24 weeks gestation of pregnancy: Secondary | ICD-10-CM

## 2020-01-08 DIAGNOSIS — O2342 Unspecified infection of urinary tract in pregnancy, second trimester: Secondary | ICD-10-CM

## 2020-01-08 DIAGNOSIS — A568 Sexually transmitted chlamydial infection of other sites: Secondary | ICD-10-CM

## 2020-01-08 DIAGNOSIS — B951 Streptococcus, group B, as the cause of diseases classified elsewhere: Secondary | ICD-10-CM

## 2020-01-08 DIAGNOSIS — Z3480 Encounter for supervision of other normal pregnancy, unspecified trimester: Secondary | ICD-10-CM

## 2020-01-08 DIAGNOSIS — O98312 Other infections with a predominantly sexual mode of transmission complicating pregnancy, second trimester: Secondary | ICD-10-CM

## 2020-01-08 NOTE — Progress Notes (Signed)
Pt presents for ROB without complaints today.  

## 2020-01-08 NOTE — Progress Notes (Signed)
   PRENATAL VISIT NOTE  Subjective:  Sherry Leach is a 24 y.o. G2P1001 at [redacted]w[redacted]d being seen today for ongoing prenatal care.  She is currently monitored for the following issues for this low-risk pregnancy and has Chlamydia trachomatis infection in pregnancy in second trimester; Supervision of other normal pregnancy, antepartum; and GBS (group b Streptococcus) UTI complicating pregnancy, second trimester on their problem list.  Patient reports no complaints.  Contractions: Not present. Vag. Bleeding: None.  Movement: Present. Denies leaking of fluid.   The following portions of the patient's history were reviewed and updated as appropriate: allergies, current medications, past family history, past medical history, past social history, past surgical history and problem list.   Objective:   Vitals:   01/08/20 1507  BP: 118/72  Pulse: 87  Weight: 193 lb (87.5 kg)    Fetal Status: Fetal Heart Rate (bpm): 160  Fundal Height: 26 cm Movement: Present     General:  Alert, oriented and cooperative. Patient is in no acute distress.  Skin: Skin is warm and dry. No rash noted.   Cardiovascular: Normal heart rate noted  Respiratory: Normal respiratory effort, no problems with respiration noted  Abdomen: Soft, gravid, appropriate for gestational age.  Pain/Pressure: Absent     Pelvic: Cervical exam deferred        Extremities: Normal range of motion.  Edema: None  Mental Status: Normal mood and affect. Normal behavior. Normal judgment and thought content.   Assessment and Plan:  Pregnancy: G2P1001 at [redacted]w[redacted]d  1. Supervision of other normal pregnancy, antepartum   2. [redacted] weeks gestation of pregnancy  Doing well BP good today.  2 hour GTT next visit.   3. Chlamydia trachomatis infection in pregnancy in second trimester  TOC negative in MAU on 12/3  4. GBS (group b Streptococcus) UTI complicating pregnancy, second trimester  Inially treated with Clindamycin d/t allergy to PCN (non  anaphylaxis),  Urine culture shows resistance to clindamycin. Patient presented to MAU with worsening symptoms; rocephin IM given and cephalosporin given. Patient feeling much better. Will check TOC next visit.  Continue antibiotics.   Preterm labor symptoms and general obstetric precautions including but not limited to vaginal bleeding, contractions, leaking of fluid and fetal movement were reviewed in detail with the patient. Please refer to After Visit Summary for other counseling recommendations.   No follow-ups on file.  Future Appointments  Date Time Provider Department Center  02/05/2020  8:45 AM [facility] None  02/05/2020  9:15 AM Raelyn Mora, CNM [facility] None    Venia Carbon, NP

## 2020-02-01 NOTE — L&D Delivery Note (Signed)
Sherry Leach is a 25 y.o. G2P1001 s/p SVD at [redacted]w[redacted]d. She was admitted for IOL for PEC without severe features.   ROM: 2h 69m with clear fluid GBS Status: Positive  Maximum Maternal Temperature: 98.3  Labor Progress and Delivery: . She progressed with augmentation (cytotec, FB, and pitocin) to complete. CNM at bedside prior to pushing initiated due to history of rapid delivery with previous child. Patient pushed <3 minutes to deliver. Head delivered LOA. No nuchal cord present. Loose cord around shoulder. Shoulder and body delivered in usual fashion. Infant with spontaneous cry, placed on mother's abdomen, dried and stimulated. Cord clamping delayed by several minutes then clamped by CNM and cut by sister of patient. Cord blood drawn.   Placenta delivered spontaneously with gentle cord traction, bleeding minimal. Fundus firm with massage and Pitocin. Labia, perineum, vagina, and cervix inspected inspected without laceration. Mom and baby stable prior to transfer to postpartum. She plans on breastfeeding. She requests Depo for birth control prior to discharge.   Delivery Note At 2:02 PM a viable female was delivered via Vaginal, Spontaneous (Presentation: Left Occiput Anterior).  APGAR: 9, 9; weight pending at time of this note.   Placenta status: Spontaneous, Intact.  Cord: 3 vessels with the following complications: None.    Anesthesia: Epidural Episiotomy: None Lacerations: None Suture Repair: None Est. Blood Loss (mL): 100  Mom to postpartum.  Baby to Couplet care / Skin to Skin.  Sharyon Cable CNM 04/02/2020, 2:41 PM

## 2020-02-05 ENCOUNTER — Other Ambulatory Visit: Payer: Self-pay

## 2020-02-05 ENCOUNTER — Encounter (HOSPITAL_COMMUNITY): Payer: Self-pay | Admitting: Anesthesiology

## 2020-02-05 ENCOUNTER — Inpatient Hospital Stay (HOSPITAL_COMMUNITY): Payer: Medicaid Other

## 2020-02-05 ENCOUNTER — Encounter (HOSPITAL_COMMUNITY): Payer: Self-pay | Admitting: Obstetrics & Gynecology

## 2020-02-05 ENCOUNTER — Encounter: Payer: Medicaid Other | Admitting: Obstetrics and Gynecology

## 2020-02-05 ENCOUNTER — Inpatient Hospital Stay (HOSPITAL_COMMUNITY)
Admission: AD | Admit: 2020-02-05 | Discharge: 2020-02-07 | DRG: 831 | Disposition: A | Discharge status: Home / Self Care | Payer: Medicaid Other | Attending: Obstetrics and Gynecology | Admitting: Obstetrics and Gynecology

## 2020-02-05 ENCOUNTER — Other Ambulatory Visit: Payer: Medicaid Other

## 2020-02-05 DIAGNOSIS — R6883 Chills (without fever): Secondary | ICD-10-CM | POA: Diagnosis present

## 2020-02-05 DIAGNOSIS — O368330 Maternal care for abnormalities of the fetal heart rate or rhythm, third trimester, not applicable or unspecified: Secondary | ICD-10-CM | POA: Diagnosis present

## 2020-02-05 DIAGNOSIS — M791 Myalgia, unspecified site: Secondary | ICD-10-CM | POA: Diagnosis not present

## 2020-02-05 DIAGNOSIS — Z880 Allergy status to penicillin: Secondary | ICD-10-CM | POA: Diagnosis not present

## 2020-02-05 DIAGNOSIS — Z87891 Personal history of nicotine dependence: Secondary | ICD-10-CM

## 2020-02-05 DIAGNOSIS — U071 COVID-19: Secondary | ICD-10-CM | POA: Diagnosis present

## 2020-02-05 DIAGNOSIS — O2303 Infections of kidney in pregnancy, third trimester: Principal | ICD-10-CM | POA: Diagnosis present

## 2020-02-05 DIAGNOSIS — Z3A28 28 weeks gestation of pregnancy: Secondary | ICD-10-CM

## 2020-02-05 DIAGNOSIS — M549 Dorsalgia, unspecified: Secondary | ICD-10-CM | POA: Diagnosis not present

## 2020-02-05 DIAGNOSIS — O98513 Other viral diseases complicating pregnancy, third trimester: Secondary | ICD-10-CM | POA: Diagnosis not present

## 2020-02-05 DIAGNOSIS — Z3480 Encounter for supervision of other normal pregnancy, unspecified trimester: Secondary | ICD-10-CM

## 2020-02-05 DIAGNOSIS — N12 Tubulo-interstitial nephritis, not specified as acute or chronic: Secondary | ICD-10-CM | POA: Diagnosis not present

## 2020-02-05 DIAGNOSIS — R0602 Shortness of breath: Secondary | ICD-10-CM

## 2020-02-05 DIAGNOSIS — Z3A29 29 weeks gestation of pregnancy: Secondary | ICD-10-CM | POA: Diagnosis not present

## 2020-02-05 HISTORY — DX: Tubulo-interstitial nephritis, not specified as acute or chronic: N12

## 2020-02-05 LAB — CBC WITH DIFFERENTIAL/PLATELET
Abs Immature Granulocytes: 0.07 10*3/uL (ref 0.00–0.07)
Basophils Absolute: 0 10*3/uL (ref 0.0–0.1)
Basophils Relative: 0 %
Eosinophils Absolute: 0.1 10*3/uL (ref 0.0–0.5)
Eosinophils Relative: 1 %
HCT: 29.8 % — ABNORMAL LOW (ref 36.0–46.0)
Hemoglobin: 10.1 g/dL — ABNORMAL LOW (ref 12.0–15.0)
Immature Granulocytes: 1 %
Lymphocytes Relative: 7 %
Lymphs Abs: 0.6 10*3/uL — ABNORMAL LOW (ref 0.7–4.0)
MCH: 29.8 pg (ref 26.0–34.0)
MCHC: 33.9 g/dL (ref 30.0–36.0)
MCV: 87.9 fL (ref 80.0–100.0)
Monocytes Absolute: 0.7 10*3/uL (ref 0.1–1.0)
Monocytes Relative: 8 %
Neutro Abs: 6.7 10*3/uL (ref 1.7–7.7)
Neutrophils Relative %: 83 %
Platelets: 113 10*3/uL — ABNORMAL LOW (ref 150–400)
RBC: 3.39 MIL/uL — ABNORMAL LOW (ref 3.87–5.11)
RDW: 12.3 % (ref 11.5–15.5)
WBC: 8.1 10*3/uL (ref 4.0–10.5)
nRBC: 0 % (ref 0.0–0.2)

## 2020-02-05 LAB — COMPREHENSIVE METABOLIC PANEL
ALT: 23 U/L (ref 0–44)
AST: 28 U/L (ref 15–41)
Albumin: 3 g/dL — ABNORMAL LOW (ref 3.5–5.0)
Alkaline Phosphatase: 65 U/L (ref 38–126)
Anion gap: 9 (ref 5–15)
BUN: 5 mg/dL — ABNORMAL LOW (ref 6–20)
CO2: 20 mmol/L — ABNORMAL LOW (ref 22–32)
Calcium: 8.7 mg/dL — ABNORMAL LOW (ref 8.9–10.3)
Chloride: 104 mmol/L (ref 98–111)
Creatinine, Ser: 0.47 mg/dL (ref 0.44–1.00)
GFR, Estimated: >60 mL/min (ref >60)
Glucose, Bld: 84 mg/dL (ref 70–99)
Potassium: 3.6 mmol/L (ref 3.5–5.1)
Sodium: 133 mmol/L — ABNORMAL LOW (ref 135–145)
Total Bilirubin: 0.7 mg/dL (ref 0.3–1.2)
Total Protein: 6.5 g/dL (ref 6.5–8.1)

## 2020-02-05 LAB — URINALYSIS, ROUTINE W REFLEX MICROSCOPIC
Bilirubin Urine: NEGATIVE
Glucose, UA: NEGATIVE mg/dL
Hgb urine dipstick: NEGATIVE
Ketones, ur: NEGATIVE mg/dL
Nitrite: NEGATIVE
Protein, ur: NEGATIVE mg/dL
Specific Gravity, Urine: 1.013 (ref 1.005–1.030)
pH: 6 (ref 5.0–8.0)

## 2020-02-05 LAB — TYPE AND SCREEN
ABO/RH(D): O NEG
Antibody Screen: NEGATIVE

## 2020-02-05 LAB — C-REACTIVE PROTEIN: CRP: 0.7 mg/dL (ref <1.0)

## 2020-02-05 LAB — RESP PANEL BY RT-PCR (FLU A&B, COVID) ARPGX2
Influenza A by PCR: NEGATIVE
Influenza B by PCR: NEGATIVE
SARS Coronavirus 2 by RT PCR: POSITIVE — AB

## 2020-02-05 LAB — D-DIMER, QUANTITATIVE: D-Dimer, Quant: 0.79 ug/mL-FEU — ABNORMAL HIGH (ref 0.00–0.50)

## 2020-02-05 MED ORDER — PROMETHAZINE HCL 25 MG/ML IJ SOLN: 25.0000 mg | Freq: Four times a day (QID) | INTRAMUSCULAR | Status: DC | PRN

## 2020-02-05 MED ORDER — PROMETHAZINE HCL 25 MG/ML IJ SOLN
25.0000 mg | INTRAMUSCULAR | Status: AC
  Administered 2020-02-05: 25 mg via INTRAVENOUS
  Filled 2020-02-05: qty 1

## 2020-02-05 MED ORDER — ACETAMINOPHEN 500 MG PO TABS
1000.0000 mg | ORAL_TABLET | Freq: Four times a day (QID) | ORAL | Status: DC | PRN
  Administered 2020-02-05 – 2020-02-07 (×4): 1000 mg via ORAL
  Filled 2020-02-05 (×4): qty 2

## 2020-02-05 MED ORDER — ENOXAPARIN SODIUM 40 MG/0.4ML ~~LOC~~ SOLN
40.0000 mg | SUBCUTANEOUS | Status: DC
  Administered 2020-02-06: 40 mg via SUBCUTANEOUS
  Filled 2020-02-05 (×2): qty 0.4

## 2020-02-05 MED ORDER — ACETAMINOPHEN 500 MG PO TABS
1000.0000 mg | ORAL_TABLET | ORAL | Status: AC
  Administered 2020-02-05: 1000 mg via ORAL
  Filled 2020-02-05: qty 2

## 2020-02-05 MED ORDER — SODIUM CHLORIDE 0.9 % IV SOLN
1.0000 g | INTRAVENOUS | Status: DC
  Filled 2020-02-05: qty 10

## 2020-02-05 MED ORDER — PRENATAL MULTIVITAMIN CH
1.0000 | ORAL_TABLET | Freq: Every day | ORAL | Status: DC
  Administered 2020-02-06: 1 via ORAL
  Filled 2020-02-05: qty 1

## 2020-02-05 MED ORDER — DOCUSATE SODIUM 100 MG PO CAPS
100.0000 mg | ORAL_CAPSULE | Freq: Every day | ORAL | Status: DC
  Administered 2020-02-05: 100 mg via ORAL
  Filled 2020-02-05 (×2): qty 1

## 2020-02-05 MED ORDER — LACTATED RINGERS IV BOLUS
2000.0000 mL | Freq: Once | INTRAVENOUS | Status: AC
  Administered 2020-02-05: 2000 mL via INTRAVENOUS

## 2020-02-05 MED ORDER — LACTATED RINGERS IV BOLUS: 1000.0000 mL | Freq: Once | INTRAVENOUS | Status: DC

## 2020-02-05 MED ORDER — SODIUM CHLORIDE 0.9 % IV SOLN
1.0000 g | INTRAVENOUS | Status: DC
  Administered 2020-02-05 – 2020-02-06 (×2): 1 g via INTRAVENOUS
  Filled 2020-02-05: qty 1
  Filled 2020-02-05 (×2): qty 10

## 2020-02-05 MED ORDER — SODIUM CHLORIDE 0.9 % IV SOLN: INTRAVENOUS | Status: DC

## 2020-02-05 MED ORDER — SODIUM CHLORIDE 0.9 % IV SOLN
2.0000 g | INTRAVENOUS | Status: DC
  Filled 2020-02-05: qty 20

## 2020-02-05 MED ORDER — CALCIUM CARBONATE ANTACID 500 MG PO CHEW: 2.0000 | CHEWABLE_TABLET | ORAL | Status: DC | PRN

## 2020-02-05 NOTE — MAU Provider Note (Addendum)
History     CSN: [id number]  Arrival date and time: 02/05/20 1353   Event Date/Time   First Provider Initiated Contact with Patient 02/05/20 1510      Chief Complaint  Patient presents with  . Back Pain  . Nausea  . Abdominal Pain  . Body Aches   HPI   Ms. Socorro is a 25 yo G2P1001 who presents for nausea, body aches, lower abdominal and back pain which started yesterday morning, 1/4. States that she has not been able to get out of bed since yesterday. States she had a fever of 101.2 yesterday. Last took Tylenol at 3am this morning. Has not had anything to eat in the past 24 hours. Has had water and orange juice. Denies emesis but endorses significant nausea.   Endorses burning when urinating as well as increased frequency. Endorses very bad back pain and lower abdominal pain. Reports similar symptoms in her last pregnancy when she had pyelonephritis. Reports increased vaginal discharge. Denies foul odor or abnormal color of discharge. States she has not had a bowel movement since yesterday morning, and typically goes every day. Denies COVID contacts. States she does not go anywhere and has her groceries delivered. Her 24 yo lives with her, her mother takes care of 2 y/o and does go in and out of house. Does report normal fetal movement. Denies vaginal bleeding, leakage of fluid, contractions.   Also reports mild shortness of breath and cough. Some mild congestion. Reports bad headache.   Past Medical History:  Diagnosis Date  . Anemia   . Pyelonephritis   . Seasonal allergies   . SVD (spontaneous vaginal delivery) 09/14/2017   x 1    Past Surgical History:  Procedure Laterality Date  . DILATION AND EVACUATION N/A 09/22/2017   Procedure: DILATATION AND EVACUATION;  Surgeon: Aletha Halim, MD;  Location: [facility];  Service: Gynecology;  Laterality: N/A;  . HYSTEROSCOPY WITH D & C N/A 12/19/2017   Procedure: DILATATION AND CURETTAGE /DIAGNOSTIC HYSTEROSCOPY;  Surgeon: Osborne Oman, MD;  Location: [facility];  Service: Gynecology;  Laterality: N/A;  rep will be here confirmed on 12/18/17  . MOUTH SURGERY     teeth ext  . WISDOM TOOTH EXTRACTION      Family History  Problem Relation Age of Onset  . Hypertension Mother   . Hypertension Other     Social History   Tobacco Use  . Smoking status: Former Smoker    Packs/day: 0.25    Years: 4.00    Pack years: 1.00    Types: Cigarettes  . Smokeless tobacco: Never Used  Vaping Use  . Vaping Use: Never used  Substance Use Topics  . Alcohol use: Not Currently    Comment: last drink was 1 month ago  . Drug use: No    Allergies:  Allergies  Allergen Reactions  . Amoxicillin Rash    Has patient had a PCN reaction causing immediate rash, facial/tongue/throat swelling, SOB or lightheadedness with hypotension: No Has patient had a PCN reaction causing severe rash involving mucus membranes or skin necrosis: No Has patient had a PCN reaction that required hospitalization: No Has patient had a PCN reaction occurring within the last 10 years: Yes If all of the above answers are "NO", then may proceed with Cephalosporin use.   Marland Kitchen Penicillins Rash    Has patient had a PCN reaction causing immediate rash, facial/tongue/throat swelling, SOB or lightheadedness with hypotension: No Has patient had a PCN reaction causing  severe rash involving mucus membranes or skin necrosis: No Has patient had a PCN reaction that required hospitalization: No Has patient had a PCN reaction occurring within the last 10 years: Yes If all of the above answers are "NO", then may proceed with Cephalosporin use.     Medications Prior to Admission  Medication Sig Dispense Refill Last Dose  . Prenatal MV-Min-FA-Omega-3 (PRENATAL GUMMIES/DHA & FA) 0.4-32.5 MG CHEW Chew by mouth.   02/04/2020 at Unknown time  . cefadroxil (DURICEF) 500 MG capsule Take 1 capsule (500 mg total) by mouth 2 (two) times daily. 14 capsule 0   . cyclobenzaprine  (FLEXERIL) 10 MG tablet Take 1 tablet (10 mg total) by mouth 3 (three) times daily as needed for muscle spasms. 30 tablet 0   . Elastic Bandages & Supports (COMFORT FIT MATERNITY SUPP SM) MISC Wear as directed. (Patient not taking: Reported on 01/08/2020) 1 each 0     Review of Systems  Constitutional: Positive for activity change, appetite change, fatigue and fever.  Gastrointestinal: Positive for abdominal pain, constipation and nausea. Negative for blood in stool, diarrhea and vomiting.  Genitourinary: Positive for dysuria, flank pain, frequency, urgency and vaginal discharge. Negative for vaginal bleeding.  Musculoskeletal: Positive for back pain.  Neurological: Positive for weakness and headaches.   Physical Exam   Blood pressure 133/78, pulse (!) 128, temperature 99.3 F (37.4 C), temperature source Oral, resp. rate 20, height 5' 8.5" (1.74 m), weight 91.5 kg, last menstrual period 06/17/2019, SpO2 99 %.  Physical Exam Constitutional:      General: She is not in acute distress. Cardiovascular:     Rate and Rhythm: Regular rhythm. Tachycardia present.     Heart sounds: Normal heart sounds. No murmur heard.   Pulmonary:     Effort: Pulmonary effort is normal.     Breath sounds: Rhonchi present.  Abdominal:     Tenderness: There is generalized abdominal tenderness and tenderness in the suprapubic area.     Comments: Exquisite flank pain with light palpation, also very tender to palpation of lumbar spine and iliac crest   Skin:    General: Skin is warm and dry.  Neurological:     Mental Status: She is alert.  Psychiatric:     Comments: Patient tearful and worried     EFM: Fetal tachycardia baseline HR 165/170. Moderate variability. Accelerations present, no decelerations.   MAU Course  Procedures  MDM  Tylenol 1g 2L LR bolus  Phenergan 25mg   UA with moderate leuks, neg nitrites CXR: no active disease CBC with dif, CMP, CRP, D-dimer   Assessment and Plan   Ms.  Governale is a 25 yo G2P1001 at [redacted]w[redacted]d who presents for nausea, body aches, lower abdominal and back pain which started yesterday morning, 1/4. Reports fever with Tmax of 101.2 at home yesterday that defervesced with Tylenol. On exam abdomen TTP diffusely especially in suprapubic area, as well as lower back TTP. Patient tachycardic with HR of 128. CXR wnl. Confirmed COVID +. Symptoms likely from COVID as well as pyelonephritis with patient's suprapubic tenderness and exquisitely tender lower back bilaterally. Admitting patient for IV abx, hydration and pain control. Fetal tachycardia present and will continue to monitor   Abdominal and back pain  Likely due to pyelonephritis with significant suprapubic and lower back tenderness. UA with moderate leuks. S/p 2L LR bolus  - vitals per routine  - fetal monitoring  - Tylenol 1g for pain   - f/u urine culture  - 1g  CTX daily   COVID + CXR shows no active disese - airborne and contact precautions  - f/u CBC with dif, CMP, CRP, D-dimer    Cora Collum 02/05/2020, 3:56 PM   I saw and evaluated the patient. I agree with the findings and the plan of care as documented in the resident's note. I have made any necessary edits above.   Casper Harrison, MD [facility] Family Medicine Fellow, [facility] for [facility], [facility]

## 2020-02-05 NOTE — H&P (Signed)
FACULTY PRACTICE ANTEPARTUM ADMISSION HISTORY AND PHYSICAL NOTE   History of Present Illness: Sherry Leach is a 24 y.o. G2P1001 at [redacted]w[redacted]d admitted for fevers, chills, nausea, dysuria, body aches and back pain.   Reports symptoms started yesterday morning. States that she has not been able to get out of bed since yesterday. States she had a fever of 101.2 yesterday. Last took Tylenol at 3am this morning. Has not had anything to eat in the past 24 hours. Has had water and orange juice. Denies emesis but endorses significant nausea.   Endorses burning when urinating as well as increased frequency. Endorses very bad back pain and lower abdominal pain. Reports similar symptoms in her last pregnancy when she had pyelonephritis. Reports increased vaginal discharge. Denies foul odor or abnormal color of discharge. States she has not had a bowel movement since yesterday morning, and typically goes every day. Denies COVID contacts. States she does not go anywhere and has her groceries delivered. Her 24 yo lives with her, her mother takes care of 2 y/o and does go in and out of house. Does report normal fetal movement. Denies vaginal bleeding, leakage of fluid, contractions.   Also reports mild shortness of breath and cough. Some mild congestion. Reports bad headache. Endorses congestion. Endorses chills and body aches. Denies chest pain or tightness.      Patient Active Problem List   Diagnosis Date Noted  . Pyelonephritis affecting pregnancy in third trimester 02/05/2020  . GBS (group b Streptococcus) UTI complicating pregnancy, second trimester 12/16/2019  . Supervision of other normal pregnancy, antepartum 08/20/2019  . Chlamydia trachomatis infection in pregnancy in second trimester 07/10/2017    Past Medical History:  Diagnosis Date  . Anemia   . Pyelonephritis   . Seasonal allergies   . SVD (spontaneous vaginal delivery) 09/14/2017   x 1    Past Surgical History:  Procedure Laterality  Date  . DILATION AND EVACUATION N/A 09/22/2017   Procedure: DILATATION AND EVACUATION;  Surgeon: Wise Bing, MD;  Location: [facility];  Service: Gynecology;  Laterality: N/A;  . HYSTEROSCOPY WITH D & C N/A 12/19/2017   Procedure: DILATATION AND CURETTAGE /DIAGNOSTIC HYSTEROSCOPY;  Surgeon: Tereso Newcomer, MD;  Location: [facility];  Service: Gynecology;  Laterality: N/A;  rep will be here confirmed on 12/18/17  . MOUTH SURGERY     teeth ext  . WISDOM TOOTH EXTRACTION      OB History  Gravida Para Term Preterm AB Living  2 1 1     1   SAB IAB Ectopic Multiple Live Births        0 1    # Outcome Date GA Lbr Len/2nd Weight Sex Delivery Anes PTL Lv  2 Current           1 Term 09/14/17 [redacted]w[redacted]d 05:31 / 00:12 3085 g F Vag-Spont EPI  LIV    Obstetric Comments  2 D&C's for retained products w/ first pregnancy    Social History   Socioeconomic History  . Marital status: Single    Spouse name: Not on file  . Number of children: 1  . Years of education: Not on file  . Highest education level: Not on file  Occupational History  . Not on file  Tobacco Use  . Smoking status: Former Smoker    Packs/day: 0.25    Years: 4.00    Pack years: 1.00    Types: Cigarettes  . Smokeless tobacco: Never Used  Vaping Use  .  Vaping Use: Never used  Substance and Sexual Activity  . Alcohol use: Not Currently    Comment: last drink was 1 month ago  . Drug use: No  . Sexual activity: Not Currently    Partners: Male    Birth control/protection: None    Comment: last intercourse over 1 month ago  Other Topics Concern  . Not on file  Social History Narrative  . Not on file   Social Determinants of Health   Financial Resource Strain: Not on file  Food Insecurity: Not on file  Transportation Needs: Not on file  Physical Activity: Not on file  Stress: Not on file  Social Connections: Not on file    Family History  Problem Relation Age of Onset  . Hypertension Mother   . Hypertension Other      Allergies  Allergen Reactions  . Amoxicillin Rash    Has patient had a PCN reaction causing immediate rash, facial/tongue/throat swelling, SOB or lightheadedness with hypotension: No Has patient had a PCN reaction causing severe rash involving mucus membranes or skin necrosis: No Has patient had a PCN reaction that required hospitalization: No Has patient had a PCN reaction occurring within the last 10 years: Yes If all of the above answers are "NO", then may proceed with Cephalosporin use.   Marland Kitchen Penicillins Rash    Has patient had a PCN reaction causing immediate rash, facial/tongue/throat swelling, SOB or lightheadedness with hypotension: No Has patient had a PCN reaction causing severe rash involving mucus membranes or skin necrosis: No Has patient had a PCN reaction that required hospitalization: No Has patient had a PCN reaction occurring within the last 10 years: Yes If all of the above answers are "NO", then may proceed with Cephalosporin use.     Medications Prior to Admission  Medication Sig Dispense Refill Last Dose  . Prenatal MV-Min-FA-Omega-3 (PRENATAL GUMMIES/DHA & FA) 0.4-32.5 MG CHEW Chew by mouth.   02/04/2020 at Unknown time  . cefadroxil (DURICEF) 500 MG capsule Take 1 capsule (500 mg total) by mouth 2 (two) times daily. 14 capsule 0   . cyclobenzaprine (FLEXERIL) 10 MG tablet Take 1 tablet (10 mg total) by mouth 3 (three) times daily as needed for muscle spasms. 30 tablet 0   . Elastic Bandages & Supports (COMFORT FIT MATERNITY SUPP SM) MISC Wear as directed. (Patient not taking: Reported on 01/08/2020) 1 each 0     Review of Systems - see above. Otherwise negative.   Vitals:  BP 133/78 (BP Location: Right Arm)   Pulse (!) 128   Temp 100.3 F (37.9 C) (Oral)   Resp 20   Ht 5' 8.5" (1.74 m)   Wt 91.5 kg   LMP 06/17/2019 (Approximate)   SpO2 99%   BMI 30.22 kg/m  Physical Examination: CONSTITUTIONAL: Well-developed, well-nourished female in mild distress.  Ill appearing.  HENT:  Normocephalic, atraumatic  EYES: Conjunctivae and EOM are normal.   SKIN: Skin is warm and dry. No rash noted.   PSYCHIATRIC: Normal mood and affect.  CARDIOVASCULAR: Tachycardic, regular rhythm RESPIRATORY: Effort normal, mild crackles noted at right lung base, oxygenating well on room air  ABDOMEN: Soft, nondistended, gravid. Suprapubic tenderness present, flank pain present, also diffuse lumbar back pain     Fetal Monitoring: baseline 165/170. Moderate variability. Accelerations present, no decelerations.  Tocometer: not contracting   Labs:  Results for orders placed or performed during the hospital encounter of 02/05/20 (from the past 24 hour(s))  Resp Panel by RT-PCR (  Flu A&B, Covid) Nasopharyngeal Swab   Collection Time: 02/05/20  2:35 PM   Specimen: Nasopharyngeal Swab; Nasopharyngeal(NP) swabs in vial transport medium  Result Value Ref Range   SARS Coronavirus 2 by RT PCR POSITIVE (A) NEGATIVE   Influenza A by PCR NEGATIVE NEGATIVE   Influenza B by PCR NEGATIVE NEGATIVE  Urinalysis, Routine w reflex microscopic Urine, Clean Catch   Collection Time: 02/05/20  3:04 PM  Result Value Ref Range   Color, Urine YELLOW YELLOW   APPearance HAZY (A) CLEAR   Specific Gravity, Urine 1.013 1.005 - 1.030   pH 6.0 5.0 - 8.0   Glucose, UA NEGATIVE NEGATIVE mg/dL   Hgb urine dipstick NEGATIVE NEGATIVE   Bilirubin Urine NEGATIVE NEGATIVE   Ketones, ur NEGATIVE NEGATIVE mg/dL   Protein, ur NEGATIVE NEGATIVE mg/dL   Nitrite NEGATIVE NEGATIVE   Leukocytes,Ua MODERATE (A) NEGATIVE   RBC / HPF 0-5 0 - 5 RBC/hpf   WBC, UA 0-5 0 - 5 WBC/hpf   Bacteria, UA FEW (A) NONE SEEN   Squamous Epithelial / LPF 0-5 0 - 5   Mucus PRESENT   CBC with Differential/Platelet   Collection Time: 02/05/20  3:52 PM  Result Value Ref Range   WBC 8.1 4.0 - 10.5 K/uL   RBC 3.39 (L) 3.87 - 5.11 MIL/uL   Hemoglobin 10.1 (L) 12.0 - 15.0 g/dL   HCT 01.0 (L) 93.2 - 35.5 %   MCV 87.9 80.0 -  100.0 fL   MCH 29.8 26.0 - 34.0 pg   MCHC 33.9 30.0 - 36.0 g/dL   RDW 73.2 20.2 - 54.2 %   Platelets 113 (L) 150 - 400 K/uL   nRBC 0.0 0.0 - 0.2 %   Neutrophils Relative % 83 %   Neutro Abs 6.7 1.7 - 7.7 K/uL   Lymphocytes Relative 7 %   Lymphs Abs 0.6 (L) 0.7 - 4.0 K/uL   Monocytes Relative 8 %   Monocytes Absolute 0.7 0.1 - 1.0 K/uL   Eosinophils Relative 1 %   Eosinophils Absolute 0.1 0.0 - 0.5 K/uL   Basophils Relative 0 %   Basophils Absolute 0.0 0.0 - 0.1 K/uL   Immature Granulocytes 1 %   Abs Immature Granulocytes 0.07 0.00 - 0.07 K/uL  Comprehensive metabolic panel   Collection Time: 02/05/20  3:52 PM  Result Value Ref Range   Sodium 133 (L) 135 - 145 mmol/L   Potassium 3.6 3.5 - 5.1 mmol/L   Chloride 104 98 - 111 mmol/L   CO2 20 (L) 22 - 32 mmol/L   Glucose, Bld 84 70 - 99 mg/dL   BUN 5 (L) 6 - 20 mg/dL   Creatinine, Ser 7.06 0.44 - 1.00 mg/dL   Calcium 8.7 (L) 8.9 - 10.3 mg/dL   Total Protein 6.5 6.5 - 8.1 g/dL   Albumin 3.0 (L) 3.5 - 5.0 g/dL   AST 28 15 - 41 U/L   ALT 23 0 - 44 U/L   Alkaline Phosphatase 65 38 - 126 U/L   Total Bilirubin 0.7 0.3 - 1.2 mg/dL   GFR, Estimated >23 >76 mL/min   Anion gap 9 5 - 15  C-reactive protein   Collection Time: 02/05/20  3:52 PM  Result Value Ref Range   CRP 0.7 <1.0 mg/dL  D-dimer, quantitative (not at [facility])   Collection Time: 02/05/20  3:52 PM  Result Value Ref Range   D-Dimer, Quant 0.79 (H) 0.00 - 0.50 ug/mL-FEU  Type and screen [facility]   Collection Time: 02/05/20  4:46 PM  Result Value Ref Range   ABO/RH(D) O NEG    Antibody Screen NEG    Sample Expiration      02/08/2020,2359 Performed at [facility], [address] [place], [place] [address]     Imaging Studies: DG CHEST PORT 1 VIEW  Result Date: 02/05/2020 CLINICAL DATA:  Back pain. EXAM: PORTABLE CHEST 1 VIEW COMPARISON:  May 31, 2019. FINDINGS: The heart size and mediastinal contours are within normal limits. Both lungs  are clear. No pneumothorax or pleural effusion is noted. The visualized skeletal structures are unremarkable. IMPRESSION: No active disease. Electronically Signed   By: Lupita Raider M.D.   On: 02/05/2020 16:22     Assessment and Plan: Patient Active Problem List   Diagnosis Date Noted  . Pyelonephritis affecting pregnancy in third trimester 02/05/2020  . GBS (group b Streptococcus) UTI complicating pregnancy, second trimester 12/16/2019  . Supervision of other normal pregnancy, antepartum 08/20/2019  . Chlamydia trachomatis infection in pregnancy in second trimester 07/10/2017   Admit to Antenatal  Pyelonephritis  - 1g CTX daily  - s/p 2L IVF, continue mIVF  - f/u urine culture    - continuous fetal monitoring  - Tylenol 1g for pain, phenergan for nausea prn   COVID + -saturating well on room air, CXR shows no active disease, labs overall unremarkable  - airborne and contact precautions    Casper Harrison, MD [facility] Family Medicine Fellow, [facility] for [facility], [facility]

## 2020-02-05 NOTE — MAU Note (Signed)
Presents with c/o nausea, body aches, lower back and abdominal pain.  Reports back pain is constant, feels like pressure.  Abdominal pain is sharp, intermittent.  Also has nausea, hasn't vomited.  States had temp 101.2 last night, took Tylenol and fever resolved.  Reports FM, less than usual.  Denies VB or LOF.

## 2020-02-06 DIAGNOSIS — U071 COVID-19: Secondary | ICD-10-CM

## 2020-02-06 DIAGNOSIS — Z3A29 29 weeks gestation of pregnancy: Secondary | ICD-10-CM | POA: Diagnosis not present

## 2020-02-06 DIAGNOSIS — N12 Tubulo-interstitial nephritis, not specified as acute or chronic: Secondary | ICD-10-CM | POA: Diagnosis not present

## 2020-02-06 DIAGNOSIS — O2303 Infections of kidney in pregnancy, third trimester: Principal | ICD-10-CM

## 2020-02-06 DIAGNOSIS — O98513 Other viral diseases complicating pregnancy, third trimester: Secondary | ICD-10-CM

## 2020-02-06 MED ORDER — OXYCODONE HCL 5 MG PO TABS
5.0000 mg | ORAL_TABLET | ORAL | Status: DC | PRN
  Administered 2020-02-06 (×2): 5 mg via ORAL
  Filled 2020-02-06 (×2): qty 1

## 2020-02-06 NOTE — Progress Notes (Signed)
FACULTY PRACTICE ANTEPARTUM PROGRESS NOTE  Sherry Leach is a 25 y.o. G2P1001 at [redacted]w[redacted]d who is admitted for pyelonephritis with incidental covid-19 diagnosis.  Estimated Date of Delivery: 04/23/20 Fetal presentation is unsure.  Length of Stay:  1 Days. Admitted 02/05/2020  Subjective: Pt seen this morning.  She states she feels slightly better.  Noted occasional chills.  Primary complaints are flank pain pain and lower abdominal pain.  She does note her flank pain is worse on the left but is slightly improved from previous. Patient reports normal fetal movement.  She denies uterine contractions, denies bleeding and leaking of fluid per vagina.  Vitals:  Blood pressure (!) 116/57, pulse (!) 104, temperature 98.1 F (36.7 C), temperature source Oral, resp. rate 18, height 5' 8.5" (1.74 m), weight 91.5 kg, last menstrual period 06/17/2019, SpO2 99 %. Physical Examination: CONSTITUTIONAL: Well-developed, well-nourished female in no acute distress.  HENT:  Normocephalic, atraumatic, External right and left ear normal. Oropharynx is clear and moist EYES: Conjunctivae and EOM are normal.   NECK: Normal range of motion, supple, no masses. SKIN: Skin is warm and dry. No rash noted. Not diaphoretic. No erythema. No pallor. NEUROLGIC: Alert and oriented to person, place, and time. Normal reflexes, muscle tone coordination. No cranial nerve deficit noted. PSYCHIATRIC: Normal mood and affect. Normal behavior. Normal judgment and thought content. CARDIOVASCULAR: Normal heart rate noted, regular rhythm RESPIRATORY: Effort and breath sounds normal, no problems with respiration noted MUSCULOSKELETAL: Normal range of motion. No edema and no tenderness.  Positive left CVA tenderness noted ABDOMEN: Soft, nontender, nondistended, gravid. CERVIX: deferred  Fetal monitoring: FHR: 145 bpm, Variability: moderate, Accelerations: Present, Decelerations: Absent  (previous evening) Uterine activity: irritability    Results for orders placed or performed during the hospital encounter of 02/05/20 (from the past 48 hour(s))  Resp Panel by RT-PCR (Flu A&B, Covid) Nasopharyngeal Swab     Status: Abnormal   Collection Time: 02/05/20  2:35 PM   Specimen: Nasopharyngeal Swab; Nasopharyngeal(NP) swabs in vial transport medium  Result Value Ref Range   SARS Coronavirus 2 by RT PCR POSITIVE (A) NEGATIVE    Comment: (NOTE) SARS-CoV-2 target nucleic acids are DETECTED.  The SARS-CoV-2 RNA is generally detectable in upper respiratory specimens during the acute phase of infection. Positive results are indicative of the presence of the identified virus, but do not rule out bacterial infection or co-infection with other pathogens not detected by the test. Clinical correlation with patient history and other diagnostic information is necessary to determine patient infection status. The expected result is Negative.  Fact Sheet for Patients: BloggerCourse.com  Fact Sheet for Healthcare Providers: SeriousBroker.it  This test is not yet approved or cleared by the [place] FDA and  has been authorized for detection and/or diagnosis of SARS-CoV-2 by FDA under an Emergency Use Authorization (EUA).  This EUA will remain in effect (meaning this test can be used) for the duration of  the COVID-19 declaration under Section 564(b)(1) of the A ct, 21 U.S.C. section 360bbb-3(b)(1), unless the authorization is terminated or revoked sooner.  CORRECTED ON 01/05 AT 1546: PREVIOUSLY REPORTED AS POSITIVE RESULT CALLED TO, READ BACK BY AND VERIFIED WITH: Doreatha Massed RN 15:40 02/04/21 ([email or web address])    Influenza A by PCR NEGATIVE NEGATIVE   Influenza B by PCR NEGATIVE NEGATIVE    Comment: (NOTE) The Xpert Xpress SARS-CoV-2/FLU/RSV plus assay is intended as an aid in the diagnosis of influenza from Nasopharyngeal swab specimens and should not be used as a sole  basis for treatment.  Nasal washings and aspirates are unacceptable for Xpert Xpress SARS-CoV-2/FLU/RSV testing.  Fact Sheet for Patients: EntrepreneurPulse.com.au  Fact Sheet for Healthcare Providers: IncredibleEmployment.be  This test is not yet approved or cleared by the [place] FDA and has been authorized for detection and/or diagnosis of SARS-CoV-2 by FDA under an Emergency Use Authorization (EUA). This EUA will remain in effect (meaning this test can be used) for the duration of the COVID-19 declaration under Section 564(b)(1) of the Act, 21 U.S.C. section 360bbb-3(b)(1), unless the authorization is terminated or revoked.  Performed at [facility], [place] [address] [place], [place] [address] CORRECTED ON 01/05 AT 1546: PREVIOUSLY REPORTED AS  NEGATIVE   Urinalysis, Routine w reflex microscopic Urine, Clean Catch     Status: Abnormal   Collection Time: 02/05/20  3:04 PM  Result Value Ref Range   Color, Urine YELLOW YELLOW   APPearance HAZY (A) CLEAR   Specific Gravity, Urine 1.013 1.005 - 1.030   pH 6.0 5.0 - 8.0   Glucose, UA NEGATIVE NEGATIVE mg/dL   Hgb urine dipstick NEGATIVE NEGATIVE   Bilirubin Urine NEGATIVE NEGATIVE   Ketones, ur NEGATIVE NEGATIVE mg/dL   Protein, ur NEGATIVE NEGATIVE mg/dL   Nitrite NEGATIVE NEGATIVE   Leukocytes,Ua MODERATE (A) NEGATIVE   RBC / HPF 0-5 0 - 5 RBC/hpf   WBC, UA 0-5 0 - 5 WBC/hpf   Bacteria, UA FEW (A) NONE SEEN   Squamous Epithelial / LPF 0-5 0 - 5   Mucus PRESENT     Comment: Performed at [facility], [address], [place], [place], [place] [address]  CBC with Differential/Platelet     Status: Abnormal   Collection Time: 02/05/20  3:52 PM  Result Value Ref Range   WBC 8.1 4.0 - 10.5 K/uL   RBC 3.39 (L) 3.87 - 5.11 MIL/uL   Hemoglobin 10.1 (L) 12.0 - 15.0 g/dL   HCT 29.8 (L) 36.0 - 46.0 %   MCV 87.9 80.0 - 100.0 fL   MCH 29.8 26.0 - 34.0 pg   MCHC 33.9 30.0 - 36.0 g/dL   RDW 12.3 11.5 - 15.5 %    Platelets 113 (L) 150 - 400 K/uL    Comment: REPEATED TO VERIFY PLATELET COUNT CONFIRMED BY SMEAR Immature Platelet Fraction may be clinically indicated, consider ordering this additional test [id number]    nRBC 0.0 0.0 - 0.2 %   Neutrophils Relative % 83 %   Neutro Abs 6.7 1.7 - 7.7 K/uL   Lymphocytes Relative 7 %   Lymphs Abs 0.6 (L) 0.7 - 4.0 K/uL   Monocytes Relative 8 %   Monocytes Absolute 0.7 0.1 - 1.0 K/uL   Eosinophils Relative 1 %   Eosinophils Absolute 0.1 0.0 - 0.5 K/uL   Basophils Relative 0 %   Basophils Absolute 0.0 0.0 - 0.1 K/uL   Immature Granulocytes 1 %   Abs Immature Granulocytes 0.07 0.00 - 0.07 K/uL    Comment: Performed at [facility], [place] [address], [place], [place] [address]  Comprehensive metabolic panel     Status: Abnormal   Collection Time: 02/05/20  3:52 PM  Result Value Ref Range   Sodium 133 (L) 135 - 145 mmol/L   Potassium 3.6 3.5 - 5.1 mmol/L   Chloride 104 98 - 111 mmol/L   CO2 20 (L) 22 - 32 mmol/L   Glucose, Bld 84 70 - 99 mg/dL    Comment: Glucose reference range applies only  to samples taken after fasting for at least 8 hours.   BUN 5 (L) 6 - 20 mg/dL   Creatinine, Ser 6.62 0.44 - 1.00 mg/dL   Calcium 8.7 (L) 8.9 - 10.3 mg/dL   Total Protein 6.5 6.5 - 8.1 g/dL   Albumin 3.0 (L) 3.5 - 5.0 g/dL   AST 28 15 - 41 U/L   ALT 23 0 - 44 U/L   Alkaline Phosphatase 65 38 - 126 U/L   Total Bilirubin 0.7 0.3 - 1.2 mg/dL   GFR, Estimated >94 >76 mL/min    Comment: (NOTE) Calculated using the CKD-EPI Creatinine Equation (2021)    Anion gap 9 5 - 15    Comment: Performed at [facility], [address], [place], [place] [address]  C-reactive protein     Status: None   Collection Time: 02/05/20  3:52 PM  Result Value Ref Range   CRP 0.7 <1.0 mg/dL    Comment: Performed at [facility], [address], [place], [place] [address]  D-dimer, quantitative (not at [facility])     Status: Abnormal   Collection Time: 02/05/20  3:52 PM   Result Value Ref Range   D-Dimer, Quant 0.79 (H) 0.00 - 0.50 ug/mL-FEU    Comment: (NOTE) At the manufacturer cut-off value of 0.5 g/mL FEU, this assay has a negative predictive value of 95-100%.This assay is intended for use in conjunction with a clinical pretest probability (PTP) assessment model to exclude pulmonary embolism (PE) and deep venous thrombosis (DVT) in outpatients suspected of PE or DVT. Results should be correlated with clinical presentation. Performed at [facility], [address] [place], [place] [address]   Type and screen [facility]     Status: None   Collection Time: 02/05/20  4:46 PM  Result Value Ref Range   ABO/RH(D) O NEG    Antibody Screen NEG    Sample Expiration      02/08/2020,2359 Performed at [facility], [address] [place], [place] [address]     I have reviewed the patient's current medications.  ASSESSMENT: Active Problems:   Pyelonephritis affecting pregnancy in third trimester   PLAN: Continue IV antibiotics Awaiting urine culture Pain control as needed Daily NST   Continue routine antenatal care.   Mariel Aloe, MD [facility] Faculty Attending, Center for [facility] 02/06/2020 9:42 AM

## 2020-02-07 ENCOUNTER — Other Ambulatory Visit (HOSPITAL_COMMUNITY): Payer: Self-pay | Admitting: Obstetrics and Gynecology

## 2020-02-07 DIAGNOSIS — Z3A29 29 weeks gestation of pregnancy: Secondary | ICD-10-CM | POA: Diagnosis not present

## 2020-02-07 DIAGNOSIS — O98513 Other viral diseases complicating pregnancy, third trimester: Secondary | ICD-10-CM | POA: Diagnosis not present

## 2020-02-07 DIAGNOSIS — U071 COVID-19: Secondary | ICD-10-CM | POA: Diagnosis not present

## 2020-02-07 DIAGNOSIS — O2303 Infections of kidney in pregnancy, third trimester: Secondary | ICD-10-CM | POA: Diagnosis not present

## 2020-02-07 DIAGNOSIS — N12 Tubulo-interstitial nephritis, not specified as acute or chronic: Secondary | ICD-10-CM | POA: Diagnosis not present

## 2020-02-07 LAB — URINE CULTURE
Culture: <10000 — AB
Special Requests: NORMAL

## 2020-02-07 MED ORDER — OXYCODONE HCL 5 MG PO TABS: 5.0000 mg | ORAL_TABLET | ORAL | 0 refills | Status: DC | PRN

## 2020-02-07 MED ORDER — SULFAMETHOXAZOLE-TRIMETHOPRIM 800-160 MG PO TABS: 1.0000 | ORAL_TABLET | Freq: Two times a day (BID) | ORAL | 0 refills | Status: DC

## 2020-02-07 MED ORDER — CEFDINIR 300 MG PO CAPS: 300.0000 mg | ORAL_CAPSULE | Freq: Two times a day (BID) | ORAL | 0 refills | Status: DC

## 2020-02-07 MED ORDER — ACETAMINOPHEN 500 MG PO TABS: 1000.0000 mg | ORAL_TABLET | Freq: Four times a day (QID) | ORAL | Status: DC | PRN

## 2020-02-07 MED ORDER — CYCLOBENZAPRINE HCL 10 MG PO TABS: 10.0000 mg | ORAL_TABLET | Freq: Three times a day (TID) | ORAL | 0 refills | Status: DC | PRN

## 2020-02-07 MED FILL — CYCLOBENZAPRINE 10 MG TAB: 10 | 10 days supply | Qty: 30 | Fill #0

## 2020-02-07 MED FILL — CEFDINIR 300 MG CAPSULE: 300 | 12 days supply | Qty: 24 | Fill #0

## 2020-02-07 MED FILL — oxyCODONE HCL 5 MG TABS: 5 | 2 days supply | Qty: 12 | Fill #0

## 2020-02-07 NOTE — Discharge Summary (Addendum)
Antenatal Physician Discharge Summary  Patient ID: Sherry Leach MRN: [id number] DOB/AGE: [birth year] 24 y.o.  Admit date: 02/05/2020 Discharge date: 02/07/2020  Admission Diagnoses: pyelonephritis in third trimester, covid -19 infection  Discharge Diagnoses: pyelonephritis in third trimester, covid -19 infection  Prenatal Procedures: IV antibiotics  Consults:n/a Hospital Course:  Sherry Leach is a 25 y.o. G2P1001 with IUP at [redacted]w[redacted]d admitted for pyelonephritis.  She was admitted with report of fever to 101.2 at home.   She experienced chills, nausea and back pain.Clinically, pt was found to have kidney infection, and during her evaluation she was found to be covid 19 positive.  Pt denied any significant respiratory symptoms. Pt was admitted for IV rocephin.  Her symptoms improved and she remained afebrile during her stay.  Back pain improved from 8/10 pain to 4/10 pain on 02/07/20.  Pt desired to go home. She was deemed stable for discharge to home with outpatient follow up.  Discharge Exam: Temp:  [97.9 F (36.6 C)-98.4 F (36.9 C)] 98.3 F (36.8 C) (01/07 0802) Pulse Rate:  [84-99] 84 (01/07 0802) Resp:  [17-20] 20 (01/07 0802) BP: (107-125)/(54-72) 120/72 (01/07 0802) SpO2:  [97 %-99 %] 98 % (01/07 0802) Physical Examination: CONSTITUTIONAL: Well-developed, well-nourished female in no acute distress.  HENT:  Normocephalic, atraumatic, External right and left ear normal. Oropharynx is clear and moist EYES: Conjunctivae and EOM are normal.  NECK: Normal range of motion, supple, no masses SKIN: Skin is warm and dry. No rash noted. Not diaphoretic. No erythema. No pallor. NEUROLGIC: Alert and oriented to person, place, and time. Normal reflexes, muscle tone coordination. No cranial nerve deficit noted. PSYCHIATRIC: Normal mood and affect. Normal behavior. Normal judgment and thought content. CARDIOVASCULAR: Normal heart rate noted, regular rhythm RESPIRATORY: Effort and breath sounds normal,  no problems with respiration noted MUSCULOSKELETAL: Normal range of motion. No edema and no tenderness. 2+ distal pulses. Left CVA tenderness minimal ABDOMEN: Soft, nontender, nondistended, gravid. CERVIX:  deferred NST: baseline 150s, moderate variability, accels noted, no decels, toco: none, category 1 strip for gestational age Significant Diagnostic Studies:  Results for orders placed or performed during the hospital encounter of 02/05/20 (from the past 168 hour(s))  Resp Panel by RT-PCR (Flu A&B, Covid) Nasopharyngeal Swab   Collection Time: 02/05/20  2:35 PM   Specimen: Nasopharyngeal Swab; Nasopharyngeal(NP) swabs in vial transport medium  Result Value Ref Range   SARS Coronavirus 2 by RT PCR POSITIVE (A) NEGATIVE   Influenza A by PCR NEGATIVE NEGATIVE   Influenza B by PCR NEGATIVE NEGATIVE  Urine Culture   Collection Time: 02/05/20  3:04 PM   Specimen: Urine, Clean Catch  Result Value Ref Range   Specimen Description URINE, CLEAN CATCH    Special Requests Normal    Culture (A)     <10,000 COLONIES/mL INSIGNIFICANT GROWTH Performed at [facility], [address], [place], [place] [address]    Report Status 02/07/2020 FINAL   Urinalysis, Routine w reflex microscopic Urine, Clean Catch   Collection Time: 02/05/20  3:04 PM  Result Value Ref Range   Color, Urine YELLOW YELLOW   APPearance HAZY (A) CLEAR   Specific Gravity, Urine 1.013 1.005 - 1.030   pH 6.0 5.0 - 8.0   Glucose, UA NEGATIVE NEGATIVE mg/dL   Hgb urine dipstick NEGATIVE NEGATIVE   Bilirubin Urine NEGATIVE NEGATIVE   Ketones, ur NEGATIVE NEGATIVE mg/dL   Protein, ur NEGATIVE NEGATIVE mg/dL   Nitrite NEGATIVE NEGATIVE   Leukocytes,Ua MODERATE (A)  NEGATIVE   RBC / HPF 0-5 0 - 5 RBC/hpf   WBC, UA 0-5 0 - 5 WBC/hpf   Bacteria, UA FEW (A) NONE SEEN   Squamous Epithelial / LPF 0-5 0 - 5   Mucus PRESENT   CBC with Differential/Platelet   Collection Time: 02/05/20  3:52 PM  Result Value Ref Range   WBC 8.1  4.0 - 10.5 K/uL   RBC 3.39 (L) 3.87 - 5.11 MIL/uL   Hemoglobin 10.1 (L) 12.0 - 15.0 g/dL   HCT 29.8 (L) 36.0 - 46.0 %   MCV 87.9 80.0 - 100.0 fL   MCH 29.8 26.0 - 34.0 pg   MCHC 33.9 30.0 - 36.0 g/dL   RDW 12.3 11.5 - 15.5 %   Platelets 113 (L) 150 - 400 K/uL   nRBC 0.0 0.0 - 0.2 %   Neutrophils Relative % 83 %   Neutro Abs 6.7 1.7 - 7.7 K/uL   Lymphocytes Relative 7 %   Lymphs Abs 0.6 (L) 0.7 - 4.0 K/uL   Monocytes Relative 8 %   Monocytes Absolute 0.7 0.1 - 1.0 K/uL   Eosinophils Relative 1 %   Eosinophils Absolute 0.1 0.0 - 0.5 K/uL   Basophils Relative 0 %   Basophils Absolute 0.0 0.0 - 0.1 K/uL   Immature Granulocytes 1 %   Abs Immature Granulocytes 0.07 0.00 - 0.07 K/uL  Comprehensive metabolic panel   Collection Time: 02/05/20  3:52 PM  Result Value Ref Range   Sodium 133 (L) 135 - 145 mmol/L   Potassium 3.6 3.5 - 5.1 mmol/L   Chloride 104 98 - 111 mmol/L   CO2 20 (L) 22 - 32 mmol/L   Glucose, Bld 84 70 - 99 mg/dL   BUN 5 (L) 6 - 20 mg/dL   Creatinine, Ser 0.47 0.44 - 1.00 mg/dL   Calcium 8.7 (L) 8.9 - 10.3 mg/dL   Total Protein 6.5 6.5 - 8.1 g/dL   Albumin 3.0 (L) 3.5 - 5.0 g/dL   AST 28 15 - 41 U/L   ALT 23 0 - 44 U/L   Alkaline Phosphatase 65 38 - 126 U/L   Total Bilirubin 0.7 0.3 - 1.2 mg/dL   GFR, Estimated >60 >60 mL/min   Anion gap 9 5 - 15  C-reactive protein   Collection Time: 02/05/20  3:52 PM  Result Value Ref Range   CRP 0.7 <1.0 mg/dL  D-dimer, quantitative (not at [facility])   Collection Time: 02/05/20  3:52 PM  Result Value Ref Range   D-Dimer, Quant 0.79 (H) 0.00 - 0.50 ug/mL-FEU  Type and screen [place]   Collection Time: 02/05/20  4:46 PM  Result Value Ref Range   ABO/RH(D) O NEG    Antibody Screen NEG    Sample Expiration      02/08/2020,2359 Performed at [facility], [address] [place], [place] [address]    DG CHEST PORT 1 VIEW  Result Date: 02/05/2020 CLINICAL DATA:  Back pain. EXAM: PORTABLE CHEST 1  VIEW COMPARISON:  May 31, 2019. FINDINGS: The heart size and mediastinal contours are within normal limits. Both lungs are clear. No pneumothorax or pleural effusion is noted. The visualized skeletal structures are unremarkable. IMPRESSION: No active disease. Electronically Signed   By: Marijo Conception M.D.   On: 02/05/2020 16:22    Future Appointments  Date Time Provider [place]  02/13/2020  8:45 AM [facility] [place] None  02/13/2020  9:30 AM Jodi Mourning,  Bing Neighbors, MD [facility] None    Discharge Condition: Stable  Discharge disposition: 01-Home or Self Care       Discharge Instructions    Discharge activity:   Complete by: As directed    Quarantine x 8 days   Discharge activity:  No Restrictions   Complete by: As directed    Discharge diet:  No restrictions   Complete by: As directed    Fetal Kick Count:  Lie on our left side for one hour after a meal, and count the number of times your baby kicks.  If it is less than 5 times, get up, move around and drink some juice.  Repeat the test 30 minutes later.  If it is still less than 5 kicks in an hour, notify your doctor.   Complete by: As directed    No sexual activity restrictions   Complete by: As directed    Notify physician for a general feeling that "something is not right"   Complete by: As directed    Notify physician for increase or change in vaginal discharge   Complete by: As directed    Notify physician for intestinal cramps, with or without diarrhea, sometimes described as "gas pain"   Complete by: As directed    Notify physician for leaking of fluid   Complete by: As directed    Notify physician for low, dull backache, unrelieved by heat or Tylenol   Complete by: As directed    Notify physician for menstrual like cramps   Complete by: As directed    Notify physician for pelvic pressure   Complete by: As directed    Notify physician for uterine contractions.  These may be painless and feel like the uterus is  tightening or the baby is  "balling up"   Complete by: As directed    Notify physician for vaginal bleeding   Complete by: As directed    PRETERM LABOR:  Includes any of the follwing symptoms that occur between 20 - [redacted] weeks gestation.  If these symptoms are not stopped, preterm labor can result in preterm delivery, placing your baby at risk   Complete by: As directed      Allergies as of 02/07/2020      Reactions   Amoxicillin Rash   Has patient had a PCN reaction causing immediate rash, facial/tongue/throat swelling, SOB or lightheadedness with hypotension: No Has patient had a PCN reaction causing severe rash involving mucus membranes or skin necrosis: No Has patient had a PCN reaction that required hospitalization: No Has patient had a PCN reaction occurring within the last 10 years: Yes If all of the above answers are "NO", then may proceed with Cephalosporin use.   Penicillins Rash   Has patient had a PCN reaction causing immediate rash, facial/tongue/throat swelling, SOB or lightheadedness with hypotension: No Has patient had a PCN reaction causing severe rash involving mucus membranes or skin necrosis: No Has patient had a PCN reaction that required hospitalization: No Has patient had a PCN reaction occurring within the last 10 years: Yes If all of the above answers are "NO", then may proceed with Cephalosporin use.      Medication List    STOP taking these medications   cefadroxil 500 MG capsule Commonly known as: DURICEF   Comfort Fit Maternity Supp Sm Misc     TAKE these medications   cyclobenzaprine 10 MG tablet Commonly known as: FLEXERIL Take 1 tablet (10 mg total) by mouth 3 (three) times  daily as needed for up to 10 days for muscle spasms.   oxyCODONE 5 MG immediate release tablet Commonly known as: Oxy IR/ROXICODONE Take 1 tablet (5 mg total) by mouth every 4 (four) hours as needed for up to 12 doses for moderate pain.   Prenatal Gummies/DHA & FA 0.4-32.5 MG  Chew Chew by mouth.   sulfamethoxazole-trimethoprim 800-160 MG tablet Commonly known as: BACTRIM DS Take 1 tablet by mouth 2 (two) times daily for 14 days.       Follow-up Information    [facility]. Schedule an appointment as soon as possible for a visit in 1 week(s).   Specialty: Obstetrics and Gynecology Why: virtual visit for hospital follow up for peylonephritis Contact information: [address], [address] [place] [address] [phone]             Pt will be discharged after her morning NST Total discharge time: 15 minutes   Signed: Warden Fillers M.D. 02/07/2020, 9:40 AM

## 2020-02-07 NOTE — Discharge Instructions (Signed)
Pyelonephritis During Pregnancy ° °What are the causes? °This condition is caused by a bacterial infection in the lower urinary tract that spreads to the kidney. °What increases the risk? °You are more likely to develop this condition if: °· You have diabetes. °· You have a history of frequent urinary tract infections. °What are the signs or symptoms? °Symptoms of this condition may begin with symptoms of a lower urinary tract infection. These may include: °· A frequent urge to pass urine. °· Burning pain when passing urine. °· Pain and pressure in your lower abdomen. °· Blood in your urine. °· Cloudy or smelly urine. °As the infection spreads to your kidney, you may have these symptoms: °· Fever. °· Chills. °· Pain and tenderness in your upper abdomen or in your back and sides (flank pain). Flank pain often affects one side of the body, usually the right side. °· Nausea, vomiting, or loss of appetite. °How is this diagnosed? °This condition may be diagnosed based on: °· Your symptoms and medical history. °· A physical exam. °· Tests to confirm the diagnosis. These may include: °? Blood tests to check kidney function and look for signs of infection. °? Urine tests to check for signs of infection, including bacteria, white or red blood cells, and protein. °? Tests to grow and identify the type of bacteria that is causing the infection (urine culture). °? Imaging studies of your kidneys to learn more about your condition. °How is this treated? °This condition is treated in the hospital with antibiotics that are given into one of your veins through an IV. Your health care provider: °· Will start you on an antibiotic that is effective against common urinary tract infections. °· May switch to another antibiotic if the results of your urine culture show that your infection is caused by different bacteria. °· Will be careful to choose antibiotics that are the safest during pregnancy. °Other treatments may include: °· IV  fluids if you are nauseous and not able to drink fluids. °· Pain and fever medicines. °· Medicines for nausea and vomiting. °You will be able to go home when your infection is under control. To prevent another infection, you may need to continue taking antibiotics by mouth until your baby is born. °Follow these instructions at home: °Medicines ° °· Take over-the-counter and prescription medicines only as told by your health care provider. °· Take your antibiotic medicine as told by your health care provider. Do not stop taking the antibiotic even if you start to feel better. °· Continue to take your prenatal vitamins. °Lifestyle ° °· Follow instructions from your health care provider about eating or drinking restrictions. You may need to avoid: °? Foods and drinks with added sugar. °? Caffeine and fruit juice. °· Drink enough fluid to keep your urine pale yellow. °· Go to the bathroom frequently. Do not hold your urine. Try to empty your bladder completely. °· Change your underwear every day. Wear all-cotton underwear. Do not wear tight underwear or pants. °General instructions ° °· Take these steps to lower the risk of bacteria getting into your urinary tract: °? Use liquid soap instead of bar soap when showering or bathing. Bacteria can grow on bar soap. °? When you wash yourself, clean the urethra opening first. Use a washcloth to clean the area between your vagina and anus. Pat the area dry with a clean towel. °? Wash your hands before and after you go to the bathroom. °? Wipe yourself from front to back   after going to the bathroom. °? Do not use douches, perfumed soap, creams, or powders. °? Do not soak in a bath for more than 30 minutes. °· Return to your normal activities as told by your health care provider. Ask your health care provider what activities are safe for you. °· Keep all follow-up visits as told by your health care provider. This is important. °Contact a health care provider if: °· You have  chills or a fever. °· You have any symptoms of infection that do not get better at home. °· Symptoms of infection come back. °· You have a reaction or side effects from your antibiotic. °Get help right away if: °· You start having contractions. °Summary °· Pyelonephritis is an infection of the kidney or kidneys. °· This condition results when a bacterial infection in the lower urinary tract spreads to the kidney. °· Lower urinary tract infections are common during pregnancy. °· Pyelonephritis causes chills, a fever, flank pain, and nausea. °· Pyelonephritis is a serious infection that is usually treated in the hospital with IV antibiotics. °This information is not intended to replace advice given to you by your health care provider. Make sure you discuss any questions you have with your health care provider. °Document Revised: 05/11/2018 Document Reviewed: 04/20/2017 °Elsevier Patient Education © 2020 [employer] Inc[employer] ° °

## 2020-02-07 NOTE — Progress Notes (Incomplete)
Discharge instructions given: preterm labor, VB, ROM return to MAU to be evaluated. Fetal kick counts reviewed with patient and patient verbalizes understanding. Medications from [facility] pharmacy given to patient and patient instructed that she will only be taking ONE antibiotic. Patient also instructed take her IS home and use hourly while awake, notify physician if fever over 100.4. Pt also instructed to about if SOB occurs to call OB or 911. Patient verbalizes understanding.

## 2020-02-13 ENCOUNTER — Encounter: Payer: Medicaid Other | Admitting: Obstetrics

## 2020-02-13 ENCOUNTER — Other Ambulatory Visit: Payer: Medicaid Other

## 2020-02-19 ENCOUNTER — Encounter: Payer: Self-pay | Admitting: Obstetrics

## 2020-02-19 ENCOUNTER — Other Ambulatory Visit: Payer: Self-pay

## 2020-02-19 ENCOUNTER — Other Ambulatory Visit: Payer: Medicaid Other

## 2020-02-19 ENCOUNTER — Ambulatory Visit (INDEPENDENT_AMBULATORY_CARE_PROVIDER_SITE_OTHER): Payer: Medicaid Other | Admitting: Obstetrics

## 2020-02-19 VITALS — BP 103/65 | HR 98 | Wt 203.0 lb

## 2020-02-19 DIAGNOSIS — Z87440 Personal history of urinary (tract) infections: Secondary | ICD-10-CM | POA: Diagnosis not present

## 2020-02-19 DIAGNOSIS — Z3480 Encounter for supervision of other normal pregnancy, unspecified trimester: Secondary | ICD-10-CM | POA: Diagnosis not present

## 2020-02-19 DIAGNOSIS — Z8759 Personal history of other complications of pregnancy, childbirth and the puerperium: Secondary | ICD-10-CM | POA: Diagnosis not present

## 2020-02-19 DIAGNOSIS — O99119 Other diseases of the blood and blood-forming organs and certain disorders involving the immune mechanism complicating pregnancy, unspecified trimester: Secondary | ICD-10-CM | POA: Diagnosis not present

## 2020-02-19 DIAGNOSIS — O26849 Uterine size-date discrepancy, unspecified trimester: Secondary | ICD-10-CM

## 2020-02-19 DIAGNOSIS — D696 Thrombocytopenia, unspecified: Secondary | ICD-10-CM

## 2020-02-19 MED ORDER — CEFADROXIL 500 MG PO CAPS: 500.0000 mg | ORAL_CAPSULE | Freq: Every day | ORAL | 1 refills | Status: DC

## 2020-02-19 NOTE — Progress Notes (Signed)
ROB   2hr GTT today. T-Dap Declined   Pt notes falling yesterday on ice.

## 2020-02-19 NOTE — Progress Notes (Signed)
Subjective:  Sherry Leach is a 25 y.o. G2P1001 at [redacted]w[redacted]d being seen today for ongoing prenatal care.  She is currently monitored for the following issues for this high-risk pregnancy and has Chlamydia trachomatis infection in pregnancy in second trimester; Supervision of other normal pregnancy, antepartum; GBS (group b Streptococcus) UTI complicating pregnancy, second trimester; and Pyelonephritis affecting pregnancy in third trimester on their problem list.  Patient reports backache.  Contractions: Irritability. Vag. Bleeding: None.  Movement: Present. Denies leaking of fluid.   The following portions of the patient's history were reviewed and updated as appropriate: allergies, current medications, past family history, past medical history, past social history, past surgical history and problem list. Problem list updated.  Objective:   Vitals:   02/19/20 0852  BP: 103/65  Pulse: 98  Weight: 203 lb (92.1 kg)    Fetal Status:     Movement: Present     General:  Alert, oriented and cooperative. Patient is in no acute distress.  Skin: Skin is warm and dry. No rash noted.   Cardiovascular: Normal heart rate noted  Respiratory: Normal respiratory effort, no problems with respiration noted  Abdomen: Soft, gravid, fundal height = 34 cm.  Pain/Pressure: Present     Pelvic:  Cervical exam deferred        Extremities: Normal range of motion.  Edema: Trace  Mental Status: Normal mood and affect. Normal behavior. Normal judgment and thought content.   Urinalysis:      Assessment and Plan:  Pregnancy: G2P1001 at [redacted]w[redacted]d  1. Supervision of other normal pregnancy, antepartum Rx: - Glucose Tolerance, 2 Hours w/1 Hour - CBC - RPR - HIV Antibody (routine testing w rflx)  2. Significant discrepancy between uterine size and clinical dates, antepartum Rx: - [place] MFM OB FOLLOW UP; Future  3. H/O pyelonephritis during pregnancy Rx: - cefadroxil (DURICEF) 500 MG capsule; Take 1 capsule (500 mg total)  by mouth daily. For suppression until after delivery.  Dispense: 30 capsule; Refill: 1 - Comprehensive metabolic panel  4. Thrombocytopenia affecting pregnancy, antepartum (HCC) Rx: - CBC with Differential/Platelet   Preterm labor symptoms and general obstetric precautions including but not limited to vaginal bleeding, contractions, leaking of fluid and fetal movement were reviewed in detail with the patient. Please refer to After Visit Summary for other counseling recommendations.   Return in about 2 weeks (around 03/04/2020) for [facility].   Brock Bad, MD  02/19/20

## 2020-02-20 ENCOUNTER — Other Ambulatory Visit: Payer: Self-pay | Admitting: Obstetrics

## 2020-02-20 DIAGNOSIS — O99019 Anemia complicating pregnancy, unspecified trimester: Secondary | ICD-10-CM

## 2020-02-20 LAB — COMPREHENSIVE METABOLIC PANEL
ALT: 16 IU/L (ref 0–32)
AST: 11 IU/L (ref 0–40)
Albumin/Globulin Ratio: 1.4 (ref 1.2–2.2)
Albumin: 3.7 g/dL — ABNORMAL LOW (ref 3.9–5.0)
Alkaline Phosphatase: 82 IU/L (ref 44–121)
BUN/Creatinine Ratio: 10 (ref 9–23)
BUN: 6 mg/dL (ref 6–20)
Bilirubin Total: 0.3 mg/dL (ref 0.0–1.2)
CO2: 17 mmol/L — ABNORMAL LOW (ref 20–29)
Calcium: 9 mg/dL (ref 8.7–10.2)
Chloride: 104 mmol/L (ref 96–106)
Creatinine, Ser: 0.58 mg/dL (ref 0.57–1.00)
GFR calc Af Amer: 150 mL/min/{1.73_m2} (ref >59)
GFR calc non Af Amer: 130 mL/min/{1.73_m2} (ref >59)
Globulin, Total: 2.7 g/dL (ref 1.5–4.5)
Glucose: 86 mg/dL (ref 65–99)
Potassium: 3.9 mmol/L (ref 3.5–5.2)
Sodium: 134 mmol/L (ref 134–144)
Total Protein: 6.4 g/dL (ref 6.0–8.5)

## 2020-02-20 LAB — HIV ANTIBODY (ROUTINE TESTING W REFLEX): HIV Screen 4th Generation wRfx: NONREACTIVE

## 2020-02-20 LAB — GLUCOSE TOLERANCE, 2 HOURS W/ 1HR
Glucose, 1 hour: 170 mg/dL (ref 65–179)
Glucose, 2 hour: 107 mg/dL (ref 65–152)
Glucose, Fasting: 88 mg/dL (ref 65–91)

## 2020-02-20 LAB — CBC WITH DIFFERENTIAL/PLATELET
Basophils Absolute: 0 10*3/uL (ref 0.0–0.2)
Basos: 0 %
EOS (ABSOLUTE): 0.2 10*3/uL (ref 0.0–0.4)
Eos: 2 %
Hematocrit: 31.9 % — ABNORMAL LOW (ref 34.0–46.6)
Hemoglobin: 10.5 g/dL — ABNORMAL LOW (ref 11.1–15.9)
Immature Grans (Abs): 0.1 10*3/uL (ref 0.0–0.1)
Immature Granulocytes: 1 %
Lymphocytes Absolute: 1.9 10*3/uL (ref 0.7–3.1)
Lymphs: 19 %
MCH: 28.8 pg (ref 26.6–33.0)
MCHC: 32.9 g/dL (ref 31.5–35.7)
MCV: 87 fL (ref 79–97)
Monocytes Absolute: 0.7 10*3/uL (ref 0.1–0.9)
Monocytes: 7 %
Neutrophils Absolute: 7.2 10*3/uL — ABNORMAL HIGH (ref 1.4–7.0)
Neutrophils: 71 %
Platelets: 161 10*3/uL (ref 150–450)
RBC: 3.65 x10E6/uL — ABNORMAL LOW (ref 3.77–5.28)
RDW: 12.2 % (ref 11.7–15.4)
WBC: 10.2 10*3/uL (ref 3.4–10.8)

## 2020-02-20 LAB — RPR: RPR Ser Ql: NONREACTIVE

## 2020-02-20 MED ORDER — FERROUS SULFATE 325 (65 FE) MG PO TABS: 325.0000 mg | ORAL_TABLET | ORAL | 5 refills | Status: DC

## 2020-03-04 ENCOUNTER — Ambulatory Visit (INDEPENDENT_AMBULATORY_CARE_PROVIDER_SITE_OTHER): Payer: Medicaid Other | Admitting: Obstetrics & Gynecology

## 2020-03-04 ENCOUNTER — Other Ambulatory Visit: Payer: Self-pay

## 2020-03-04 VITALS — BP 128/84 | HR 104 | Wt 215.5 lb

## 2020-03-04 DIAGNOSIS — O2342 Unspecified infection of urinary tract in pregnancy, second trimester: Secondary | ICD-10-CM

## 2020-03-04 DIAGNOSIS — O163 Unspecified maternal hypertension, third trimester: Secondary | ICD-10-CM | POA: Diagnosis not present

## 2020-03-04 DIAGNOSIS — Z3480 Encounter for supervision of other normal pregnancy, unspecified trimester: Secondary | ICD-10-CM | POA: Diagnosis not present

## 2020-03-04 DIAGNOSIS — B951 Streptococcus, group B, as the cause of diseases classified elsewhere: Secondary | ICD-10-CM

## 2020-03-04 NOTE — Patient Instructions (Signed)

## 2020-03-04 NOTE — Progress Notes (Signed)
Pt reports fetal movement and reports that she has been having burning sensation and occasional pain in lower abdomen.

## 2020-03-04 NOTE — Progress Notes (Signed)
   PRENATAL VISIT NOTE  Subjective:  Sherry Leach is a 25 y.o. G2P1001 at [redacted]w[redacted]d being seen today for ongoing prenatal care.  She is currently monitored for the following issues for this high-risk pregnancy and has Chlamydia trachomatis infection in pregnancy in second trimester; Supervision of other normal pregnancy, antepartum; GBS (group b Streptococcus) UTI complicating pregnancy, second trimester; and Pyelonephritis affecting pregnancy in third trimester on their problem list.  Patient reports no complaints.  Contractions: Not present. Vag. Bleeding: None.  Movement: Present. Denies leaking of fluid.   The following portions of the patient's history were reviewed and updated as appropriate: allergies, current medications, past family history, past medical history, past social history, past surgical history and problem list.   Objective:   Vitals:   03/04/20 1508 03/04/20 1513  BP: (!) 137/91 128/84  Pulse: (!) 120 (!) 104  Weight: 215 lb 8 oz (97.8 kg)     Fetal Status: Fetal Heart Rate (bpm): 158 Fundal Height: 34 cm Movement: Present     General:  Alert, oriented and cooperative. Patient is in no acute distress.  Skin: Skin is warm and dry. No rash noted.   Cardiovascular: Normal heart rate noted  Respiratory: Normal respiratory effort, no problems with respiration noted  Abdomen: Soft, gravid, appropriate for gestational age.  Pain/Pressure: Present     Pelvic: Cervical exam deferred        Extremities: Normal range of motion.  Edema: Trace  Mental Status: Normal mood and affect. Normal behavior. Normal judgment and thought content.   Assessment and Plan:  Pregnancy: G2P1001 at [redacted]w[redacted]d 1. Supervision of other normal pregnancy, antepartum Elevated BP readings X 2 - Protein / creatinine ratio, urine - Comprehensive metabolic panel - CBC  2. GBS (group b Streptococcus) UTI complicating pregnancy, second trimester   3. Hypertension affecting pregnancy in third  trimester HTN - CBC  Preterm labor symptoms and general obstetric precautions including but not limited to vaginal bleeding, contractions, leaking of fluid and fetal movement were reviewed in detail with the patient. Please refer to After Visit Summary for other counseling recommendations.   Return in about 1 week (around 03/11/2020).  Future Appointments  Date Time Provider Department Center  03/11/2020  9:30 AM [facility] NURSE [facility]  03/11/2020  9:45 AM [facility]  03/12/2020  2:30 PM Warden Fillers, MD [facility] None    Scheryl Darter, MD

## 2020-03-05 LAB — CBC
Hematocrit: 31.1 % — ABNORMAL LOW (ref 34.0–46.6)
Hemoglobin: 10.6 g/dL — ABNORMAL LOW (ref 11.1–15.9)
MCH: 28.5 pg (ref 26.6–33.0)
MCHC: 34.1 g/dL (ref 31.5–35.7)
MCV: 84 fL (ref 79–97)
Platelets: 203 10*3/uL (ref 150–450)
RBC: 3.72 x10E6/uL — ABNORMAL LOW (ref 3.77–5.28)
RDW: 12.8 % (ref 11.7–15.4)
WBC: 13.6 10*3/uL — ABNORMAL HIGH (ref 3.4–10.8)

## 2020-03-05 LAB — COMPREHENSIVE METABOLIC PANEL
ALT: 12 IU/L (ref 0–32)
AST: 17 IU/L (ref 0–40)
Albumin/Globulin Ratio: 1.3 (ref 1.2–2.2)
Albumin: 3.7 g/dL — ABNORMAL LOW (ref 3.9–5.0)
Alkaline Phosphatase: 85 IU/L (ref 44–121)
BUN/Creatinine Ratio: 7 — ABNORMAL LOW (ref 9–23)
BUN: 3 mg/dL — ABNORMAL LOW (ref 6–20)
Bilirubin Total: <0.2 mg/dL (ref 0.0–1.2)
CO2: 16 mmol/L — ABNORMAL LOW (ref 20–29)
Calcium: 9 mg/dL (ref 8.7–10.2)
Chloride: 106 mmol/L (ref 96–106)
Creatinine, Ser: 0.45 mg/dL — ABNORMAL LOW (ref 0.57–1.00)
GFR calc Af Amer: 162 mL/min/{1.73_m2} (ref >59)
GFR calc non Af Amer: 141 mL/min/{1.73_m2} (ref >59)
Globulin, Total: 2.8 g/dL (ref 1.5–4.5)
Glucose: 99 mg/dL (ref 65–99)
Potassium: 3.7 mmol/L (ref 3.5–5.2)
Sodium: 139 mmol/L (ref 134–144)
Total Protein: 6.5 g/dL (ref 6.0–8.5)

## 2020-03-05 LAB — PROTEIN / CREATININE RATIO, URINE
Creatinine, Urine: 29.8 mg/dL
Protein, Ur: 5.2 mg/dL
Protein/Creat Ratio: 174 mg/g creat (ref 0–200)

## 2020-03-11 ENCOUNTER — Ambulatory Visit: Payer: Medicaid Other | Admitting: *Deleted

## 2020-03-11 ENCOUNTER — Other Ambulatory Visit: Payer: Self-pay

## 2020-03-11 ENCOUNTER — Ambulatory Visit: Payer: Medicaid Other | Attending: Obstetrics and Gynecology

## 2020-03-11 ENCOUNTER — Encounter: Payer: Self-pay | Admitting: *Deleted

## 2020-03-11 DIAGNOSIS — O26849 Uterine size-date discrepancy, unspecified trimester: Secondary | ICD-10-CM | POA: Insufficient documentation

## 2020-03-11 DIAGNOSIS — Z3480 Encounter for supervision of other normal pregnancy, unspecified trimester: Secondary | ICD-10-CM | POA: Diagnosis present

## 2020-03-11 DIAGNOSIS — Z3A33 33 weeks gestation of pregnancy: Secondary | ICD-10-CM | POA: Diagnosis not present

## 2020-03-11 DIAGNOSIS — O26843 Uterine size-date discrepancy, third trimester: Secondary | ICD-10-CM | POA: Diagnosis not present

## 2020-03-12 ENCOUNTER — Ambulatory Visit (INDEPENDENT_AMBULATORY_CARE_PROVIDER_SITE_OTHER): Payer: Medicaid Other | Admitting: Obstetrics and Gynecology

## 2020-03-12 DIAGNOSIS — Z3480 Encounter for supervision of other normal pregnancy, unspecified trimester: Secondary | ICD-10-CM

## 2020-03-12 DIAGNOSIS — Z3A34 34 weeks gestation of pregnancy: Secondary | ICD-10-CM | POA: Insufficient documentation

## 2020-03-12 DIAGNOSIS — R8271 Bacteriuria: Secondary | ICD-10-CM

## 2020-03-12 NOTE — Progress Notes (Signed)
   PRENATAL VISIT NOTE  Subjective:  Sherry Leach is a 25 y.o. G2P1001 at [redacted]w[redacted]d being seen today for ongoing prenatal care.  She is currently monitored for the following issues for this high-risk pregnancy and has Chlamydia trachomatis infection in pregnancy in second trimester; Supervision of other normal pregnancy, antepartum; GBS (group b Streptococcus) UTI complicating pregnancy, second trimester; Pyelonephritis affecting pregnancy in third trimester; [redacted] weeks gestation of pregnancy; and GBS bacteriuria on their problem list.  Patient doing well with no acute concerns today. She reports no complaints.  Contractions: Irregular. Vag. Bleeding: None.  Movement: Present. Denies leaking of fluid, but does note increased vaginal discharge.   The following portions of the patient's history were reviewed and updated as appropriate: allergies, current medications, past family history, past medical history, past social history, past surgical history and problem list. Problem list updated.  Objective:   Vitals:   03/12/20 1444  BP: 132/83  Pulse: (!) 108  Weight: 218 lb 6.4 oz (99.1 kg)    Fetal Status: Fetal Heart Rate (bpm): 152   Movement: Present     General:  Alert, oriented and cooperative. Patient is in no acute distress.  Skin: Skin is warm and dry. No rash noted.   Cardiovascular: Normal heart rate noted  Respiratory: Normal respiratory effort, no problems with respiration noted  Abdomen: Soft, gravid, appropriate for gestational age.  Pain/Pressure: Present     Pelvic: Cervical exam deferred        Extremities: Normal range of motion.     Mental Status:  Normal mood and affect. Normal behavior. Normal judgment and thought content.   Assessment and Plan:  Pregnancy: G2P1001 at [redacted]w[redacted]d  1. Supervision of other normal pregnancy, antepartum   2. [redacted] weeks gestation of pregnancy   3. GBS bacteriuria Treat in labor  4. Hx of pyelonephritis in third trimester:   Pt continues with  daily duricef  Preterm labor symptoms and general obstetric precautions including but not limited to vaginal bleeding, contractions, leaking of fluid and fetal movement were reviewed in detail with the patient.  Please refer to After Visit Summary for other counseling recommendations.   Return in about 2 weeks (around 03/26/2020) for [facility], in person, 36 weeks swabs.   Mariel Aloe, MD Faculty Attending Center for [facility]

## 2020-03-12 NOTE — Patient Instructions (Signed)

## 2020-03-12 NOTE — Progress Notes (Signed)
Patient reports fetal movement with irregular contractions. Pt reports that she has been having occasional discharge upon standing, declines vaginal swab today.

## 2020-03-17 ENCOUNTER — Inpatient Hospital Stay (HOSPITAL_COMMUNITY)
Admission: AD | Admit: 2020-03-17 | Discharge: 2020-03-17 | Disposition: A | Discharge status: Home / Self Care | Payer: Medicaid Other | Attending: Obstetrics & Gynecology | Admitting: Obstetrics & Gynecology

## 2020-03-17 ENCOUNTER — Other Ambulatory Visit: Payer: Self-pay

## 2020-03-17 ENCOUNTER — Encounter (HOSPITAL_COMMUNITY): Payer: Self-pay | Admitting: Obstetrics & Gynecology

## 2020-03-17 DIAGNOSIS — R030 Elevated blood-pressure reading, without diagnosis of hypertension: Secondary | ICD-10-CM | POA: Diagnosis not present

## 2020-03-17 DIAGNOSIS — Z880 Allergy status to penicillin: Secondary | ICD-10-CM | POA: Insufficient documentation

## 2020-03-17 DIAGNOSIS — O99353 Diseases of the nervous system complicating pregnancy, third trimester: Secondary | ICD-10-CM | POA: Diagnosis not present

## 2020-03-17 DIAGNOSIS — G44209 Tension-type headache, unspecified, not intractable: Secondary | ICD-10-CM | POA: Diagnosis not present

## 2020-03-17 DIAGNOSIS — Z87891 Personal history of nicotine dependence: Secondary | ICD-10-CM | POA: Insufficient documentation

## 2020-03-17 DIAGNOSIS — Z3689 Encounter for other specified antenatal screening: Secondary | ICD-10-CM | POA: Diagnosis not present

## 2020-03-17 DIAGNOSIS — Z79899 Other long term (current) drug therapy: Secondary | ICD-10-CM | POA: Insufficient documentation

## 2020-03-17 DIAGNOSIS — O26893 Other specified pregnancy related conditions, third trimester: Secondary | ICD-10-CM | POA: Diagnosis not present

## 2020-03-17 DIAGNOSIS — Z3A34 34 weeks gestation of pregnancy: Secondary | ICD-10-CM | POA: Insufficient documentation

## 2020-03-17 DIAGNOSIS — O10913 Unspecified pre-existing hypertension complicating pregnancy, third trimester: Secondary | ICD-10-CM | POA: Diagnosis not present

## 2020-03-17 DIAGNOSIS — Z8249 Family history of ischemic heart disease and other diseases of the circulatory system: Secondary | ICD-10-CM | POA: Diagnosis not present

## 2020-03-17 LAB — CBC
HCT: 29.6 % — ABNORMAL LOW (ref 36.0–46.0)
Hemoglobin: 9.7 g/dL — ABNORMAL LOW (ref 12.0–15.0)
MCH: 28.2 pg (ref 26.0–34.0)
MCHC: 32.8 g/dL (ref 30.0–36.0)
MCV: 86 fL (ref 80.0–100.0)
Platelets: 149 10*3/uL — ABNORMAL LOW (ref 150–400)
RBC: 3.44 MIL/uL — ABNORMAL LOW (ref 3.87–5.11)
RDW: 13.5 % (ref 11.5–15.5)
WBC: 13.3 10*3/uL — ABNORMAL HIGH (ref 4.0–10.5)
nRBC: 0 % (ref 0.0–0.2)

## 2020-03-17 LAB — COMPREHENSIVE METABOLIC PANEL
ALT: 15 U/L (ref 0–44)
AST: 21 U/L (ref 15–41)
Albumin: 2.7 g/dL — ABNORMAL LOW (ref 3.5–5.0)
Alkaline Phosphatase: 73 U/L (ref 38–126)
Anion gap: 9 (ref 5–15)
BUN: 5 mg/dL — ABNORMAL LOW (ref 6–20)
CO2: 19 mmol/L — ABNORMAL LOW (ref 22–32)
Calcium: 9.2 mg/dL (ref 8.9–10.3)
Chloride: 107 mmol/L (ref 98–111)
Creatinine, Ser: 0.5 mg/dL (ref 0.44–1.00)
GFR, Estimated: >60 mL/min (ref >60)
Glucose, Bld: 110 mg/dL — ABNORMAL HIGH (ref 70–99)
Potassium: 3.4 mmol/L — ABNORMAL LOW (ref 3.5–5.1)
Sodium: 135 mmol/L (ref 135–145)
Total Bilirubin: 0.7 mg/dL (ref 0.3–1.2)
Total Protein: 6.1 g/dL — ABNORMAL LOW (ref 6.5–8.1)

## 2020-03-17 LAB — URINALYSIS, ROUTINE W REFLEX MICROSCOPIC
Bilirubin Urine: NEGATIVE
Glucose, UA: NEGATIVE mg/dL
Hgb urine dipstick: NEGATIVE
Ketones, ur: NEGATIVE mg/dL
Nitrite: NEGATIVE
Protein, ur: NEGATIVE mg/dL
Specific Gravity, Urine: 1.005 (ref 1.005–1.030)
WBC, UA: >50 WBC/hpf — ABNORMAL HIGH (ref 0–5)
pH: 7 (ref 5.0–8.0)

## 2020-03-17 LAB — PROTEIN / CREATININE RATIO, URINE
Creatinine, Urine: 25.46 mg/dL
Protein Creatinine Ratio: 0.39 mg/mg{Cre} — ABNORMAL HIGH (ref 0.00–0.15)
Total Protein, Urine: 10 mg/dL

## 2020-03-17 MED ORDER — ACETAMINOPHEN 500 MG PO TABS
500.0000 mg | ORAL_TABLET | Freq: Once | ORAL | Status: AC
  Administered 2020-03-17: 500 mg via ORAL
  Filled 2020-03-17: qty 1

## 2020-03-17 NOTE — MAU Provider Note (Signed)
History     CSN: [id number]  Arrival date and time: 03/17/20 2035   Event Date/Time   First Provider Initiated Contact with Patient 03/17/20 2059      Chief Complaint  Patient presents with  . Hypertension   25 y.o. G2P1001 @34 .5 wks presenting with HA. HA started today. She checked her BP and was 130/87. Rates HA 6/10. She took Tylenol 500 mg 2 hrs ago and it helped some. Denies visual disturbances, RUQ pain, SOB, and CP. Also endorses abdominal tightening q5 min since 1500. Denies VB or LOF. Reports good FM.    OB History    Gravida  2   Para  1   Term  1   Preterm      AB      Living  1     SAB      IAB      Ectopic      Multiple  0   Live Births  1        Obstetric Comments  2 D&C's for retained products w/ first pregnancy        Past Medical History:  Diagnosis Date  . Anemia   . Pyelonephritis   . Seasonal allergies   . SVD (spontaneous vaginal delivery) 09/14/2017   x 1    Past Surgical History:  Procedure Laterality Date  . DILATION AND EVACUATION N/A 09/22/2017   Procedure: DILATATION AND EVACUATION;  Surgeon: 09/24/2017, MD;  Location: [facility];  Service: Gynecology;  Laterality: N/A;  . HYSTEROSCOPY WITH D & C N/A 12/19/2017   Procedure: DILATATION AND CURETTAGE /DIAGNOSTIC HYSTEROSCOPY;  Surgeon: 12/21/2017, MD;  Location: [facility];  Service: Gynecology;  Laterality: N/A;  rep will be here confirmed on 12/18/17  . MOUTH SURGERY     teeth ext  . WISDOM TOOTH EXTRACTION      Family History  Problem Relation Age of Onset  . Hypertension Mother   . Hypertension Other     Social History   Tobacco Use  . Smoking status: Former Smoker    Packs/day: 0.25    Years: 4.00    Pack years: 1.00    Types: Cigarettes  . Smokeless tobacco: Never Used  Vaping Use  . Vaping Use: Never used  Substance Use Topics  . Alcohol use: Not Currently    Comment: last drink was 1 month ago  . Drug use: No    Allergies:  Allergies   Allergen Reactions  . Amoxicillin Rash    Has patient had a PCN reaction causing immediate rash, facial/tongue/throat swelling, SOB or lightheadedness with hypotension: No Has patient had a PCN reaction causing severe rash involving mucus membranes or skin necrosis: No Has patient had a PCN reaction that required hospitalization: No Has patient had a PCN reaction occurring within the last 10 years: Yes If all of the above answers are "NO", then may proceed with Cephalosporin use.   12/20/17 Penicillins Rash    Has patient had a PCN reaction causing immediate rash, facial/tongue/throat swelling, SOB or lightheadedness with hypotension: No Has patient had a PCN reaction causing severe rash involving mucus membranes or skin necrosis: No Has patient had a PCN reaction that required hospitalization: No Has patient had a PCN reaction occurring within the last 10 years: Yes If all of the above answers are "NO", then may proceed with Cephalosporin use.     No medications prior to admission.    Review of Systems  Eyes: Negative  for visual disturbance.  Respiratory: Negative for shortness of breath.   Cardiovascular: Negative for chest pain.  Gastrointestinal: Positive for abdominal pain.  Genitourinary: Negative for vaginal bleeding and vaginal discharge.  Neurological: Positive for headaches.   Physical Exam   Blood pressure (!) 111/48, pulse (!) 105, temperature 98.5 F (36.9 C), temperature source Oral, resp. rate 18, last menstrual period 06/17/2019, SpO2 97 %. Patient Vitals for the past 24 hrs:  BP Temp Temp src Pulse Resp SpO2  03/17/20 2246 (!) 111/48 -- -- (!) 105 -- --  03/17/20 2230 (!) 114/50 -- -- (!) 102 -- 97 %  03/17/20 2216 (!) 113/50 -- -- (!) 105 -- --  03/17/20 2201 128/79 -- -- 100 -- --  03/17/20 2146 131/75 -- -- (!) 105 -- --  03/17/20 2131 126/82 -- -- (!) 101 -- --  03/17/20 2116 135/77 -- -- (!) 105 -- --  03/17/20 2052 138/88 -- -- (!) 120 -- --  03/17/20 2051  -- 98.5 F (36.9 C) Oral -- 18 100 %    Physical Exam Vitals and nursing note reviewed. Exam conducted with a chaperone present.  Pulmonary:     Effort: Pulmonary effort is normal. No respiratory distress.  Abdominal:     Palpations: Abdomen is soft.     Tenderness: There is no abdominal tenderness.     Comments: gravid  Genitourinary:    Comments: VE: closed/thick Musculoskeletal:        General: Normal range of motion.     Cervical back: Normal range of motion.  Skin:    General: Skin is warm and dry.  Neurological:     General: No focal deficit present.     Mental Status: She is oriented to person, place, and time.  Psychiatric:        Mood and Affect: Mood normal.        Behavior: Behavior normal.   EFM: 155 bpm, mod variability, + accels, no decels Toco: none  Results for orders placed or performed during the hospital encounter of 03/17/20 (from the past 24 hour(s))  Urinalysis, Routine w reflex microscopic Urine, Clean Catch     Status: Abnormal   Collection Time: 03/17/20  8:53 PM  Result Value Ref Range   Color, Urine YELLOW YELLOW   APPearance CLOUDY (A) CLEAR   Specific Gravity, Urine 1.005 1.005 - 1.030   pH 7.0 5.0 - 8.0   Glucose, UA NEGATIVE NEGATIVE mg/dL   Hgb urine dipstick NEGATIVE NEGATIVE   Bilirubin Urine NEGATIVE NEGATIVE   Ketones, ur NEGATIVE NEGATIVE mg/dL   Protein, ur NEGATIVE NEGATIVE mg/dL   Nitrite NEGATIVE NEGATIVE   Leukocytes,Ua LARGE (A) NEGATIVE   RBC / HPF 6-10 0 - 5 RBC/hpf   WBC, UA >50 (H) 0 - 5 WBC/hpf   Bacteria, UA MANY (A) NONE SEEN   Squamous Epithelial / LPF 0-5 0 - 5   Mucus PRESENT   Protein / creatinine ratio, urine     Status: Abnormal   Collection Time: 03/17/20  8:53 PM  Result Value Ref Range   Creatinine, Urine 25.46 mg/dL   Total Protein, Urine 10 mg/dL   Protein Creatinine Ratio 0.39 (H) 0.00 - 0.15 mg/mg[Cre]  CBC     Status: Abnormal   Collection Time: 03/17/20  9:11 PM  Result Value Ref Range   WBC  13.3 (H) 4.0 - 10.5 K/uL   RBC 3.44 (L) 3.87 - 5.11 MIL/uL   Hemoglobin 9.7 (L) 12.0 - 15.0 g/dL  HCT 29.6 (L) 36.0 - 46.0 %   MCV 86.0 80.0 - 100.0 fL   MCH 28.2 26.0 - 34.0 pg   MCHC 32.8 30.0 - 36.0 g/dL   RDW 15.4 00.8 - 67.6 %   Platelets 149 (L) 150 - 400 K/uL   nRBC 0.0 0.0 - 0.2 %  Comprehensive metabolic panel     Status: Abnormal   Collection Time: 03/17/20  9:11 PM  Result Value Ref Range   Sodium 135 135 - 145 mmol/L   Potassium 3.4 (L) 3.5 - 5.1 mmol/L   Chloride 107 98 - 111 mmol/L   CO2 19 (L) 22 - 32 mmol/L   Glucose, Bld 110 (H) 70 - 99 mg/dL   BUN 5 (L) 6 - 20 mg/dL   Creatinine, Ser 1.95 0.44 - 1.00 mg/dL   Calcium 9.2 8.9 - 09.3 mg/dL   Total Protein 6.1 (L) 6.5 - 8.1 g/dL   Albumin 2.7 (L) 3.5 - 5.0 g/dL   AST 21 15 - 41 U/L   ALT 15 0 - 44 U/L   Alkaline Phosphatase 73 38 - 126 U/L   Total Bilirubin 0.7 0.3 - 1.2 mg/dL   GFR, Estimated >26 >71 mL/min   Anion gap 9 5 - 15   MAU Course  Procedures Tylenol 500 mg  MDM Labs ordered and reviewed. HA resolved. Elevated PCR but no signs of PEC. Stable for discharge home.   Assessment and Plan   1. [redacted] weeks gestation of pregnancy   2. NST (non-stress test) reactive   3. Acute non intractable tension-type headache    Discharge home Follow up at [facility] as scheduled PEC precautions  Allergies as of 03/17/2020      Reactions   Amoxicillin Rash   Has patient had a PCN reaction causing immediate rash, facial/tongue/throat swelling, SOB or lightheadedness with hypotension: No Has patient had a PCN reaction causing severe rash involving mucus membranes or skin necrosis: No Has patient had a PCN reaction that required hospitalization: No Has patient had a PCN reaction occurring within the last 10 years: Yes If all of the above answers are "NO", then may proceed with Cephalosporin use.   Penicillins Rash   Has patient had a PCN reaction causing immediate rash, facial/tongue/throat swelling, SOB or  lightheadedness with hypotension: No Has patient had a PCN reaction causing severe rash involving mucus membranes or skin necrosis: No Has patient had a PCN reaction that required hospitalization: No Has patient had a PCN reaction occurring within the last 10 years: Yes If all of the above answers are "NO", then may proceed with Cephalosporin use.      Medication List    STOP taking these medications   oxyCODONE 5 MG immediate release tablet Commonly known as: Oxy IR/ROXICODONE     TAKE these medications   cefadroxil 500 MG capsule Commonly known as: DURICEF Take 1 capsule (500 mg total) by mouth daily. For suppression until after delivery.   cyclobenzaprine 10 MG tablet Commonly known as: FLEXERIL Take 10 mg by mouth 3 (three) times daily as needed for muscle spasms.   ferrous sulfate 325 (65 FE) MG tablet Take 1 tablet (325 mg total) by mouth every other day.   Prenatal Gummies/DHA & FA 0.4-32.5 MG Chew Chew by mouth.      Donette Larry, CNM 03/18/2020, 12:40 AM

## 2020-03-17 NOTE — Discharge Instructions (Signed)
Tension Headache, Adult A tension headache is a feeling of pain, pressure, or aching in the head. It is often felt over the front and sides of the head. Tension headaches can last from 30 minutes to several days. What are the causes? The cause of this condition is not known. Sometimes, tension headaches are brought on by stress, worry (anxiety), or depression. Other things that may set them off include:  Alcohol.  Too much caffeine or caffeine withdrawal.  Colds, flu, or sinus infections.  Dental problems. This can include clenching your teeth.  Being tired.  Holding your head and neck in the same position for a long time, such as while using a computer.  Smoking.  Arthritis in the neck. What are the signs or symptoms?  Feeling pressure around the head.  A dull ache in the head.  Pain over the front and sides of the head.  Feeling sore or tender in the muscles of the head, neck, and shoulders. How is this treated? This condition may be treated with lifestyle changes and with medicines that help relieve symptoms. Follow these instructions at home: Managing pain  Take over-the-counter and prescription medicines only as told by your doctor.  When you have a headache, lie down in a dark, quiet room.  If told, put ice on your head and neck. To do this: ? Put ice in a plastic bag. ? Place a towel between your skin and the bag. ? Leave the ice on for 20 minutes, 2-3 times a day. ? Take off the ice if your skin turns bright red. This is very important. If you cannot feel pain, heat, or cold, you have a greater risk of damage to the area.  If told, put heat on the back of your neck. Do this as often as told by your doctor. Use the heat source that your doctor recommends, such as a moist heat pack or a heating pad. ? Place a towel between your skin and the heat source. ? Leave the heat on for 20-30 minutes. ? Take off the heat if your skin turns bright red. This is very  important. If you cannot feel pain, heat, or cold, you have a greater risk of getting burned. Eating and drinking  Eat meals on a regular schedule.  If you drink alcohol: ? Limit how much you have to:  0-1 drink a day for women who are not pregnant.  0-2 drinks a day for men. ? Know how much alcohol is in your drink. In the [place] one drink equals one 12 oz bottle of beer (355 mL), one 5 oz glass of wine (148 mL), or one 1 oz glass of hard liquor (44 mL).  Drink enough fluid to keep your pee (urine) pale yellow.  Do not use a lot of caffeine, or stop using caffeine. Lifestyle  Get 7-9 hours of sleep each night. Or get the amount of sleep that your doctor tells you to.  At bedtime, keep computers, phones, and tablets out of your room.  Find ways to lessen your stress. This may include: ? Exercise. ? Deep breathing. ? Yoga. ? Listening to music. ? Thinking positive thoughts.  Sit up straight. Try to relax your muscles.  Do not smoke or use any products that contain nicotine or tobacco. If you need help quitting, ask your doctor. General instructions  Avoid things that can bring on headaches. Keep a headache journal to see what may bring on headaches. For example, write down: ?   What you eat and drink. ? How much sleep you get. ? Any change to your diet or medicines.  Keep all follow-up visits.   Contact a doctor if:  Your headache does not get better.  Your headache comes back.  You have a headache, and sounds, light, or smells bother you.  You feel like you may vomit, or you vomit.  Your stomach hurts. Get help right away if:  You all of a sudden get a very bad headache with any of these things: ? A stiff neck. ? Feeling like you may vomit. ? Vomiting. ? Feeling mixed up (confused). ? Feeling weak in one part or one side of your body. ? Having trouble seeing or speaking, or both. ? Feeling short of breath. ? A rash. ? Feeling very sleepy. ? Pain in your  eye or ear. ? Trouble walking or balancing. ? Feeling like you will faint, or you faint. Summary  A tension headache is pain, pressure, or aching in your head.  Tension headaches can last from 30 minutes to several days.  Lifestyle changes and medicines may help relieve pain. This information is not intended to replace advice given to you by your health care provider. Make sure you discuss any questions you have with your health care provider. Document Revised: 10/17/2019 Document Reviewed: 10/17/2019 Elsevier Patient Education  2021 Elsevier Inc.  

## 2020-03-17 NOTE — MAU Note (Signed)
Pt reports she took her blood pressure at home and it was 137/88.   Pt called the nurses line and was told to come in.   Reports headache, denies visual changes.   Denies vaginal bleeding or LOF.   Reports +FM

## 2020-03-19 LAB — CULTURE, OB URINE: Culture: 70000 — AB

## 2020-03-26 ENCOUNTER — Other Ambulatory Visit (HOSPITAL_COMMUNITY)
Admission: RE | Admit: 2020-03-26 | Discharge: 2020-03-26 | Disposition: A | Discharge status: Home / Self Care | Payer: Medicaid Other | Source: Ambulatory Visit | Attending: Obstetrics and Gynecology | Admitting: Obstetrics and Gynecology

## 2020-03-26 ENCOUNTER — Encounter: Payer: Self-pay | Admitting: Obstetrics and Gynecology

## 2020-03-26 ENCOUNTER — Ambulatory Visit (INDEPENDENT_AMBULATORY_CARE_PROVIDER_SITE_OTHER): Payer: Medicaid Other | Admitting: Obstetrics and Gynecology

## 2020-03-26 ENCOUNTER — Other Ambulatory Visit: Payer: Self-pay

## 2020-03-26 VITALS — BP 126/85 | HR 105 | Wt 222.0 lb

## 2020-03-26 DIAGNOSIS — Z3480 Encounter for supervision of other normal pregnancy, unspecified trimester: Secondary | ICD-10-CM | POA: Insufficient documentation

## 2020-03-26 DIAGNOSIS — R8271 Bacteriuria: Secondary | ICD-10-CM

## 2020-03-26 DIAGNOSIS — O2303 Infections of kidney in pregnancy, third trimester: Secondary | ICD-10-CM

## 2020-03-26 NOTE — Progress Notes (Signed)
   PRENATAL VISIT NOTE  Subjective:  Sherry Leach is a 25 y.o. G2P1001 at [redacted]w[redacted]d being seen today for ongoing prenatal care.  She is currently monitored for the following issues for this low-risk pregnancy and has Chlamydia trachomatis infection in pregnancy in second trimester; Supervision of other normal pregnancy, antepartum; GBS (group b Streptococcus) UTI complicating pregnancy, second trimester; Pyelonephritis affecting pregnancy in third trimester; [redacted] weeks gestation of pregnancy; and GBS bacteriuria on their problem list.  Patient reports no complaints.  Contractions: Irritability. Vag. Bleeding: None.  Movement: Present. Denies leaking of fluid.   The following portions of the patient's history were reviewed and updated as appropriate: allergies, current medications, past family history, past medical history, past social history, past surgical history and problem list.   Objective:   Vitals:   03/26/20 1600  BP: 126/85  Pulse: (!) 105  Weight: 222 lb (100.7 kg)    Fetal Status: Fetal Heart Rate (bpm): 152   Movement: Present     General:  Alert, oriented and cooperative. Patient is in no acute distress.  Skin: Skin is warm and dry. No rash noted.   Cardiovascular: Normal heart rate noted  Respiratory: Normal respiratory effort, no problems with respiration noted  Abdomen: Soft, gravid, appropriate for gestational age.  Pain/Pressure: Present     Pelvic: Cervical exam performed in the presence of a chaperone        Extremities: Normal range of motion.  Edema: None  Mental Status: Normal mood and affect. Normal behavior. Normal judgment and thought content.   Assessment and Plan:  Pregnancy: G2P1001 at [redacted]w[redacted]d 1. Supervision of other normal pregnancy, antepartum Patient is doing well without complaints Cultures today Reassurance provided regarding BP but asked to continue monitoring Patient desires depo-provera for contraception. Patient is considering Nexplanon or IUD  2.  GBS bacteriuria Prophylaxis in labor  3. Pyelonephritis affecting pregnancy in third trimester Continue daily antibiotic for suppression  Preterm labor symptoms and general obstetric precautions including but not limited to vaginal bleeding, contractions, leaking of fluid and fetal movement were reviewed in detail with the patient. Please refer to After Visit Summary for other counseling recommendations.   Return in about 1 week (around 04/02/2020) for in person, ROB, Low risk.  No future appointments.  Catalina Antigua, MD

## 2020-03-26 NOTE — Progress Notes (Signed)
ROB 36w  Already GBS+ in urine  Pt notes elevated B/P at home 140/67 P: 112 was B/P reading at home.

## 2020-03-27 LAB — CERVICOVAGINAL ANCILLARY ONLY
Chlamydia: NEGATIVE
Comment: NEGATIVE
Comment: NORMAL
Neisseria Gonorrhea: NEGATIVE

## 2020-04-01 ENCOUNTER — Other Ambulatory Visit: Payer: Self-pay

## 2020-04-01 ENCOUNTER — Inpatient Hospital Stay (HOSPITAL_COMMUNITY)
Admission: AD | Admit: 2020-04-01 | Discharge: 2020-04-03 | DRG: 807 | Disposition: A | Discharge status: Home / Self Care | Payer: Medicaid Other | Attending: Obstetrics & Gynecology | Admitting: Obstetrics & Gynecology

## 2020-04-01 DIAGNOSIS — Z881 Allergy status to other antibiotic agents status: Secondary | ICD-10-CM

## 2020-04-01 DIAGNOSIS — Z880 Allergy status to penicillin: Secondary | ICD-10-CM

## 2020-04-01 DIAGNOSIS — O1490 Unspecified pre-eclampsia, unspecified trimester: Secondary | ICD-10-CM | POA: Diagnosis present

## 2020-04-01 DIAGNOSIS — Z6791 Unspecified blood type, Rh negative: Secondary | ICD-10-CM

## 2020-04-01 DIAGNOSIS — O26899 Other specified pregnancy related conditions, unspecified trimester: Secondary | ICD-10-CM

## 2020-04-01 DIAGNOSIS — Z3480 Encounter for supervision of other normal pregnancy, unspecified trimester: Secondary | ICD-10-CM

## 2020-04-01 DIAGNOSIS — Z87891 Personal history of nicotine dependence: Secondary | ICD-10-CM

## 2020-04-01 DIAGNOSIS — R8271 Bacteriuria: Secondary | ICD-10-CM | POA: Diagnosis present

## 2020-04-01 DIAGNOSIS — O0990 Supervision of high risk pregnancy, unspecified, unspecified trimester: Secondary | ICD-10-CM

## 2020-04-01 DIAGNOSIS — O1404 Mild to moderate pre-eclampsia, complicating childbirth: Principal | ICD-10-CM | POA: Diagnosis present

## 2020-04-01 DIAGNOSIS — Z3A37 37 weeks gestation of pregnancy: Secondary | ICD-10-CM

## 2020-04-01 DIAGNOSIS — O99824 Streptococcus B carrier state complicating childbirth: Secondary | ICD-10-CM | POA: Diagnosis present

## 2020-04-01 DIAGNOSIS — O26893 Other specified pregnancy related conditions, third trimester: Secondary | ICD-10-CM | POA: Diagnosis present

## 2020-04-02 ENCOUNTER — Inpatient Hospital Stay (HOSPITAL_COMMUNITY): Payer: Medicaid Other | Admitting: Anesthesiology

## 2020-04-02 ENCOUNTER — Encounter: Payer: Medicaid Other | Admitting: Obstetrics and Gynecology

## 2020-04-02 ENCOUNTER — Encounter (HOSPITAL_COMMUNITY): Payer: Self-pay | Admitting: Obstetrics & Gynecology

## 2020-04-02 DIAGNOSIS — Z3A37 37 weeks gestation of pregnancy: Secondary | ICD-10-CM | POA: Diagnosis not present

## 2020-04-02 DIAGNOSIS — O26899 Other specified pregnancy related conditions, unspecified trimester: Secondary | ICD-10-CM

## 2020-04-02 DIAGNOSIS — Z880 Allergy status to penicillin: Secondary | ICD-10-CM | POA: Diagnosis not present

## 2020-04-02 DIAGNOSIS — Z6791 Unspecified blood type, Rh negative: Secondary | ICD-10-CM | POA: Diagnosis not present

## 2020-04-02 DIAGNOSIS — O1404 Mild to moderate pre-eclampsia, complicating childbirth: Secondary | ICD-10-CM | POA: Diagnosis not present

## 2020-04-02 DIAGNOSIS — O99824 Streptococcus B carrier state complicating childbirth: Secondary | ICD-10-CM | POA: Diagnosis not present

## 2020-04-02 DIAGNOSIS — O1490 Unspecified pre-eclampsia, unspecified trimester: Secondary | ICD-10-CM | POA: Diagnosis present

## 2020-04-02 DIAGNOSIS — O0990 Supervision of high risk pregnancy, unspecified, unspecified trimester: Secondary | ICD-10-CM

## 2020-04-02 DIAGNOSIS — O26893 Other specified pregnancy related conditions, third trimester: Secondary | ICD-10-CM | POA: Diagnosis not present

## 2020-04-02 DIAGNOSIS — Z881 Allergy status to other antibiotic agents status: Secondary | ICD-10-CM | POA: Diagnosis not present

## 2020-04-02 DIAGNOSIS — Z87891 Personal history of nicotine dependence: Secondary | ICD-10-CM | POA: Diagnosis not present

## 2020-04-02 DIAGNOSIS — O1494 Unspecified pre-eclampsia, complicating childbirth: Secondary | ICD-10-CM | POA: Diagnosis not present

## 2020-04-02 LAB — CBC
HCT: 35.3 % — ABNORMAL LOW (ref 36.0–46.0)
HCT: 32.7 % — ABNORMAL LOW (ref 36.0–46.0)
Hemoglobin: 11.1 g/dL — ABNORMAL LOW (ref 12.0–15.0)
Hemoglobin: 10.1 g/dL — ABNORMAL LOW (ref 12.0–15.0)
MCH: 26.6 pg (ref 26.0–34.0)
MCH: 26.2 pg (ref 26.0–34.0)
MCHC: 31.4 g/dL (ref 30.0–36.0)
MCHC: 30.9 g/dL (ref 30.0–36.0)
MCV: 84.7 fL (ref 80.0–100.0)
MCV: 84.9 fL (ref 80.0–100.0)
Platelets: 185 10*3/uL (ref 150–400)
Platelets: 165 10*3/uL (ref 150–400)
RBC: 4.17 MIL/uL (ref 3.87–5.11)
RBC: 3.85 MIL/uL — ABNORMAL LOW (ref 3.87–5.11)
RDW: 14 % (ref 11.5–15.5)
RDW: 14 % (ref 11.5–15.5)
WBC: 11.7 10*3/uL — ABNORMAL HIGH (ref 4.0–10.5)
WBC: 14 10*3/uL — ABNORMAL HIGH (ref 4.0–10.5)
nRBC: 0 % (ref 0.0–0.2)
nRBC: 0 % (ref 0.0–0.2)

## 2020-04-02 LAB — COMPREHENSIVE METABOLIC PANEL
ALT: 16 U/L (ref 0–44)
AST: 25 U/L (ref 15–41)
Albumin: 3.1 g/dL — ABNORMAL LOW (ref 3.5–5.0)
Alkaline Phosphatase: 91 U/L (ref 38–126)
Anion gap: 10 (ref 5–15)
BUN: <5 mg/dL — ABNORMAL LOW (ref 6–20)
CO2: 18 mmol/L — ABNORMAL LOW (ref 22–32)
Calcium: 9.4 mg/dL (ref 8.9–10.3)
Chloride: 106 mmol/L (ref 98–111)
Creatinine, Ser: 0.62 mg/dL (ref 0.44–1.00)
GFR, Estimated: >60 mL/min (ref >60)
Glucose, Bld: 98 mg/dL (ref 70–99)
Potassium: 4.3 mmol/L (ref 3.5–5.1)
Sodium: 134 mmol/L — ABNORMAL LOW (ref 135–145)
Total Bilirubin: 0.6 mg/dL (ref 0.3–1.2)
Total Protein: 7 g/dL (ref 6.5–8.1)

## 2020-04-02 LAB — TYPE AND SCREEN
ABO/RH(D): O NEG
Antibody Screen: NEGATIVE

## 2020-04-02 LAB — RPR: RPR Ser Ql: NONREACTIVE

## 2020-04-02 LAB — PROTEIN / CREATININE RATIO, URINE
Creatinine, Urine: 39.23 mg/dL
Total Protein, Urine: <6 mg/dL

## 2020-04-02 MED ORDER — IBUPROFEN 600 MG PO TABS
600.0000 mg | ORAL_TABLET | Freq: Four times a day (QID) | ORAL | Status: DC
  Administered 2020-04-02 – 2020-04-03 (×4): 600 mg via ORAL
  Filled 2020-04-02 (×4): qty 1

## 2020-04-02 MED ORDER — CEFAZOLIN SODIUM-DEXTROSE 1-4 GM/50ML-% IV SOLN
1.0000 g | Freq: Three times a day (TID) | INTRAVENOUS | Status: DC
  Administered 2020-04-02: 1 g via INTRAVENOUS
  Filled 2020-04-02 (×2): qty 50

## 2020-04-02 MED ORDER — LIDOCAINE HCL (PF) 1 % IJ SOLN
INTRAMUSCULAR | Status: DC | PRN
  Administered 2020-04-02: 5 mL via EPIDURAL

## 2020-04-02 MED ORDER — OXYCODONE-ACETAMINOPHEN 5-325 MG PO TABS: 1.0000 | ORAL_TABLET | ORAL | Status: DC | PRN

## 2020-04-02 MED ORDER — OXYTOCIN-SODIUM CHLORIDE 30-0.9 UT/500ML-% IV SOLN: 2.5000 [IU]/h | INTRAVENOUS | Status: DC

## 2020-04-02 MED ORDER — OXYCODONE-ACETAMINOPHEN 5-325 MG PO TABS
2.0000 | ORAL_TABLET | ORAL | Status: DC | PRN
Start: 2020-04-02 — End: 2020-04-02

## 2020-04-02 MED ORDER — TETANUS-DIPHTH-ACELL PERTUSSIS 5-2.5-18.5 LF-MCG/0.5 IM SUSY
0.5000 mL | PREFILLED_SYRINGE | Freq: Once | INTRAMUSCULAR | Status: DC
Start: 2020-04-03 — End: 2020-04-03

## 2020-04-02 MED ORDER — MEDROXYPROGESTERONE ACETATE 150 MG/ML IM SUSP: 150.0000 mg | INTRAMUSCULAR | Status: DC | PRN

## 2020-04-02 MED ORDER — PHENYLEPHRINE 40 MCG/ML (10ML) SYRINGE FOR IV PUSH (FOR BLOOD PRESSURE SUPPORT): 80.0000 ug | PREFILLED_SYRINGE | INTRAVENOUS | Status: DC | PRN

## 2020-04-02 MED ORDER — LIDOCAINE HCL (PF) 1 % IJ SOLN: 30.0000 mL | INTRAMUSCULAR | Status: DC | PRN

## 2020-04-02 MED ORDER — ONDANSETRON HCL 4 MG/2ML IJ SOLN: 4.0000 mg | Freq: Four times a day (QID) | INTRAMUSCULAR | Status: DC | PRN

## 2020-04-02 MED ORDER — COCONUT OIL OIL: 1.0000 "application " | TOPICAL_OIL | Status: DC | PRN

## 2020-04-02 MED ORDER — SENNOSIDES-DOCUSATE SODIUM 8.6-50 MG PO TABS
2.0000 | ORAL_TABLET | ORAL | Status: DC
  Administered 2020-04-02: 2 via ORAL
  Filled 2020-04-02: qty 2

## 2020-04-02 MED ORDER — FENTANYL-BUPIVACAINE-NACL 0.5-0.125-0.9 MG/250ML-% EP SOLN
EPIDURAL | Status: DC | PRN
  Administered 2020-04-02: 12 mL/h via EPIDURAL

## 2020-04-02 MED ORDER — FENTANYL-BUPIVACAINE-NACL 0.5-0.125-0.9 MG/250ML-% EP SOLN
12.0000 mL/h | EPIDURAL | Status: DC | PRN
  Filled 2020-04-02: qty 250

## 2020-04-02 MED ORDER — MISOPROSTOL 50MCG HALF TABLET
50.0000 ug | ORAL_TABLET | Freq: Once | ORAL | Status: AC
  Administered 2020-04-02: 50 ug via BUCCAL

## 2020-04-02 MED ORDER — EPHEDRINE 5 MG/ML INJ: 10.0000 mg | INTRAVENOUS | Status: DC | PRN

## 2020-04-02 MED ORDER — SIMETHICONE 80 MG PO CHEW: 80.0000 mg | CHEWABLE_TABLET | ORAL | Status: DC | PRN

## 2020-04-02 MED ORDER — ZOLPIDEM TARTRATE 5 MG PO TABS: 5.0000 mg | ORAL_TABLET | Freq: Every evening | ORAL | Status: DC | PRN

## 2020-04-02 MED ORDER — ONDANSETRON HCL 4 MG PO TABS: 4.0000 mg | ORAL_TABLET | ORAL | Status: DC | PRN

## 2020-04-02 MED ORDER — OXYTOCIN-SODIUM CHLORIDE 30-0.9 UT/500ML-% IV SOLN
1.0000 m[IU]/min | INTRAVENOUS | Status: DC
  Administered 2020-04-02: 2 m[IU]/min via INTRAVENOUS
  Filled 2020-04-02: qty 500

## 2020-04-02 MED ORDER — DIPHENHYDRAMINE HCL 25 MG PO CAPS: 25.0000 mg | ORAL_CAPSULE | Freq: Four times a day (QID) | ORAL | Status: DC | PRN

## 2020-04-02 MED ORDER — CEFAZOLIN SODIUM-DEXTROSE 1-4 GM/50ML-% IV SOLN: 1.0000 g | Freq: Three times a day (TID) | INTRAVENOUS | Status: DC

## 2020-04-02 MED ORDER — OXYTOCIN BOLUS FROM INFUSION
333.0000 mL | Freq: Once | INTRAVENOUS | Status: AC
  Administered 2020-04-02: 333 mL via INTRAVENOUS

## 2020-04-02 MED ORDER — FENTANYL CITRATE (PF) 100 MCG/2ML IJ SOLN
INTRAMUSCULAR | Status: AC
  Filled 2020-04-02: qty 2

## 2020-04-02 MED ORDER — PRENATAL MULTIVITAMIN CH
1.0000 | ORAL_TABLET | Freq: Every day | ORAL | Status: DC
  Administered 2020-04-03: 1 via ORAL
  Filled 2020-04-02: qty 1

## 2020-04-02 MED ORDER — FENTANYL CITRATE (PF) 100 MCG/2ML IJ SOLN
100.0000 ug | INTRAMUSCULAR | Status: DC | PRN
  Administered 2020-04-02: 100 ug via INTRAVENOUS

## 2020-04-02 MED ORDER — BENZOCAINE-MENTHOL 20-0.5 % EX AERO
1.0000 "application " | INHALATION_SPRAY | CUTANEOUS | Status: DC | PRN
  Administered 2020-04-02: 1 via TOPICAL
  Filled 2020-04-02 (×2): qty 56

## 2020-04-02 MED ORDER — LACTATED RINGERS IV SOLN: 500.0000 mL | INTRAVENOUS | Status: DC | PRN

## 2020-04-02 MED ORDER — DIBUCAINE (PERIANAL) 1 % EX OINT: 1.0000 "application " | TOPICAL_OINTMENT | CUTANEOUS | Status: DC | PRN

## 2020-04-02 MED ORDER — TERBUTALINE SULFATE 1 MG/ML IJ SOLN: 0.2500 mg | Freq: Once | INTRAMUSCULAR | Status: DC | PRN

## 2020-04-02 MED ORDER — SOD CITRATE-CITRIC ACID 500-334 MG/5ML PO SOLN: 30.0000 mL | ORAL | Status: DC | PRN

## 2020-04-02 MED ORDER — CEFAZOLIN SODIUM-DEXTROSE 2-4 GM/100ML-% IV SOLN
2.0000 g | Freq: Once | INTRAVENOUS | Status: AC
  Administered 2020-04-02: 2 g via INTRAVENOUS
  Filled 2020-04-02: qty 100

## 2020-04-02 MED ORDER — WITCH HAZEL-GLYCERIN EX PADS: 1.0000 "application " | MEDICATED_PAD | CUTANEOUS | Status: DC | PRN

## 2020-04-02 MED ORDER — ACETAMINOPHEN 325 MG PO TABS: 650.0000 mg | ORAL_TABLET | ORAL | Status: DC | PRN

## 2020-04-02 MED ORDER — LACTATED RINGERS IV SOLN: INTRAVENOUS | Status: DC

## 2020-04-02 MED ORDER — ONDANSETRON HCL 4 MG/2ML IJ SOLN: 4.0000 mg | INTRAMUSCULAR | Status: DC | PRN

## 2020-04-02 MED ORDER — LACTATED RINGERS IV SOLN
500.0000 mL | Freq: Once | INTRAVENOUS | Status: AC
  Administered 2020-04-02: 500 mL via INTRAVENOUS

## 2020-04-02 MED ORDER — PHENYLEPHRINE 40 MCG/ML (10ML) SYRINGE FOR IV PUSH (FOR BLOOD PRESSURE SUPPORT)
80.0000 ug | PREFILLED_SYRINGE | INTRAVENOUS | Status: DC | PRN
  Filled 2020-04-02: qty 10

## 2020-04-02 MED ORDER — DIPHENHYDRAMINE HCL 50 MG/ML IJ SOLN: 12.5000 mg | INTRAMUSCULAR | Status: DC | PRN

## 2020-04-02 MED ORDER — MISOPROSTOL 50MCG HALF TABLET
ORAL_TABLET | ORAL | Status: AC
  Filled 2020-04-02: qty 1

## 2020-04-02 MED ORDER — ACETAMINOPHEN 325 MG PO TABS
650.0000 mg | ORAL_TABLET | ORAL | Status: DC | PRN
  Administered 2020-04-02: 650 mg via ORAL
  Filled 2020-04-02: qty 2

## 2020-04-02 NOTE — Lactation Note (Signed)
This note was copied from a baby's chart. Lactation Consultation Note  Patient Name: Girl Paris Hohn CHEKB'T Date: 04/02/2020  [facility] talked to RN, Eulogio Bear, to see if Mom wanted assistance with latching. RN states Mom will be formula feeding only.  Age:24 hours  Maternal Data    Feeding Mother's Current Feeding Choice: Formula  LATCH Score                    Lactation Tools Discussed/Used    Interventions    Discharge    Consult Status Consult Status: Complete    Neviah Braud  Nicholson-Springer 04/02/2020, 3:06 PM

## 2020-04-02 NOTE — H&P (Signed)
OBSTETRIC ADMISSION HISTORY AND PHYSICAL  Sherry Leach is a 25 y.o. female G2P1001 with IUP at [redacted]w[redacted]d by 6 week ultrasound presenting for IOL secondary to preeclampsia without severe features. She reports +FMs, No LOF, no VB, no blurry vision, headaches or peripheral edema, and RUQ pain.  She plans on breast and bottle feeding. She request depo for birth control.  She received her prenatal care at [facility]   Dating: By 6 week ultrasound --->  Estimated Date of Delivery: 04/23/20  Sono: @[redacted]w[redacted]d , CWD, normal anatomy, cephalic presentation,  2440g, 62% EFW  Prenatal History/Complications:  - h/o pyelonephritis (diagnosed 02/05/20; s/p 14-day course of bactrim) - h/o Chlamydia (s/p negative TOC) - preeclampsia w/o severe features (UP:C 0.39 on 03/17/20; several mild range blood pressures in clinic on 03/04/20 and now mild range blood pressure on arrival to MAU prior to admission) - Rh negative (s/p rhogam in pregnancy) - 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 Anemia   . Pyelonephritis   . Seasonal allergies   . SVD (spontaneous vaginal delivery) 09/14/2017   x 1    Past Surgical History: Past Surgical History:  Procedure Laterality Date  . DILATION AND EVACUATION N/A 09/22/2017   Procedure: DILATATION AND EVACUATION;  Surgeon: 09/24/2017, MD;  Location: [facility];  Service: Gynecology;  Laterality: N/A;  . HYSTEROSCOPY WITH D & C N/A 12/19/2017   Procedure: DILATATION AND CURETTAGE /DIAGNOSTIC HYSTEROSCOPY;  Surgeon: 12/21/2017, MD;  Location: [facility];  Service: Gynecology;  Laterality: N/A;  rep will be here confirmed on 12/18/17  . MOUTH SURGERY     teeth ext  . WISDOM TOOTH EXTRACTION      Obstetrical History: OB History    Gravida  2   Para  1   Term  1   Preterm      AB      Living  1     SAB      IAB      Ectopic      Multiple  0   Live  Births  1        Obstetric Comments  2 D&C's for retained products w/ first pregnancy        Social History Social History   Socioeconomic History  . Marital status: Single    Spouse name: Not on file  . Number of children: 1  . Years of education: Not on file  . Highest education level: Not on file  Occupational History  . Not on file  Tobacco Use  . Smoking status: Former Smoker    Packs/day: 0.25    Years: 4.00    Pack years: 1.00    Types: Cigarettes  . Smokeless tobacco: Never Used  Vaping Use  . Vaping Use: Never used  Substance and Sexual Activity  . Alcohol use: Not Currently    Comment: last drink was 1 month ago  . Drug use: No  . Sexual activity: Not Currently    Partners: Male    Birth control/protection: None    Comment: last intercourse over 1 month ago  Other Topics Concern  . Not on file  Social History Narrative  . Not on file   Social Determinants of Health   Financial Resource Strain: Not on file  Food Insecurity: Not on file  Transportation Needs: Not on file  Physical Activity: Not on file  Stress:  Not on file  Social Connections: Not on file    Family History: Family History  Problem Relation Age of Onset  . Hypertension Mother   . Hypertension Other     Allergies: Allergies  Allergen Reactions  . Amoxicillin Rash    Has patient had a PCN reaction causing immediate rash, facial/tongue/throat swelling, SOB or lightheadedness with hypotension: No Has patient had a PCN reaction causing severe rash involving mucus membranes or skin necrosis: No Has patient had a PCN reaction that required hospitalization: No Has patient had a PCN reaction occurring within the last 10 years: Yes If all of the above answers are "NO", then may proceed with Cephalosporin use.   Marland Kitchen Penicillins Rash    Has patient had a PCN reaction causing immediate rash, facial/tongue/throat swelling, SOB or lightheadedness with hypotension: No Has patient had a PCN  reaction causing severe rash involving mucus membranes or skin necrosis: No Has patient had a PCN reaction that required hospitalization: No Has patient had a PCN reaction occurring within the last 10 years: Yes If all of the above answers are "NO", then may proceed with Cephalosporin use.     Medications Prior to Admission  Medication Sig Dispense Refill Last Dose  . cefadroxil (DURICEF) 500 MG capsule Take 1 capsule (500 mg total) by mouth daily. For suppression until after delivery. 30 capsule 1 04/02/2020 at Unknown time  . cyclobenzaprine (FLEXERIL) 10 MG tablet Take 10 mg by mouth 3 (three) times daily as needed for muscle spasms.     . ferrous sulfate 325 (65 FE) MG tablet Take 1 tablet (325 mg total) by mouth every other day. 60 tablet 5   . Prenatal MV-Min-FA-Omega-3 (PRENATAL GUMMIES/DHA & FA) 0.4-32.5 MG CHEW Chew by mouth.        Review of Systems   All systems reviewed and negative except as stated in HPI  Blood pressure 128/86, pulse 90, temperature 98.3 F (36.8 C), resp. rate 16, last menstrual period 06/17/2019, SpO2 99 %. General appearance: alert, cooperative and appears stated age Lungs: normal WOB Heart: regular rate  Abdomen: soft, non-tender Extremities: no sign of DVT Presentation: cephalic Fetal monitoringBaseline: 145 bpm, Variability: Good {> 6 bpm), Accelerations: Reactive and Decelerations: intermittent variable decels Uterine activity: irregular Dilation: 1 Effacement (%): 50 Station: -3 Exam by:: Quintella Baton RNC  Prenatal labs: ABO, Rh: --/--/O NEG (01/05 1646) Antibody: NEG (01/05 1646) Rubella: 1.00 (08/31 1558) RPR: Non Reactive (01/19 1018)  HBsAg: Negative (08/31 1558)  HIV: Non Reactive (01/19 1018)  GBS:   positive 2 hr Glucola wnl Genetic screening  wnl Anatomy US wnl  Prenatal Transfer Tool  Maternal Diabetes: No Genetic Screening: Normal Maternal Ultrasounds/Referrals: Normal Fetal Ultrasounds or other Referrals:  None Maternal  Substance Abuse:  No Significant Maternal Medications:  None Significant Maternal Lab Results: Group B Strep positive  No results found for this or any previous visit (from the past 24 hour(s)).  Patient Active Problem List   Diagnosis Date Noted  . Supervision of high risk pregnancy, antepartum 04/02/2020  . [redacted] weeks gestation of pregnancy 03/12/2020  . GBS bacteriuria 03/12/2020  . Pyelonephritis affecting pregnancy in third trimester 02/05/2020  . GBS (group b Streptococcus) UTI complicating pregnancy, second trimester 12/16/2019  . Supervision of other normal pregnancy, antepartum 08/20/2019  . Chlamydia trachomatis infection in pregnancy in second trimester 07/10/2017    Assessment/Plan:  Sherry Leach is a 25 y.o. G2P1001 at [redacted]w[redacted]d here for IOL secondary to Preeclampsia without severe  features.  #IOL: Given pt's initial cervical exam, FB was placed and dose of cytotec was administered. #Pain: TBD per pt request #FWB: Category 2 strip given intermittent variable decels; reassuringly, moderate variability with +accels #ID: GBS+ with PCN allergy; ancef started on admission per pharmacy #MOF: breast & bottle #MOC: depo #Circ: n/a #Preeclampsia: pt with multiple mild range blood pressures in clinic (as early as 03/04/20) and also at home (recorded on babyscripts). No severe range blood pressures to date. Currently asymptomatic. UP:C 0.39 on 03/17/20, up from baseline of 0.17. F/u preeclampsia labs on admission. #Rh negative: s/p rhogam in pregnancy; plan for rhogam workup s/p delivery  Goswick, Skipper Cliche, MD OB Fellow, [facility] 04/02/2020 1:20 AM

## 2020-04-02 NOTE — Progress Notes (Signed)
LABOR PROGRESS NOTE  Sherry Leach is a 25 y.o. G2P1001 at [redacted]w[redacted]d  admitted for IOL for PEC   Subjective: Patient doing well, reports feeling more pressure in bottom   Objective: BP 131/60   Pulse 79   Temp 98.2 F (36.8 C) (Oral)   Resp 16   Ht 5' 8.5" (1.74 m)   Wt 100.7 kg   LMP 06/17/2019 (Approximate)   SpO2 100%   BMI 33.26 kg/m  or  Vitals:   04/02/20 1200 04/02/20 1230 04/02/20 1300 04/02/20 1330  BP: 128/81 134/86 133/76 131/60  Pulse: 78 92 99 79  Resp: 14 16 14 16   Temp:  98.2 F (36.8 C)    TempSrc:  Oral    SpO2:      Weight:      Height:        Currently on 5milli-unit/min of pitocin. Patient SROM @1110  Dilation: 6 Effacement (%): 80 Cervical Position: Anterior Station: -1 Presentation: Vertex Exam by:: RN FHT: baseline rate 150, moderate varibility, +accel, variable decel Toco: 3-4   Labs: Lab Results  Component Value Date   WBC 11.7 (H) 04/02/2020   HGB 11.1 (L) 04/02/2020   HCT 35.3 (L) 04/02/2020   MCV 84.7 04/02/2020   PLT 185 04/02/2020    Patient Active Problem List   Diagnosis Date Noted  . Supervision of high risk pregnancy, antepartum 04/02/2020  . Preeclampsia 04/02/2020  . [redacted] weeks gestation of pregnancy 03/12/2020  . GBS bacteriuria 03/12/2020  . Pyelonephritis affecting pregnancy in third trimester 02/05/2020  . GBS (group b Streptococcus) UTI complicating pregnancy, second trimester 12/16/2019  . Supervision of other normal pregnancy, antepartum 08/20/2019  . Chlamydia trachomatis infection in pregnancy in second trimester 07/10/2017    Assessment / Plan: 25 y.o. G2P1001 at [redacted]w[redacted]d here for IOL for PEC   Labor: Progressing well on pitocin after SROM. Patient reports that last pregnancy she went from 4cm to 9cm in a short amount of time. Expect delivery soon if same course of labor as last time.  Fetal Wellbeing:  Cat II  Pain Control:  Epidural  Anticipated MOD:  SVD #PEC: BP stable at this time, continue to  monitor closely   24, CNM 04/02/2020, 1:41 PM

## 2020-04-02 NOTE — Progress Notes (Addendum)
LABOR PROGRESS NOTE  Sherry Leach is a 25 y.o. G2P1001 at [redacted]w[redacted]d  admitted for IOL for PEC   Subjective: Introduced self to patient. Patient doing well, comfortable with epidural at this time. Denies any questions or concerns   Objective: BP 136/82   Pulse 78   Temp 97.9 F (36.6 C) (Oral)   Resp 16   Ht 5' 8.5" (1.74 m)   Wt 100.7 kg   LMP 06/17/2019 (Approximate)   SpO2 100%   BMI 33.26 kg/m  or  Vitals:   04/02/20 0810 04/02/20 0815 04/02/20 0820 04/02/20 0830  BP: (!) 132/50 123/74 131/75 136/82  Pulse: 68 69 78 78  Resp: 14 16 14 16   Temp:      TempSrc:      SpO2: 99% 99% 98% 100%  Weight:      Height:        FB out @ 0837, cervix checked at that time  Dilation: 3 Effacement (%): 70 Cervical Position: Anterior Station: -2 Presentation: Vertex Exam by:: [email or web address] RN FHT: baseline rate 140, moderate varibility, +accel, variable decel Toco: 2-3  Labs: Lab Results  Component Value Date   WBC 11.7 (H) 04/02/2020   HGB 11.1 (L) 04/02/2020   HCT 35.3 (L) 04/02/2020   MCV 84.7 04/02/2020   PLT 185 04/02/2020    Patient Active Problem List   Diagnosis Date Noted  . Supervision of high risk pregnancy, antepartum 04/02/2020  . Preeclampsia 04/02/2020  . [redacted] weeks gestation of pregnancy 03/12/2020  . GBS bacteriuria 03/12/2020  . Pyelonephritis affecting pregnancy in third trimester 02/05/2020  . GBS (group b Streptococcus) UTI complicating pregnancy, second trimester 12/16/2019  . Supervision of other normal pregnancy, antepartum 08/20/2019  . Chlamydia trachomatis infection in pregnancy in second trimester 07/10/2017    Assessment / Plan: 25 y.o. G2P1001 at [redacted]w[redacted]d here for IOL   Labor: Patient currently on pitocin. Pit is on 46milli-unit/min. Educated and discussed with patient plan of care with continued on pitocin titration. Hopeful SROM but if no ROM or change of cervix over the course of 4 hours then will discuss r/b of AROM at that time. Patient  verbalizes understanding and agrees to plan of care  Fetal Wellbeing:  Cat II  Pain Control:  Epidural  Anticipated MOD:  SVD #PEC: BP continues to remain stable, patient denies HA or vision changes at this time   3m 04/02/2020, 9:55 AM

## 2020-04-02 NOTE — Anesthesia Procedure Notes (Signed)
Epidural Patient location during procedure: OB Start time: 04/02/2020 7:36 AM End time: 04/02/2020 7:50 AM  Staffing Anesthesiologist: Trevor Iha, MD Performed: anesthesiologist   Preanesthetic Checklist Completed: patient identified, IV checked, site marked, risks and benefits discussed, surgical consent, monitors and equipment checked, pre-op evaluation and timeout performed  Epidural Patient position: sitting Prep: DuraPrep and site prepped and draped Patient monitoring: continuous pulse ox and blood pressure Approach: midline Location: L3-L4 Injection technique: LOR air  Needle:  Needle type: Tuohy  Needle gauge: 17 G Needle length: 9 cm and 9 Needle insertion depth: 5 cm cm Catheter type: closed end flexible Catheter size: 19 Gauge Catheter at skin depth: 10 cm Test dose: negative  Assessment Events: blood not aspirated, injection not painful, no injection resistance, no paresthesia and negative IV test  Additional Notes Patient identified. Risks/Benefits/Options discussed with patient including but not limited to bleeding, infection, nerve damage, paralysis, failed block, incomplete pain control, headache, blood pressure changes, nausea, vomiting, reactions to medication both or allergic, itching and postpartum back pain. Confirmed with bedside nurse the patient's most recent platelet count. Confirmed with patient that they are not currently taking any anticoagulation, have any bleeding history or any family history of bleeding disorders. Patient expressed understanding and wished to proceed. All questions were answered. Sterile technique was used throughout the entire procedure. Please see nursing notes for vital signs. Test dose was given through epidural needle and negative prior to continuing to dose epidural or start infusion. Warning signs of high block given to the patient including shortness of breath, tingling/numbness in hands, complete motor block, or any  concerning symptoms with instructions to call for help. Patient was given instructions on fall risk and not to get out of bed. All questions and concerns addressed with instructions to call with any issues. 1 Attempt (S) . Patient tolerated procedure well.

## 2020-04-02 NOTE — Anesthesia Preprocedure Evaluation (Signed)
Anesthesia Evaluation  Patient identified by MRN, date of birth, ID band Patient awake    Reviewed: Allergy & Precautions, NPO status , Patient's Chart, lab work & pertinent test results  Airway Mallampati: II  TM Distance: >3 FB Neck ROM: Full    Dental no notable dental hx. (+) Teeth Intact, Dental Advisory Given   Pulmonary Patient abstained from smoking., former smoker,    Pulmonary exam normal breath sounds clear to auscultation       Cardiovascular hypertension, Normal cardiovascular exam Rhythm:Regular Rate:Normal  PRE  E   Neuro/Psych    GI/Hepatic negative GI ROS, Neg liver ROS,   Endo/Other  negative endocrine ROS  Renal/GU negative Renal ROS     Musculoskeletal   Abdominal   Peds  Hematology Lab Results      Component                Value               Date                      WBC                      11.7 (H)            04/02/2020                HGB                      11.1 (L)            04/02/2020                HCT                      35.3 (L)            04/02/2020                MCV                      84.7                04/02/2020                PLT                      185                 04/02/2020              Anesthesia Other Findings   Reproductive/Obstetrics (+) Pregnancy                            Anesthesia Physical Anesthesia Plan  ASA: III  Anesthesia Plan: Epidural   Post-op Pain Management:    Induction:   PONV Risk Score and Plan:   Airway Management Planned:   Additional Equipment:   Intra-op Plan:   Post-operative Plan:   Informed Consent: I have reviewed the patients History and Physical, chart, labs and discussed the procedure including the risks, benefits and alternatives for the proposed anesthesia with the patient or authorized representative who has indicated his/her understanding and acceptance.     Dental advisory given  Plan  Discussed with:   Anesthesia Plan Comments: (37wk G2P1 w Pre E for LEA)  Anesthesia Quick Evaluation  

## 2020-04-02 NOTE — Discharge Summary (Addendum)
Postpartum Discharge Summary     Patient Name: Sherry Leach DOB: [birth year] MRN: [id number]  Date of admission: 04/01/2020 Delivery date:04/02/2020  Delivering provider: Lajean Manes  Date of discharge: 04/03/2020  Admitting diagnosis: Supervision of high risk pregnancy, antepartum [O09.90] Intrauterine pregnancy: [redacted]w[redacted]d    Secondary diagnosis:  Active Problems:   GBS bacteriuria   Supervision of high risk pregnancy, antepartum   Preeclampsia   SVD (spontaneous vaginal delivery)   Rh negative status during pregnancy  Additional problems: Chlamydia infection in pregnancy in second trimester, pyelonephritis affecting pregnancy in third trimester      Discharge diagnosis: Term Pregnancy Delivered and Preeclampsia (mild)                                              Post partum procedures:none Augmentation: Pitocin, Cytotec and IP Foley Complications: None  Hospital course: Induction of Labor With Vaginal Delivery   25y.o. yo G2P1001 at 311w0das admitted to the hospital 04/01/2020 for induction of labor.  Indication for induction: Preeclampsia.  Patient had an uncomplicated labor course as follows: Membrane Rupture Time/Date: 11:10 AM ,04/02/2020   Delivery Method:Vaginal, Spontaneous  Episiotomy: None  Lacerations:  None  Details of delivery can be found in separate delivery note.  Patient had a routine postpartum course. Patient is discharged home 04/03/20.  Newborn Data: Birth date:04/02/2020  Birth time:2:02 PM  Gender:Female  Living status:Living  Apgars:9 ,9  Weight:2860 g   Magnesium Sulfate received: No BMZ received: No Rhophylac: Given MMR:N/A T-DaP:declined Flu: declined  Transfusion:No  Physical exam  Vitals:   04/02/20 1723 04/02/20 2115 04/03/20 0100 04/03/20 0500  BP: 131/79 137/68 122/72 123/70  Pulse: 83 90 88 76  Resp: 18 18 16 16   Temp: 99.1 F (37.3 C) 98 F (36.7 C) 98.1 F (36.7 C) 98.1 F (36.7 C)  TempSrc: Oral Oral Oral Oral  SpO2: 100%  99% 100% 99%  Weight:      Height:       General: alert, cooperative and no distress Lochia: appropriate Uterine Fundus: firm Incision: N/A DVT Evaluation: No evidence of DVT seen on physical exam. Labs: Lab Results  Component Value Date   WBC 14.0 (H) 04/02/2020   HGB 10.1 (L) 04/02/2020   HCT 32.7 (L) 04/02/2020   MCV 84.9 04/02/2020   PLT 165 04/02/2020   CMP Latest Ref Rng & Units 04/02/2020  Glucose 70 - 99 mg/dL 98  BUN 6 - 20 mg/dL <5(L)  Creatinine 0.44 - 1.00 mg/dL 0.62  Sodium 135 - 145 mmol/L 134(L)  Potassium 3.5 - 5.1 mmol/L 4.3  Chloride 98 - 111 mmol/L 106  CO2 22 - 32 mmol/L 18(L)  Calcium 8.9 - 10.3 mg/dL 9.4  Total Protein 6.5 - 8.1 g/dL 7.0  Total Bilirubin 0.3 - 1.2 mg/dL 0.6  Alkaline Phos 38 - 126 U/L 91  AST 15 - 41 U/L 25  ALT 0 - 44 U/L 16   Edinburgh Score: Edinburgh Postnatal Depression Scale Screening Tool 10/23/2017  I have been able to laugh and see the funny side of things. 0  I have looked forward with enjoyment to things. 0  I have blamed myself unnecessarily when things went wrong. 0  I have been anxious or worried for no good reason. 1  I have felt scared or panicky for no good reason.  0  Things have been getting on top of me. 0  I have been so unhappy that I have had difficulty sleeping. 0  I have felt sad or miserable. 0  I have been so unhappy that I have been crying. 0  The thought of harming myself has occurred to me. 0  Edinburgh Postnatal Depression Scale Total 1     After visit meds:  Allergies as of 04/03/2020      Reactions   Amoxicillin Rash   Has patient had a PCN reaction causing immediate rash, facial/tongue/throat swelling, SOB or lightheadedness with hypotension: No Has patient had a PCN reaction causing severe rash involving mucus membranes or skin necrosis: No Has patient had a PCN reaction that required hospitalization: No Has patient had a PCN reaction occurring within the last 10 years: Yes If all of the above  answers are "NO", then may proceed with Cephalosporin use.   Penicillins Rash   Has patient had a PCN reaction causing immediate rash, facial/tongue/throat swelling, SOB or lightheadedness with hypotension: No Has patient had a PCN reaction causing severe rash involving mucus membranes or skin necrosis: No Has patient had a PCN reaction that required hospitalization: No Has patient had a PCN reaction occurring within the last 10 years: Yes If all of the above answers are "NO", then may proceed with Cephalosporin use.      Medication List    TAKE these medications   acetaminophen 325 MG tablet Commonly known as: Tylenol Take 2 tablets (650 mg total) by mouth every 6 (six) hours. What changed:   medication strength  how much to take  when to take this  reasons to take this   cefadroxil 500 MG capsule Commonly known as: DURICEF Take 1 capsule (500 mg total) by mouth daily. For suppression until after delivery.   cyclobenzaprine 10 MG tablet Commonly known as: FLEXERIL Take 10 mg by mouth 3 (three) times daily as needed for muscle spasms.   ferrous sulfate 325 (65 FE) MG tablet Take 1 tablet (325 mg total) by mouth every other day.   ibuprofen 600 MG tablet Commonly known as: ADVIL Take 1 tablet (600 mg total) by mouth every 6 (six) hours.   prenatal multivitamin Tabs tablet Take 1 tablet by mouth daily at 12 noon.      Discharge home in stable condition Infant Feeding: Breast Infant Disposition:home with mother Discharge instruction: per After Visit Summary and Postpartum booklet. Activity: Advance as tolerated. Pelvic rest for 6 weeks.  Diet: routine diet Future Appointments: Future Appointments  Date Time Provider [place]  04/09/2020 10:40 AM [place] None  04/30/2020 11:15 AM Gavin Pound, CNM [facility] None   Follow up Visit:   Please schedule this patient for a In person postpartum visit in 4 weeks with the following provider: Any  provider. Additional Postpartum F/U:BP check 1 week  High risk pregnancy complicated by: HTN Delivery mode:  Vaginal, Spontaneous  Anticipated Birth Control:  PP Depo given  Ian Bushman, MD   I saw and evaluated the patient. I agree with the findings and the plan of care as documented in the resident's note.  Sharene Skeans, MD [facility] Family Medicine Fellow, [facility] for [facility], [place]

## 2020-04-02 NOTE — MAU Note (Signed)
Pt reports ctx's that started 3 hours ago.   Pt reports they are 3-4 minutes apart.   Reports +FM   Denies vaginal bleeding or LOF.

## 2020-04-03 MED ORDER — IBUPROFEN 600 MG PO TABS: 600.0000 mg | ORAL_TABLET | Freq: Four times a day (QID) | ORAL | 0 refills | Status: DC

## 2020-04-03 MED ORDER — ACETAMINOPHEN 325 MG PO TABS: 650.0000 mg | ORAL_TABLET | Freq: Four times a day (QID) | ORAL | Status: DC

## 2020-04-03 NOTE — Discharge Instructions (Signed)

## 2020-04-03 NOTE — Anesthesia Postprocedure Evaluation (Signed)
Anesthesia Post Note  Patient: Sherry Leach  Procedure(s) Performed: AN AD HOC LABOR EPIDURAL     Patient location during evaluation: Mother Baby Anesthesia Type: Epidural Level of consciousness: awake and alert Pain management: pain level controlled Vital Signs Assessment: post-procedure vital signs reviewed and stable Respiratory status: spontaneous breathing, nonlabored ventilation and respiratory function stable Cardiovascular status: stable Postop Assessment: no headache, no backache and epidural receding Anesthetic complications: no   No complications documented.  Last Vitals:  Vitals:   04/03/20 0100 04/03/20 0500  BP: 122/72 123/70  Pulse: 88 76  Resp: 16 16  Temp: 36.7 C 36.7 C  SpO2: 100% 99%    Last Pain:  Vitals:   04/03/20 0500  TempSrc: Oral  PainSc: 0-No pain   Pain Goal: Patients Stated Pain Goal: 4 (04/02/20 1230)                 [facility] Records

## 2020-04-06 ENCOUNTER — Telehealth: Payer: Self-pay

## 2020-04-06 NOTE — Telephone Encounter (Signed)
Transition Care Management Unsuccessful Follow-up Telephone Call  Date of discharge and from where:  04/03/2020 from [facility].   Attempts:  1st Attempt  Reason for unsuccessful TCM follow-up call:  Left voice message

## 2020-04-06 NOTE — Telephone Encounter (Signed)
Transition Care Management Follow-up Telephone Call  Date of discharge and from where: 04/03/2020 from [facility].   How have you been since you were released from the hospital? Pt states that everything is going well and she has no questions or concerns at this time.   Any questions or concerns? No  Items Reviewed:  Did the pt receive and understand the discharge instructions provided? Yes   Medications obtained and verified? Yes   Other? No   Any new allergies since your discharge? No   Dietary orders reviewed? n/a  Do you have support at home? Yes    Functional Questionnaire: (I = Independent and D = Dependent) ADLs: I  Bathing/Dressing- I  Meal Prep- I  Eating- I  Maintaining continence- I  Transferring/Ambulation- I  Managing Meds- I   Follow up appointments reviewed:   PCP Hospital f/u appt confirmed? No   Specialist Hospital f/u appt confirmed? Yes  Scheduled to see Gerrit Heck, CNM on 04/30/2020 @ 11:15am  Are transportation arrangements needed? No   If their condition worsens, is the pt aware to call PCP or go to the Emergency Dept.? Yes  Was the patient provided with contact information for the PCP's office or ED? Yes  Was to pt encouraged to call back with questions or concerns? Yes

## 2020-04-09 ENCOUNTER — Encounter: Payer: Medicaid Other | Admitting: Certified Nurse Midwife

## 2020-04-09 ENCOUNTER — Other Ambulatory Visit: Payer: Self-pay

## 2020-04-09 ENCOUNTER — Ambulatory Visit (INDEPENDENT_AMBULATORY_CARE_PROVIDER_SITE_OTHER): Payer: Medicaid Other

## 2020-04-09 VITALS — BP 128/82 | HR 105 | Wt 210.0 lb

## 2020-04-09 DIAGNOSIS — Z0130 Encounter for examination of blood pressure without abnormal findings: Secondary | ICD-10-CM | POA: Diagnosis not present

## 2020-04-09 NOTE — Progress Notes (Addendum)
Subjective:  Sherry Leach is a 25 y.o. female 1 Week PP, here for BP check.   Hypertension ROS: no TIA's, no chest pain on exertion, no dyspnea on exertion and no swelling of ankles.    Objective:  BP 128/82 (BP Location: Left Arm, Cuff Size: Large)   Pulse (!) 105   Wt 210 lb (95.3 kg)   LMP 06/17/2019 (Approximate)   Breastfeeding No   BMI 31.47 kg/m   Appearance alert, well appearing, and in no distress. General exam BP noted to be well controlled today in office.    Assessment:   Blood Pressure well controlled.   Plan:  Current treatment plan is effective, no change in therapy. per Dr. Clearance Coots. Keep PP appt.  Patient was assessed and managed by nursing staff during this encounter. I have reviewed the chart and agree with the documentation and plan. I have also made any necessary editorial changes.  Coral Ceo, MD 04/09/2020 12:09 PM

## 2020-04-30 ENCOUNTER — Ambulatory Visit (INDEPENDENT_AMBULATORY_CARE_PROVIDER_SITE_OTHER): Payer: Medicaid Other

## 2020-04-30 ENCOUNTER — Other Ambulatory Visit: Payer: Self-pay

## 2020-04-30 DIAGNOSIS — Z30013 Encounter for initial prescription of injectable contraceptive: Secondary | ICD-10-CM | POA: Diagnosis not present

## 2020-04-30 LAB — POCT URINE PREGNANCY: Preg Test, Ur: NEGATIVE

## 2020-04-30 MED ORDER — MEDROXYPROGESTERONE ACETATE 150 MG/ML IM SUSP: 150.0000 mg | INTRAMUSCULAR | 3 refills | Status: DC

## 2020-04-30 MED ORDER — MEDROXYPROGESTERONE ACETATE 150 MG/ML IM SUSP
150.0000 mg | Freq: Once | INTRAMUSCULAR | Status: AC
Start: 2020-04-30 — End: 2020-04-30
  Administered 2020-04-30: 150 mg via INTRAMUSCULAR

## 2020-04-30 NOTE — Progress Notes (Signed)
Post Partum Visit Note  Sherry Leach is a 25 y.o. G73P2002 female who presents for a postpartum visit. She is 4 weeks postpartum following a normal spontaneous vaginal delivery.  I have fully reviewed the prenatal and intrapartum course. The delivery was at 37 gestational weeks.  Anesthesia: epidural. Postpartum course has been unremarkable. Baby is doing well. Baby is feeding by BOTH / Pumping . Bleeding staining only. Bowel function is normal. Bladder function is normal. Patient is not sexually active. Contraception method is none. Postpartum depression screening: negative.  Patient denies problems with her breast and states her milk wouldn't dry up so she is hand expressing and feeding baby. She reports she is will get a hand pump today. She reports her bleeding is "moderate."   She expresses a desire for depo provera and has had it in the past with bleeding, but "kept me from having."   The pregnancy intention screening data noted above was reviewed. Potential methods of contraception were discussed. The patient elected to proceed with Hormonal Injection.    Edinburgh Postnatal Depression Scale - 04/30/20 1122      Edinburgh Postnatal Depression Scale:  In the Past 7 Days   I have been able to laugh and see the funny side of things. 0    I have looked forward with enjoyment to things. 0    I have blamed myself unnecessarily when things went wrong. 0    I have been anxious or worried for no good reason. 0    I have felt scared or panicky for no good reason. 0    Things have been getting on top of me. 0    I have been so unhappy that I have had difficulty sleeping. 0    I have felt sad or miserable. 0    I have been so unhappy that I have been crying. 0    The thought of harming myself has occurred to me. 0    Edinburgh Postnatal Depression Scale Total 0            The following portions of the patient's history were reviewed and updated as appropriate: allergies, current  medications, past family history, past medical history, past social history, past surgical history and problem list.  Review of Systems Pertinent items are noted in HPI.    Objective:  BP 125/85   Pulse 99   Wt 211 lb (95.7 kg)   LMP 06/17/2019 (Approximate)   BMI 31.62 kg/m    General:  alert, cooperative and no distress   Breasts:  Not examined  Lungs: clear to auscultation bilaterally  Heart:  regular rate and rhythm  Abdomen: normal findings: soft, non-tender   Vulva:  not evaluated  Vagina: not evaluated  Cervix:  Not Evaluated  Corpus: not examined  Adnexa:  not evaluated  Rectal Exam: Not performed.        Assessment:   4 weeks postpartum exam. Pap smear UTD Expressing Milk Desires Depo Provera  Plan:    -Patient okay to return to work after 6 weeks. -Instructed to send in or bring up forms for completion.  -Encouraged to continue expressing milk. -Reviewed techniques for weaning, when ready. -Educated on risks of abnormal bleeding with depo usage.  Encouraged to allow for at least 3 injections before pattern can be distinguished.  Essential components of care per ACOG recommendations:  1.  Mood and well being: Patient with negative depression screening today. Reviewed local resources for support.  -  Patient does not use tobacco. - hx of drug use? No   2. Infant care and feeding:  -Patient currently breastmilk feeding? No If breastmilk feeding discussed return to work and pumping. If needed, patient was provided letter for work to allow for every 2-3 hr pumping breaks, and to be granted a private location to express breastmilk and refrigerated area to store breastmilk. Reviewed importance of draining breast regularly to support lactation. -Social determinants of health (SDOH) reviewed in EPIC. No concerns  3. Sexuality, contraception and birth spacing - Patient does not want a pregnancy in the next year.  Desired family size is unsure children.  - Reviewed  forms of contraception in tiered fashion. Patient desired Depo-Provera today.   - Discussed birth spacing of 18 months  4. Sleep and fatigue -Encouraged family/partner/community support of 4 hrs of uninterrupted sleep to help with mood and fatigue  5. Physical Recovery  - Discussed patients delivery and complications-"really easy, she came out in 3 pushes." - Patient had no lacerations - Patient has urinary incontinence? Yes, but states it is not as bad as it was in pregnancy.  Patient declines PT referral.  - Patient is safe to resume physical and sexual activity  6.  Health Maintenance - Last pap smear done 10/01/2019 and was normal with negative HPV. No Mammogram  7. No Chronic Disease - PCP follow up as needed  Cherre Robins, CNM Center for [employer], [facility]

## 2020-05-20 ENCOUNTER — Telehealth: Payer: Self-pay | Admitting: *Deleted

## 2020-05-20 DIAGNOSIS — N921 Excessive and frequent menstruation with irregular cycle: Secondary | ICD-10-CM

## 2020-05-20 NOTE — Telephone Encounter (Signed)
Pt called to office to discuss bleeding. States that provider stated she could get Rx if her bleeding continued or worsened.   Pt states she was given Depo at her PP visit a few weeks ago.  Since then pt has started cycle and has been bleeding x 3 weeks.  Pt states heavy flow and going through pads every couple hours.  Pt would like to know if provider can send in a Rx to help with bleeding.    Pt made aware message to be sent to provider for follow up and recommendations/Rx.  Please review and send Rx if needed.  Rx can go to [facility].

## 2020-05-22 IMAGING — US US MFM OB FOLLOW-UP
1 series · 13 of 28 positions shown · non-contrast
Comparison: none

[Series 1: us mfm ob follow-up · 47 acquisitions, 13 frames shown]
[im 2/47]
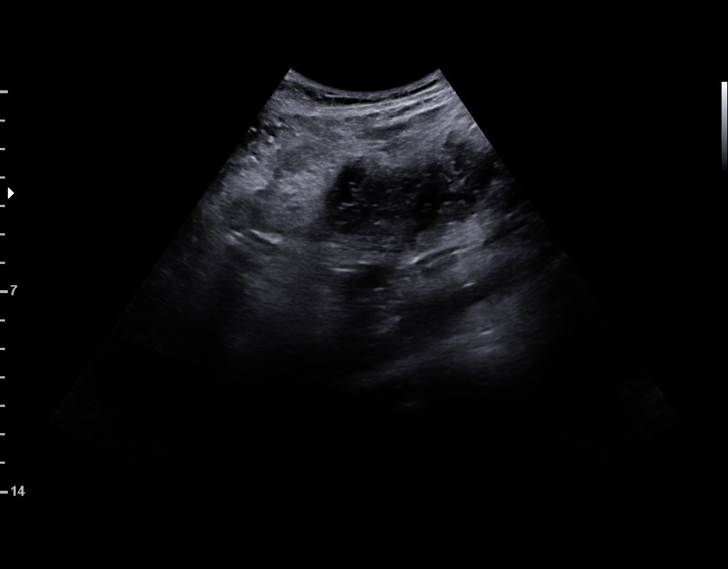
[im 6/47]
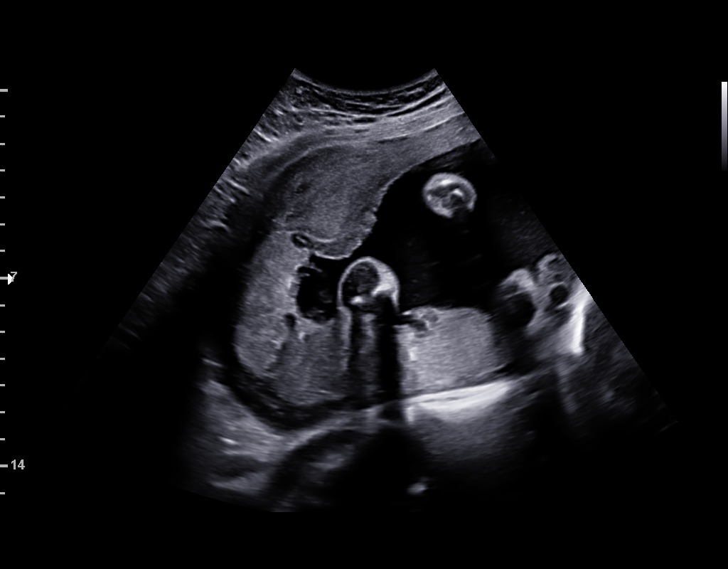
[im 9/47]
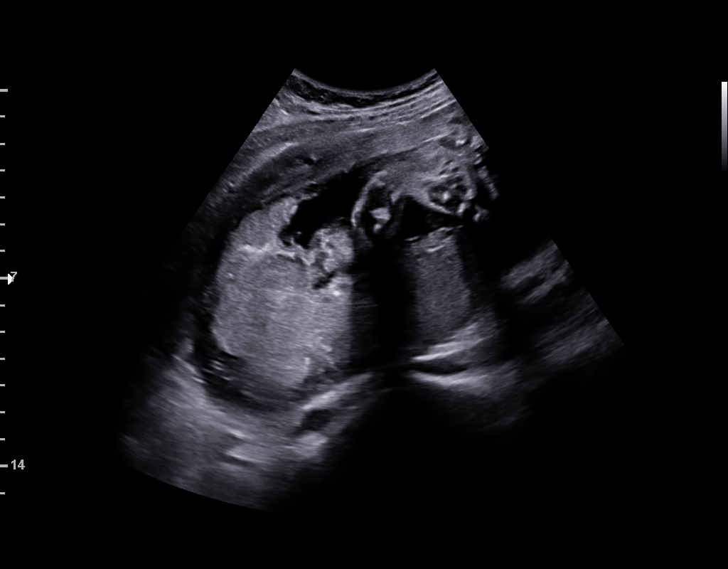
[im 12/47]
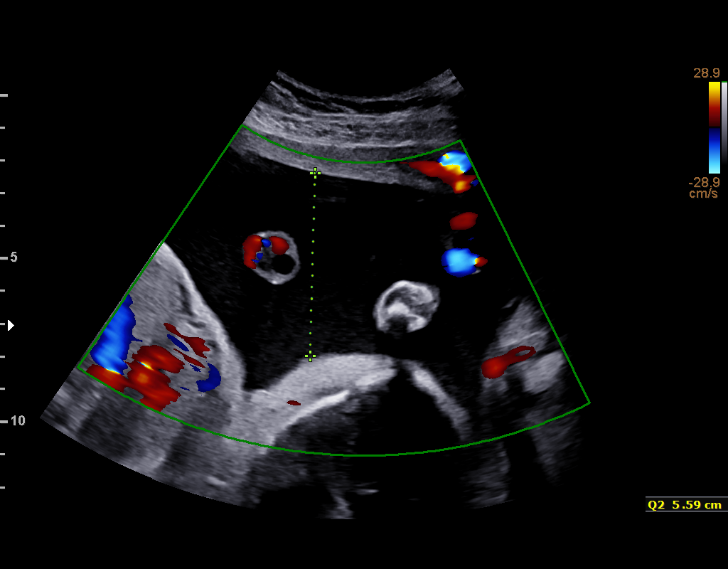
[im 16/47]
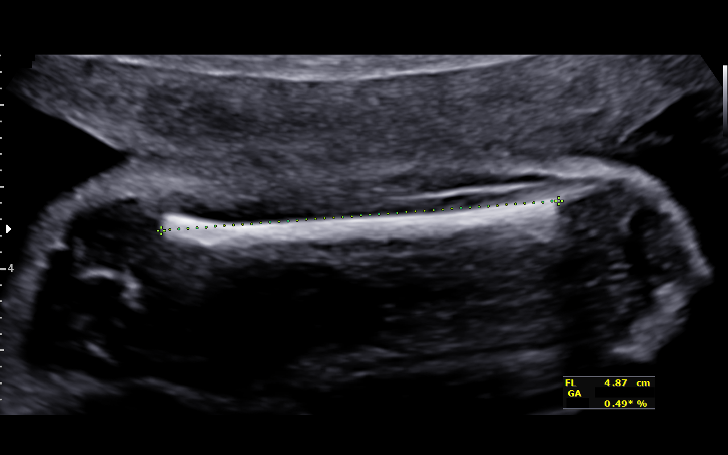
[im 19/47]
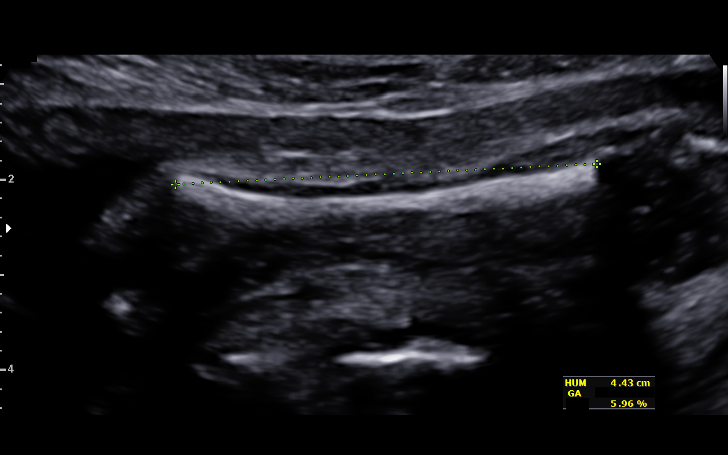
[im 24/47]
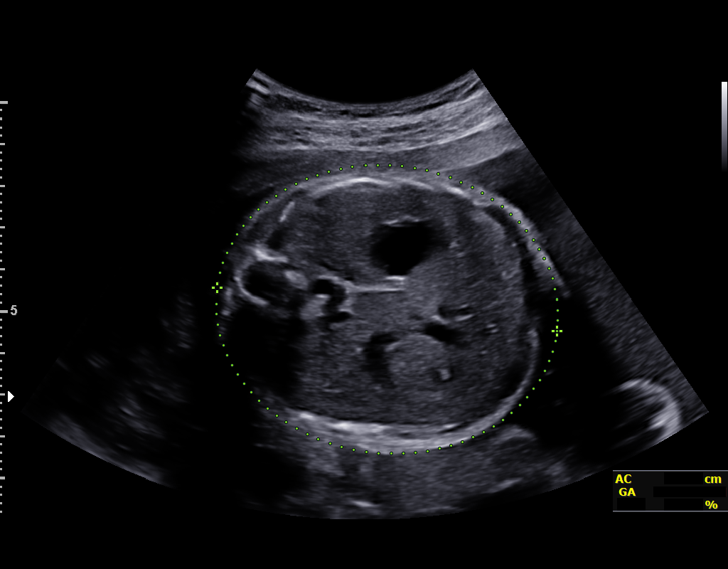
[im 28/47]
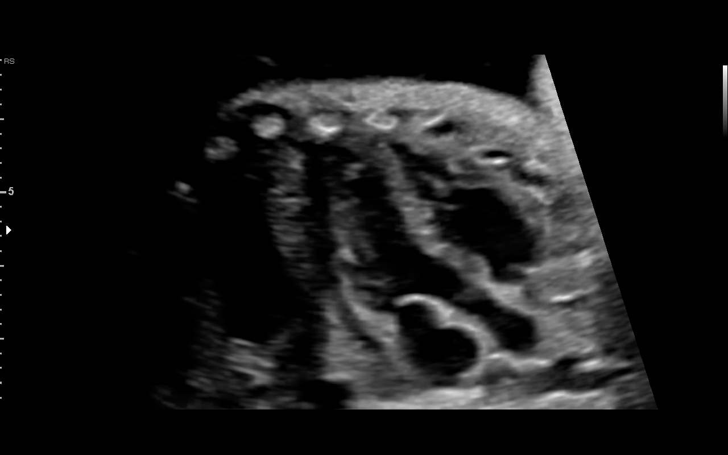
[im 31/47]
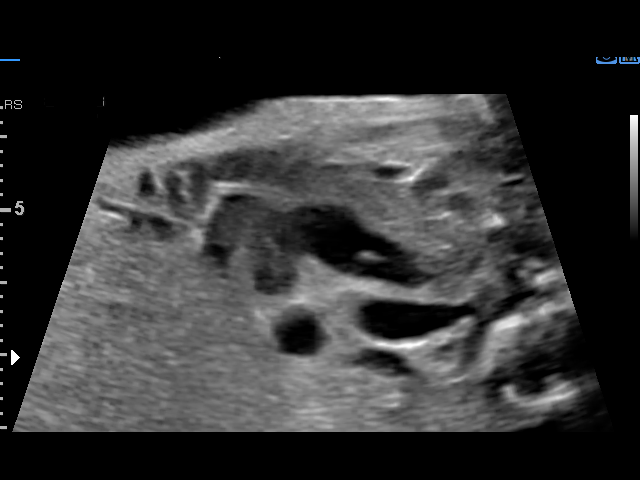
[im 35/47]
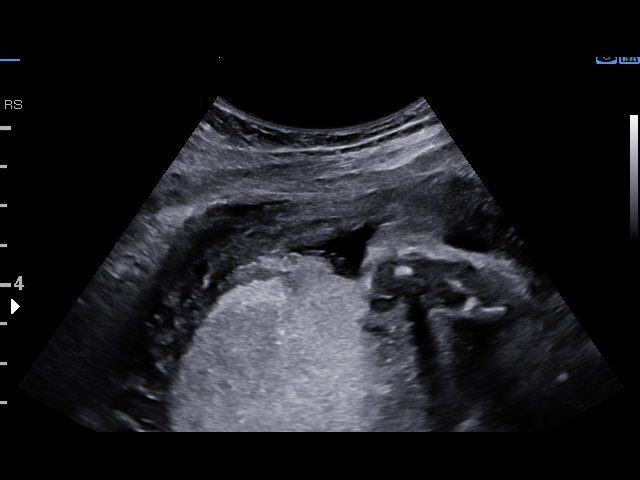
[im 38/47]
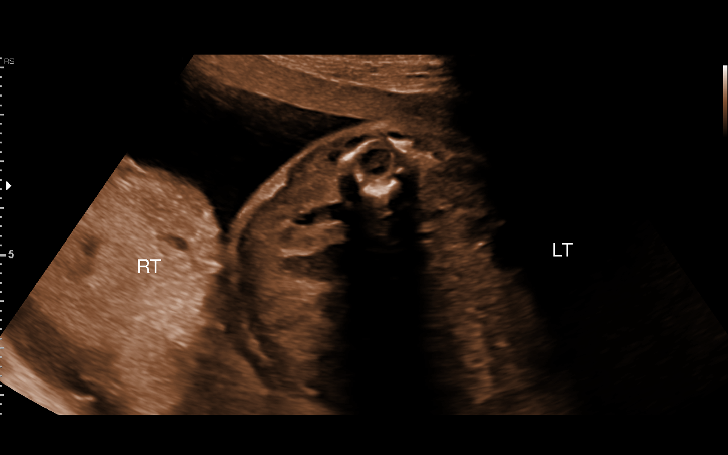
[im 41/47]
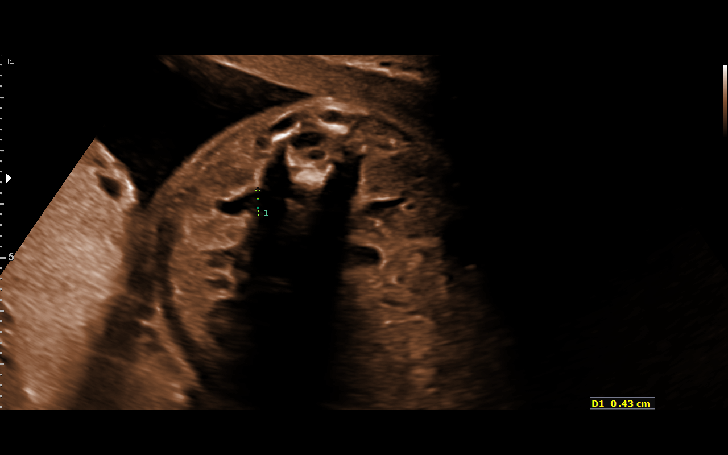
[im 45/47]
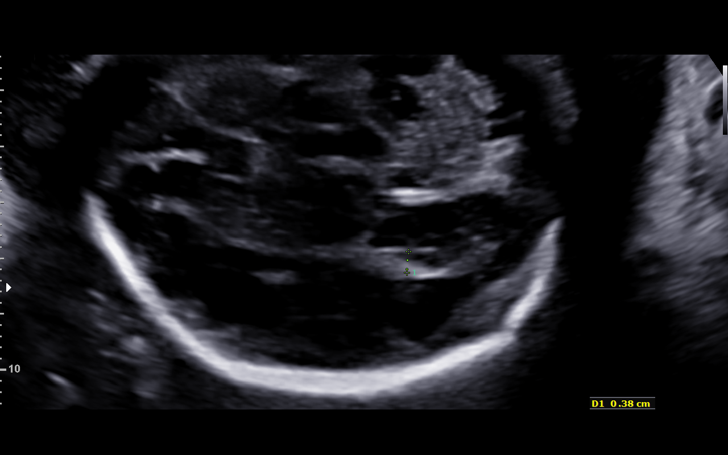

[13 of 28 positions shown; findings below may reference images not displayed]

[REDACTED]. [HOSPITAL],

1  KLASIENA PESCOD              [id number]      [id number]     [id number]
Indications

29 weeks gestation of pregnancy
Encounter for other antenatal screening
follow-up
Fetal abnormality - other known or
suspected (right renal pyelectasis)
OB History

Blood Type:            Height:  5'8"   Weight (lb):  135       BMI:
Gravidity:    1
Fetal Evaluation

Num Of Fetuses:     1
Fetal Heart         145
Rate(bpm):
Cardiac Activity:   Observed
Presentation:       Cephalic
Placenta:           Posterior, above cervical os
P. Cord Insertion:  Previously Visualized

Amniotic Fluid
AFI FV:      Subjectively within normal limits

AFI Sum(cm)     %Tile       Largest Pocket(cm)
19.94           78

RUQ(cm)       RLQ(cm)       LUQ(cm)        LLQ(cm)
3.01
Biometry
BPD:      73.3  mm     G. Age:  29w 3d         46  %    CI:        70.33   %    70 - 86
FL/HC:      17.6   %    19.6 -
HC:      278.7  mm     G. Age:  30w 3d         57  %    HC/AC:      1.16        0.99 -
AC:       240   mm     G. Age:  28w 2d         21  %    FL/BPD:     66.8   %    71 - 87
FL:         49  mm     G. Age:  26w 4d        < 3  %    FL/AC:      20.4   %    20 - 24
HUM:        44  mm     G. Age:  26w 1d        < 5  %

Est. FW:    5592  gm      2 lb 9 oz     28  %
Gestational Age

LMP:           29w 1d        Date:  12/01/16                 EDD:   09/07/17
U/S Today:     28w 5d                                        EDD:   09/10/17
Best:          29w 1d     Det. By:  LMP  (12/01/16)          EDD:   09/07/17
Anatomy

Cranium:               Appears normal         Aortic Arch:            Previously seen
Cavum:                 Previously seen        Ductal Arch:            Previously seen
Ventricles:            Appears normal         Diaphragm:              Previously seen
Choroid Plexus:        Previously seen        Stomach:                Appears normal, left
sided
Cerebellum:            Previously seen        Abdomen:                Appears normal
Posterior Fossa:       Previously seen        Abdominal Wall:         Previously seen
Nuchal Fold:           Previously seen        Cord Vessels:           Previously seen
Face:                  Orbits and profile     Kidneys:                Appear normal
previously seen
Lips:                  Previously seen        Bladder:                Appears normal
Thoracic:              Appears normal         Spine:                  Previously seen
Heart:                 Previously seen        Upper Extremities:      Previously seen
RVOT:                  Previously seen        Lower Extremities:      Previously seen
LVOT:                  Appears normal

Other:  Female gender seen. Heels previously seen. 5th digit prev visualized.
Nasal bone prev visualized.
Cervix Uterus Adnexa

Cervix
Not visualized (advanced GA >72wks)

Uterus
No abnormality visualized.

Left Ovary
Not visualized.

Right Ovary
Within normal limits.

Adnexa:       No abnormality visualized. No adnexal mass
visualized.
Impression

Single living intrauterine pregnancy at 29w 1d.
Cephalic presentation.
Placenta Posterior, above cervical os.
Normal amniotic fluid volume.
Appropriate interval fetal growth overall though the femur
lengths are below average again (but appear to demonstrate
interval growth that approximates similar percentile to last 2
evaluations)
bones notably have normal echogenicity/ossification, normal
contour (no bowing) and no evidence of fractures
Normal interval fetal anatomy.
Note previously seen UTD has resolved
Recommendations

Recommend follow-up ultrasound examination in 6 weeks to
plot biometry and overall fetal growth pattern.

## 2020-05-27 MED ORDER — NORETHIN ACE-ETH ESTRAD-FE 1-20 MG-MCG PO TABS: 1.0000 | ORAL_TABLET | Freq: Every day | ORAL | 1 refills | Status: DC

## 2020-05-27 NOTE — Telephone Encounter (Signed)
Rx for COCs sent to pharmacy on file.

## 2020-09-09 ENCOUNTER — Ambulatory Visit: Payer: Medicaid Other

## 2020-09-23 ENCOUNTER — Other Ambulatory Visit: Payer: Self-pay

## 2020-09-23 ENCOUNTER — Other Ambulatory Visit (HOSPITAL_COMMUNITY)
Admission: RE | Admit: 2020-09-23 | Discharge: 2020-09-23 | Disposition: A | Discharge status: Home / Self Care | Payer: Medicaid Other | Source: Ambulatory Visit | Attending: Obstetrics & Gynecology | Admitting: Obstetrics & Gynecology

## 2020-09-23 ENCOUNTER — Ambulatory Visit (INDEPENDENT_AMBULATORY_CARE_PROVIDER_SITE_OTHER): Payer: Medicaid Other

## 2020-09-23 DIAGNOSIS — Z3042 Encounter for surveillance of injectable contraceptive: Secondary | ICD-10-CM | POA: Diagnosis not present

## 2020-09-23 DIAGNOSIS — Z113 Encounter for screening for infections with a predominantly sexual mode of transmission: Secondary | ICD-10-CM | POA: Insufficient documentation

## 2020-09-23 DIAGNOSIS — A749 Chlamydial infection, unspecified: Secondary | ICD-10-CM

## 2020-09-23 LAB — POCT URINE PREGNANCY: Preg Test, Ur: NEGATIVE

## 2020-09-23 NOTE — Progress Notes (Signed)
Sherry Leach presents today for 1st UPT - Depo restart and vaginal STD screening. Pt declined STD bloodwork.     OBJECTIVE: Appears well, in no apparent distress.  OB History     Gravida  2   Para  2   Term  2   Preterm      AB      Living  2      SAB      IAB      Ectopic      Multiple  0   Live Births  2        Obstetric Comments  2 D&C's for retained products w/ first pregnancy        In-Office UPT result: negative   I have reviewed the patient's medical, obstetrical, social, and family histories, and medications.   ASSESSMENT: Negative pregnancy test  PLAN 2nd UPT due in two weeks Bring Depo to next visit  GC, chlamydia, trichomonas, sent to lab. Treatment: To be determined once lab results are reviewed by the provider

## 2020-09-24 LAB — CERVICOVAGINAL ANCILLARY ONLY
Chlamydia: POSITIVE — AB
Comment: NEGATIVE
Comment: NEGATIVE
Comment: NORMAL
Neisseria Gonorrhea: NEGATIVE
Trichomonas: NEGATIVE

## 2020-09-28 MED ORDER — DOXYCYCLINE HYCLATE 100 MG PO CAPS: 100.0000 mg | ORAL_CAPSULE | Freq: Two times a day (BID) | ORAL | 0 refills | Status: DC

## 2020-09-28 NOTE — Progress Notes (Addendum)
Patient notified of positive Chlamydia results. Informed of RX for Doxycycline sent in and of need for partner to be notified and of need for abstinence until treatment is completed. Instructed on need for test of cure 3 months after treatment. Patient verbalized understanding.

## 2020-09-28 NOTE — Addendum Note (Signed)
Addended by: Harrel Lemon on: 09/28/2020 11:13 AM   Modules accepted: Orders

## 2020-09-29 ENCOUNTER — Encounter: Payer: Self-pay | Admitting: *Deleted

## 2020-09-29 MED ORDER — AZITHROMYCIN 500 MG PO TABS: 1000.0000 mg | ORAL_TABLET | Freq: Once | ORAL | 1 refills | Status: AC

## 2020-09-29 NOTE — Addendum Note (Signed)
Addended by: Adam Phenix on: 09/29/2020 03:24 PM   Modules accepted: Orders

## 2020-10-07 ENCOUNTER — Other Ambulatory Visit: Payer: Self-pay

## 2020-10-07 ENCOUNTER — Ambulatory Visit (INDEPENDENT_AMBULATORY_CARE_PROVIDER_SITE_OTHER): Payer: Medicaid Other

## 2020-10-07 VITALS — BP 120/82 | HR 76 | Ht 68.0 in | Wt 218.0 lb

## 2020-10-07 DIAGNOSIS — Z3042 Encounter for surveillance of injectable contraceptive: Secondary | ICD-10-CM

## 2020-10-07 LAB — POCT URINE PREGNANCY: Preg Test, Ur: NEGATIVE

## 2020-10-07 MED ORDER — MEDROXYPROGESTERONE ACETATE 150 MG/ML IM SUSP
150.0000 mg | Freq: Once | INTRAMUSCULAR | Status: AC
  Administered 2020-10-07: 150 mg via INTRAMUSCULAR

## 2020-10-07 NOTE — Progress Notes (Signed)
SUBJECTIVE: Sherry Leach is a 25 y.o. female who presents for 2nd UPT and DEPO Injection restart.   OBJECTIVE: Appears well, in no apparent distress.  Vital signs are normal.    ASSESSMENT: Patient desires BC.  UPT today is NEGATIVE.  Needs Annual Exam.  Last PAP 10/01/2019  PLAN: DEPO given in RD, tolerated well.  Next DEPO due 11/23-12/07/2020  Make appt for Annual Exam.  Administrations This Visit     medroxyPROGESTERone (DEPO-PROVERA) injection 150 mg     Admin Date 10/07/2020 Action Given Dose 150 mg Route Intramuscular Administered By Maretta Bees, RMA

## 2020-10-07 NOTE — Progress Notes (Signed)
Patient was assessed and managed by nursing staff during this encounter. I have reviewed the chart and agree with the documentation and plan.   Josephmichael Lisenbee, MD 10/07/2020 4:21 PM 

## 2020-11-24 ENCOUNTER — Other Ambulatory Visit: Payer: Self-pay

## 2020-11-24 DIAGNOSIS — N921 Excessive and frequent menstruation with irregular cycle: Secondary | ICD-10-CM

## 2020-11-24 MED ORDER — NORETHIN ACE-ETH ESTRAD-FE 1-20 MG-MCG PO TABS: 1.0000 | ORAL_TABLET | Freq: Every day | ORAL | 0 refills | Status: DC

## 2020-12-09 ENCOUNTER — Ambulatory Visit: Payer: Medicaid Other

## 2020-12-09 ENCOUNTER — Other Ambulatory Visit: Payer: Self-pay

## 2020-12-09 ENCOUNTER — Other Ambulatory Visit (HOSPITAL_COMMUNITY)
Admission: RE | Admit: 2020-12-09 | Discharge: 2020-12-09 | Disposition: A | Discharge status: Home / Self Care | Payer: Medicaid Other | Source: Ambulatory Visit | Attending: Obstetrics & Gynecology | Admitting: Obstetrics & Gynecology

## 2020-12-09 VITALS — BP 130/89 | HR 90 | Ht 68.5 in | Wt 212.0 lb

## 2020-12-09 DIAGNOSIS — N898 Other specified noninflammatory disorders of vagina: Secondary | ICD-10-CM

## 2020-12-09 NOTE — Progress Notes (Addendum)
SUBJECTIVE:  25 y.o. female complains of malodorous vaginal discharge for few week(s). Denies abnormal vaginal bleeding or significant pelvic pain or fever. No UTI symptoms. Denies history of known exposure to STD.  No LMP recorded. Patient has had an injection.  OBJECTIVE:  She appears well, afebrile. Urine dipstick: not done.  ASSESSMENT:  Vaginal Discharge  Vaginal Odor   PLAN:  GC, chlamydia, trichomonas, BVAG, CVAG probe sent to lab. Treatment: To be determined once lab results are received ROV prn if symptoms persist or worsen.

## 2020-12-10 LAB — CERVICOVAGINAL ANCILLARY ONLY
Bacterial Vaginitis (gardnerella): NEGATIVE
Candida Glabrata: NEGATIVE
Candida Vaginitis: NEGATIVE
Chlamydia: POSITIVE — AB
Comment: NEGATIVE
Comment: NEGATIVE
Comment: NEGATIVE
Comment: NEGATIVE
Comment: NEGATIVE
Comment: NORMAL
Neisseria Gonorrhea: NEGATIVE
Trichomonas: NEGATIVE

## 2020-12-11 ENCOUNTER — Other Ambulatory Visit: Payer: Self-pay | Admitting: *Deleted

## 2020-12-11 ENCOUNTER — Telehealth: Payer: Self-pay | Admitting: *Deleted

## 2020-12-11 ENCOUNTER — Encounter: Payer: Self-pay | Admitting: *Deleted

## 2020-12-11 DIAGNOSIS — A749 Chlamydial infection, unspecified: Secondary | ICD-10-CM

## 2020-12-11 MED ORDER — AZITHROMYCIN 500 MG PO TABS: 1000.0000 mg | ORAL_TABLET | Freq: Every day | ORAL | 0 refills | Status: AC

## 2020-12-11 NOTE — Telephone Encounter (Signed)
TC to patient to inform of positive chlamydia and RX. Azithromycin 2gms sent per v.o. Dr. Donavan Foil. Patient advised of need for treatment of both partners and abstinence for 7 days following treatment. Patient education sent via MyChart. Patient verbalized understanding.

## 2020-12-30 ENCOUNTER — Ambulatory Visit: Payer: Medicaid Other | Admitting: Obstetrics

## 2021-01-06 ENCOUNTER — Ambulatory Visit (INDEPENDENT_AMBULATORY_CARE_PROVIDER_SITE_OTHER): Payer: Medicaid Other

## 2021-01-06 ENCOUNTER — Other Ambulatory Visit: Payer: Self-pay

## 2021-01-06 DIAGNOSIS — Z3042 Encounter for surveillance of injectable contraceptive: Secondary | ICD-10-CM

## 2021-01-06 MED ORDER — MEDROXYPROGESTERONE ACETATE 150 MG/ML IM SUSP
150.0000 mg | Freq: Once | INTRAMUSCULAR | Status: AC
  Administered 2021-01-06: 150 mg via INTRAMUSCULAR

## 2021-01-06 NOTE — Progress Notes (Signed)
Subjective:  Pt in for Depo Provera injection.    Objective: Need for contraception. No unusual complaints.    Assessment:  Depo given LD. Pt tolerated Depo injection.   Plan:  Next injection due Feb 22-Mar 8.

## 2021-01-27 ENCOUNTER — Other Ambulatory Visit: Payer: Self-pay

## 2021-02-03 NOTE — Progress Notes (Signed)
Patient was assessed and managed by nursing staff during this encounter. I have reviewed the chart and agree with the documentation and plan. I have also made any necessary editorial changes. ° °Deion Forgue, MD °02/03/2021 10:53 AM  ° °

## 2021-02-11 ENCOUNTER — Other Ambulatory Visit: Payer: Self-pay

## 2021-02-11 ENCOUNTER — Ambulatory Visit (INDEPENDENT_AMBULATORY_CARE_PROVIDER_SITE_OTHER): Payer: Medicaid Other | Admitting: Obstetrics and Gynecology

## 2021-02-11 ENCOUNTER — Other Ambulatory Visit (HOSPITAL_COMMUNITY)
Admission: RE | Admit: 2021-02-11 | Discharge: 2021-02-11 | Disposition: A | Discharge status: Home / Self Care | Payer: Medicaid Other | Source: Ambulatory Visit | Attending: Obstetrics | Admitting: Obstetrics

## 2021-02-11 ENCOUNTER — Encounter: Payer: Self-pay | Admitting: Obstetrics and Gynecology

## 2021-02-11 DIAGNOSIS — Z01419 Encounter for gynecological examination (general) (routine) without abnormal findings: Secondary | ICD-10-CM

## 2021-02-11 DIAGNOSIS — N921 Excessive and frequent menstruation with irregular cycle: Secondary | ICD-10-CM | POA: Diagnosis not present

## 2021-02-11 DIAGNOSIS — Z3042 Encounter for surveillance of injectable contraceptive: Secondary | ICD-10-CM

## 2021-02-11 MED ORDER — MEDROXYPROGESTERONE ACETATE 150 MG/ML IM SUSP: 150.0000 mg | INTRAMUSCULAR | 4 refills | Status: DC

## 2021-02-11 NOTE — Patient Instructions (Signed)

## 2021-02-11 NOTE — Progress Notes (Signed)
Pt presents for annual and STD testing. Pt requests to discuss BC option. She reports VB since starting Depo x 8 months.  Next Depo scheduled 03/25/21. PHQ9= 3 GAD7= 6

## 2021-02-11 NOTE — Progress Notes (Signed)
Sherry Leach is a 26 y.o. [id number] female here for a routine annual gynecologic exam.  Current complaints: BTB with depo provera o/w no GYN complaints.    Gynecologic History No LMP recorded. Patient has had an injection. Contraception: Depo-Provera injections Last Pap: 8/21. Results were: normal Last mammogram: NA  Obstetric History OB History  Gravida Para Term Preterm AB Living  2 2 2     2   SAB IAB Ectopic Multiple Live Births        0 2    # Outcome Date GA Lbr Len/2nd Weight Sex Delivery Anes PTL Lv  2 Term 04/02/20 [redacted]w[redacted]d 17:25 / 00:07 6 lb 4.9 oz (2.86 kg) F Vag-Spont EPI  LIV     Birth Comments: WNL  1 Term 09/14/17 [redacted]w[redacted]d 05:31 / 00:12 6 lb 12.8 oz (3.085 kg) F Vag-Spont EPI  LIV    Obstetric Comments  2 D&C's for retained products w/ first pregnancy    Past Medical History:  Diagnosis Date   Anemia    Pyelonephritis    Seasonal allergies    SVD (spontaneous vaginal delivery) 09/14/2017   x 1    Past Surgical History:  Procedure Laterality Date   DILATION AND EVACUATION N/A 09/22/2017   Procedure: DILATATION AND EVACUATION;  Surgeon: 09/24/2017, MD;  Location: [facility];  Service: Gynecology;  Laterality: N/A;   HYSTEROSCOPY WITH D & C N/A 12/19/2017   Procedure: DILATATION AND CURETTAGE /DIAGNOSTIC HYSTEROSCOPY;  Surgeon: 12/21/2017, MD;  Location: [facility];  Service: Gynecology;  Laterality: N/A;  rep will be here confirmed on 12/18/17   MOUTH SURGERY     teeth ext   WISDOM TOOTH EXTRACTION      Current Outpatient Medications on File Prior to Visit  Medication Sig Dispense Refill   medroxyPROGESTERone (DEPO-PROVERA) 150 MG/ML injection Inject 1 mL (150 mg total) into the muscle every 3 (three) months. 1 mL 3   norethindrone-ethinyl estradiol-FE (LOESTRIN FE) 1-20 MG-MCG tablet Take 1 tablet by mouth daily. (Patient not taking: Reported on 01/06/2021) 60 tablet 0   Prenatal Vit-Fe Fumarate-FA (MULTIVITAMIN-PRENATAL) 27-0.8 MG TABS tablet Take 1 tablet by  mouth daily at 12 noon. (Patient not taking: Reported on 01/06/2021)     No current facility-administered medications on file prior to visit.    Allergies  Allergen Reactions   Amoxicillin Rash    Has patient had a PCN reaction causing immediate rash, facial/tongue/throat swelling, SOB or lightheadedness with hypotension: No Has patient had a PCN reaction causing severe rash involving mucus membranes or skin necrosis: No Has patient had a PCN reaction that required hospitalization: No Has patient had a PCN reaction occurring within the last 10 years: Yes If all of the above answers are "NO", then may proceed with Cephalosporin use.    Doxycycline Itching and Rash   Penicillins Rash    Has patient had a PCN reaction causing immediate rash, facial/tongue/throat swelling, SOB or lightheadedness with hypotension: No Has patient had a PCN reaction causing severe rash involving mucus membranes or skin necrosis: No Has patient had a PCN reaction that required hospitalization: No Has patient had a PCN reaction occurring within the last 10 years: Yes If all of the above answers are "NO", then may proceed with Cephalosporin use.     Social History   Socioeconomic History   Marital status: Single    Spouse name: Not on file   Number of children: 1   Years of education: Not on file  Highest education level: Not on file  Occupational History   Not on file  Tobacco Use   Smoking status: Former    Packs/day: 0.25    Years: 4.00    Pack years: 1.00    Types: Cigarettes   Smokeless tobacco: Never  Vaping Use   Vaping Use: Every day   Substances: Flavoring  Substance and Sexual Activity   Alcohol use: Not Currently    Comment: last drink was 1 month ago   Drug use: No   Sexual activity: Yes    Partners: Male    Birth control/protection: Injection  Other Topics Concern   Not on file  Social History Narrative   Not on file   Social Determinants of Health   Financial Resource  Strain: Not on file  Food Insecurity: Not on file  Transportation Needs: Not on file  Physical Activity: Not on file  Stress: Not on file  Social Connections: Not on file  Intimate Partner Violence: Not on file    Family History  Problem Relation Age of Onset   Hypertension Mother    Hypertension Other     The following portions of the patient's history were reviewed and updated as appropriate: allergies, current medications, past family history, past medical history, past social history, past surgical history and problem list.  Review of Systems Pertinent items are noted in HPI.   Objective:  BP (!) 138/91    Pulse (!) 101    Ht 5' 8.5" (1.74 m)    Wt 229 lb (103.9 kg)    BMI 34.31 kg/m  CONSTITUTIONAL: Well-developed, well-nourished female in no acute distress.  HENT:  Normocephalic, atraumatic, External right and left ear normal. Oropharynx is clear and moist EYES: Conjunctivae and EOM are normal. Pupils are equal, round, and reactive to light. No scleral icterus.  NECK: Normal range of motion, supple, no masses.  Normal thyroid.  SKIN: Skin is warm and dry. No rash noted. Not diaphoretic. No erythema. No pallor. NEUROLGIC: Alert and oriented to person, place, and time. Normal reflexes, muscle tone coordination. No cranial nerve deficit noted. PSYCHIATRIC: Normal mood and affect. Normal behavior. Normal judgment and thought content. CARDIOVASCULAR: Normal heart rate noted, regular rhythm RESPIRATORY: Clear to auscultation bilaterally. Effort and breath sounds normal, no problems with respiration noted. BREASTS: Symmetric in size. No masses, skin changes, nipple drainage, or lymphadenopathy. ABDOMEN: Soft, normal bowel sounds, no distention noted.  No tenderness, rebound or guarding.  PELVIC: Normal appearing external genitalia; normal appearing vaginal mucosa and cervix.  No abnormal discharge noted.  Pap smear obtained.  Normal uterine size, no other palpable masses, no uterine  or adnexal tenderness. MUSCULOSKELETAL: Normal range of motion. No tenderness.  No cyanosis, clubbing, or edema.  2+ distal pulses.   Assessment:  Annual gynecologic examination with pap smear   Plan:  Will follow up results of pap smear and manage accordingly. Pt desires to continue with Depo Provera for now.  Routine preventative health maintenance measures emphasized. Please refer to After Visit Summary for other counseling recommendations.    Hermina Staggers, MD, FACOG Attending Obstetrician & Gynecologist Center for [facility], [facility]

## 2021-02-11 NOTE — Addendum Note (Signed)
Addended by: Hermina Staggers on: 02/11/2021 03:13 PM   Modules accepted: Orders

## 2021-02-12 LAB — CERVICOVAGINAL ANCILLARY ONLY
Bacterial Vaginitis (gardnerella): NEGATIVE
Chlamydia: NEGATIVE
Comment: NEGATIVE
Comment: NEGATIVE
Comment: NEGATIVE
Comment: NORMAL
Neisseria Gonorrhea: NEGATIVE
Trichomonas: NEGATIVE

## 2021-02-14 ENCOUNTER — Encounter: Payer: Self-pay | Admitting: Obstetrics and Gynecology

## 2021-03-24 ENCOUNTER — Telehealth: Payer: Self-pay | Admitting: *Deleted

## 2021-03-24 ENCOUNTER — Ambulatory Visit: Payer: Medicaid Other | Admitting: Obstetrics and Gynecology

## 2021-03-24 NOTE — Telephone Encounter (Signed)
TC from patient regarding Depo RX. Advised Depo RX sent on day of annual 02/11/21 with 4 refills to [facility].

## 2021-03-25 ENCOUNTER — Ambulatory Visit: Payer: Medicaid Other

## 2021-03-31 ENCOUNTER — Ambulatory Visit (INDEPENDENT_AMBULATORY_CARE_PROVIDER_SITE_OTHER): Payer: Medicaid Other | Admitting: *Deleted

## 2021-03-31 ENCOUNTER — Other Ambulatory Visit: Payer: Self-pay

## 2021-03-31 DIAGNOSIS — Z3042 Encounter for surveillance of injectable contraceptive: Secondary | ICD-10-CM | POA: Diagnosis not present

## 2021-03-31 MED ORDER — MEDROXYPROGESTERONE ACETATE 150 MG/ML IM SUSP
150.0000 mg | Freq: Once | INTRAMUSCULAR | Status: AC
Start: 2021-03-31 — End: 2021-03-31
  Administered 2021-03-31: 150 mg via INTRAMUSCULAR

## 2021-03-31 NOTE — Progress Notes (Signed)
Subjective:  Pt in for Depo Provera injection.   ?  ?Objective: Need for contraception. No unusual complaints.   ?  ?Assessment:  Depo given in LD, pt tolerated well.  ?  ?Plan:  Next injection due May 17-31/2023.   ?  ?

## 2021-03-31 NOTE — Addendum Note (Signed)
Addended by: Marya Landry D on: 03/31/2021 11:22 AM ? ? Modules accepted: Orders ? ?

## 2021-04-05 ENCOUNTER — Ambulatory Visit: Payer: Medicaid Other | Admitting: Obstetrics and Gynecology

## 2021-04-11 ENCOUNTER — Encounter: Payer: Self-pay | Admitting: Obstetrics and Gynecology

## 2021-04-21 ENCOUNTER — Encounter: Payer: Self-pay | Admitting: *Deleted

## 2021-04-21 ENCOUNTER — Other Ambulatory Visit: Payer: Self-pay | Admitting: *Deleted

## 2021-04-21 ENCOUNTER — Ambulatory Visit: Payer: Medicaid Other

## 2021-04-21 DIAGNOSIS — N921 Excessive and frequent menstruation with irregular cycle: Secondary | ICD-10-CM

## 2021-04-21 MED ORDER — DESOGESTREL-ETHINYL ESTRADIOL 0.15-30 MG-MCG PO TABS: 1.0000 | ORAL_TABLET | Freq: Every day | ORAL | 3 refills | Status: DC

## 2021-04-21 NOTE — Progress Notes (Signed)
[facility] RX per Dr. Alysia Penna for breakthrough bleeding on Depo. MyChart message sent. ?

## 2021-04-28 ENCOUNTER — Other Ambulatory Visit (HOSPITAL_COMMUNITY)
Admission: RE | Admit: 2021-04-28 | Discharge: 2021-04-28 | Disposition: A | Discharge status: Home / Self Care | Payer: Medicaid Other | Source: Ambulatory Visit | Attending: Obstetrics | Admitting: Obstetrics

## 2021-04-28 ENCOUNTER — Ambulatory Visit (INDEPENDENT_AMBULATORY_CARE_PROVIDER_SITE_OTHER): Payer: Medicaid Other

## 2021-04-28 ENCOUNTER — Other Ambulatory Visit: Payer: Self-pay

## 2021-04-28 DIAGNOSIS — N898 Other specified noninflammatory disorders of vagina: Secondary | ICD-10-CM | POA: Diagnosis not present

## 2021-04-28 NOTE — Progress Notes (Signed)
SUBJECTIVE:  ?26 y.o. female complains of odorless vaginal discharge for 3 day(s). ?Denies abnormal vaginal bleeding or significant pelvic pain or ?fever. No UTI symptoms. Denies history of known exposure to STD. ? ?No LMP recorded. Patient has had an injection. ? ?OBJECTIVE:  ?She appears well, afebrile. ?Urine dipstick: not done. ? ?ASSESSMENT:  ?Vaginal Discharge  ?Denies Vaginal Odor ? ? ?PLAN:  ?GC, chlamydia, trichomonas, BVAG, CVAG probe sent to lab. ?Treatment: To be determined once lab results are received ?ROV prn if symptoms persist or worsen. ? ?

## 2021-04-29 ENCOUNTER — Other Ambulatory Visit: Payer: Self-pay

## 2021-04-29 DIAGNOSIS — B9689 Other specified bacterial agents as the cause of diseases classified elsewhere: Secondary | ICD-10-CM

## 2021-04-29 LAB — CERVICOVAGINAL ANCILLARY ONLY
Bacterial Vaginitis (gardnerella): POSITIVE — AB
Candida Glabrata: NEGATIVE
Candida Vaginitis: NEGATIVE
Chlamydia: NEGATIVE
Comment: NEGATIVE
Comment: NEGATIVE
Comment: NEGATIVE
Comment: NEGATIVE
Comment: NEGATIVE
Comment: NORMAL
Neisseria Gonorrhea: NEGATIVE
Trichomonas: NEGATIVE

## 2021-04-29 MED ORDER — METRONIDAZOLE 500 MG PO TABS: 500.0000 mg | ORAL_TABLET | Freq: Two times a day (BID) | ORAL | 0 refills | Status: DC

## 2021-04-29 NOTE — Progress Notes (Signed)
Patient was assessed and managed by nursing staff during this encounter. I have reviewed the chart and agree with the documentation and plan. I have also made any necessary editorial changes. ? ?Warden Fillers, MD ?04/29/2021 8:20 AM   ?

## 2021-06-02 ENCOUNTER — Other Ambulatory Visit: Payer: Self-pay | Admitting: Obstetrics and Gynecology

## 2021-06-02 ENCOUNTER — Encounter: Payer: Self-pay | Admitting: Obstetrics and Gynecology

## 2021-06-02 ENCOUNTER — Other Ambulatory Visit: Payer: Self-pay | Admitting: *Deleted

## 2021-06-02 MED ORDER — AYGESTIN 5 MG PO TABS: 5.0000 mg | ORAL_TABLET | Freq: Every day | ORAL | 0 refills | Status: DC

## 2021-06-02 NOTE — Progress Notes (Signed)
Aygestin 5mg  sent per Dr Rip Harbour order for prolong bleeding with Depo. ?Pt made aware of Rx and to make a f/u appt.  ? ?

## 2021-06-23 ENCOUNTER — Ambulatory Visit: Payer: Medicaid Other

## 2021-07-13 ENCOUNTER — Other Ambulatory Visit: Payer: Self-pay | Admitting: Obstetrics and Gynecology

## 2021-07-13 DIAGNOSIS — N921 Excessive and frequent menstruation with irregular cycle: Secondary | ICD-10-CM

## 2021-07-29 ENCOUNTER — Ambulatory Visit (INDEPENDENT_AMBULATORY_CARE_PROVIDER_SITE_OTHER): Payer: Medicaid Other | Admitting: Obstetrics and Gynecology

## 2021-07-29 ENCOUNTER — Encounter: Payer: Self-pay | Admitting: Obstetrics and Gynecology

## 2021-07-29 ENCOUNTER — Other Ambulatory Visit (HOSPITAL_COMMUNITY)
Admission: RE | Admit: 2021-07-29 | Discharge: 2021-07-29 | Disposition: A | Discharge status: Home / Self Care | Payer: Medicaid Other | Source: Ambulatory Visit | Attending: Obstetrics and Gynecology | Admitting: Obstetrics and Gynecology

## 2021-07-29 DIAGNOSIS — N898 Other specified noninflammatory disorders of vagina: Secondary | ICD-10-CM | POA: Insufficient documentation

## 2021-07-29 DIAGNOSIS — Z30430 Encounter for insertion of intrauterine contraceptive device: Secondary | ICD-10-CM

## 2021-07-29 LAB — POCT URINE PREGNANCY: Preg Test, Ur: NEGATIVE

## 2021-07-29 MED ORDER — LEVONORGESTREL 20 MCG/DAY IU IUD
1.0000 | INTRAUTERINE_SYSTEM | Freq: Once | INTRAUTERINE | Status: AC
  Administered 2021-07-29: 1 via INTRAUTERINE

## 2021-07-29 NOTE — Patient Instructions (Signed)

## 2021-07-29 NOTE — Progress Notes (Signed)
Pt presents for IUD insertion. Pt requesting Paragraud IUD. Pt also requesting STI testing.

## 2021-07-29 NOTE — Progress Notes (Signed)
    GYNECOLOGY CLINIC PROCEDURE NOTE  Sherry Leach is a 26 y.o. [phone] here for Mirena IUD insertion. No GYN concerns.    IUD Insertion Procedure Note Patient identified, informed consent performed, consent signed.   Discussed risks of irregular bleeding, cramping, infection, malpositioning or misplacement of the IUD outside the uterus which may require further procedure such as laparoscopy. Time out was performed.  Urine pregnancy test negative.  Speculum placed in the vagina.  Cervix visualized.  Cleaned with Betadine x 2.  Grasped anteriorly with a single tooth tenaculum.  Uterus sounded to 8 cm.  Mirena IUD placed per manufacturer's recommendations.  Strings trimmed to 3 cm. Tenaculum was removed, good hemostasis noted.  Patient tolerated procedure well.   Patient was given post-procedure instructions.  She was advised to have backup contraception for one week.  Patient was also asked to check IUD strings periodically and follow up in 4 weeks for IUD check.    [name]  MD, FACOG Attending Obstetrician & Gynecologist Center for [facility], [facility]

## 2021-07-30 LAB — CERVICOVAGINAL ANCILLARY ONLY
Bacterial Vaginitis (gardnerella): POSITIVE — AB
Candida Glabrata: NEGATIVE
Candida Vaginitis: NEGATIVE
Chlamydia: NEGATIVE
Comment: NEGATIVE
Comment: NEGATIVE
Comment: NEGATIVE
Comment: NEGATIVE
Comment: NEGATIVE
Comment: NORMAL
Neisseria Gonorrhea: NEGATIVE
Trichomonas: NEGATIVE

## 2021-08-04 ENCOUNTER — Other Ambulatory Visit: Payer: Self-pay | Admitting: Obstetrics and Gynecology

## 2021-08-04 DIAGNOSIS — B9689 Other specified bacterial agents as the cause of diseases classified elsewhere: Secondary | ICD-10-CM

## 2021-08-04 MED ORDER — METRONIDAZOLE 500 MG PO TABS: 500.0000 mg | ORAL_TABLET | Freq: Two times a day (BID) | ORAL | 0 refills | Status: DC

## 2021-08-28 ENCOUNTER — Encounter: Payer: Self-pay | Admitting: Obstetrics and Gynecology

## 2021-09-01 ENCOUNTER — Ambulatory Visit: Payer: Medicaid Other | Admitting: Obstetrics and Gynecology

## 2021-09-13 ENCOUNTER — Ambulatory Visit: Payer: Medicaid Other | Admitting: Obstetrics and Gynecology

## 2021-09-22 ENCOUNTER — Ambulatory Visit (INDEPENDENT_AMBULATORY_CARE_PROVIDER_SITE_OTHER): Payer: Medicaid Other | Admitting: Obstetrics and Gynecology

## 2021-09-22 ENCOUNTER — Other Ambulatory Visit (HOSPITAL_COMMUNITY)
Admission: RE | Admit: 2021-09-22 | Discharge: 2021-09-22 | Disposition: A | Discharge status: Home / Self Care | Payer: Medicaid Other | Source: Ambulatory Visit | Attending: Obstetrics and Gynecology | Admitting: Obstetrics and Gynecology

## 2021-09-22 ENCOUNTER — Encounter: Payer: Self-pay | Admitting: Obstetrics and Gynecology

## 2021-09-22 VITALS — BP 137/92 | HR 81 | Ht 68.5 in | Wt 251.2 lb

## 2021-09-22 DIAGNOSIS — N921 Excessive and frequent menstruation with irregular cycle: Secondary | ICD-10-CM

## 2021-09-22 DIAGNOSIS — N898 Other specified noninflammatory disorders of vagina: Secondary | ICD-10-CM | POA: Insufficient documentation

## 2021-09-22 DIAGNOSIS — Z30432 Encounter for removal of intrauterine contraceptive device: Secondary | ICD-10-CM | POA: Diagnosis not present

## 2021-09-22 NOTE — Progress Notes (Signed)
Pt presents for IUD removal. Pt states she has been bleeding since insertion on 07/29/21. Pt intested in BC pill or patch for contraception.   Pt c/o of vaginal odor and discharge and is  requesting STI testing.

## 2021-09-22 NOTE — Progress Notes (Signed)
    GYNECOLOGY CLINIC PROCEDURE NOTE  Sherry Leach is a 26 y.o. [phone] here for Mirena IUD removal.  IUD Removal  Patient identified, informed consent performed, consent signed.  Patient was in the dorsal lithotomy position, normal external genitalia was noted.  A speculum was placed in the patient's vagina, normal discharge was noted, no lesions. The cervix was visualized, no lesions, no abnormal discharge.  The strings of the IUD were grasped and pulled using ring forceps. The IUD was removed in its entirety. visualized,  Patient tolerated the procedure well.    Patient will use condoms for contraception. Routine preventative health maintenance measures emphasized.   [name] MD, FACOG Attending Obstetrician & Gynecologist Center for [facility], [facility]

## 2021-09-23 ENCOUNTER — Ambulatory Visit: Payer: Medicaid Other | Admitting: Obstetrics and Gynecology

## 2021-09-23 LAB — CERVICOVAGINAL ANCILLARY ONLY
Bacterial Vaginitis (gardnerella): POSITIVE — AB
Candida Glabrata: NEGATIVE
Candida Vaginitis: NEGATIVE
Chlamydia: NEGATIVE
Comment: NEGATIVE
Comment: NEGATIVE
Comment: NEGATIVE
Comment: NEGATIVE
Comment: NEGATIVE
Comment: NORMAL
Neisseria Gonorrhea: NEGATIVE
Trichomonas: NEGATIVE

## 2021-09-27 ENCOUNTER — Other Ambulatory Visit: Payer: Self-pay

## 2021-09-27 MED ORDER — METRONIDAZOLE 500 MG PO TABS: 500.0000 mg | ORAL_TABLET | Freq: Two times a day (BID) | ORAL | 0 refills | Status: DC

## 2021-09-27 NOTE — Progress Notes (Signed)
Sent rx for BV per protocol
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
## 2021-10-28 ENCOUNTER — Ambulatory Visit: Payer: Medicaid Other | Admitting: Obstetrics and Gynecology

## 2021-11-22 ENCOUNTER — Telehealth: Payer: Self-pay | Admitting: Emergency Medicine

## 2021-11-22 ENCOUNTER — Encounter: Payer: Self-pay | Admitting: Obstetrics and Gynecology

## 2021-11-23 ENCOUNTER — Telehealth (INDEPENDENT_AMBULATORY_CARE_PROVIDER_SITE_OTHER): Payer: Medicaid Other | Admitting: Family Medicine

## 2021-11-23 DIAGNOSIS — N939 Abnormal uterine and vaginal bleeding, unspecified: Secondary | ICD-10-CM

## 2021-11-23 MED ORDER — NORETHINDRONE ACETATE 5 MG PO TABS: 5.0000 mg | ORAL_TABLET | Freq: Every day | ORAL | 2 refills | Status: DC

## 2021-11-23 NOTE — Progress Notes (Addendum)
I connected with  Sherry Leach on 11/23/21 by a video enabled telemedicine application and verified that I am speaking with the correct person using two identifiers.   I discussed the limitations of evaluation and management by telemedicine. The patient expressed understanding and agreed to proceed.   GYN FU c/o bleeding only stopped for 3 days while she was taking Norethindrone 5mg  tablets, but she is bleeding again.

## 2021-11-23 NOTE — Progress Notes (Signed)
    GYNECOLOGY VIRTUAL VISIT ENCOUNTER NOTE  Provider location: [facility] [place] [facility]   Patient location: Home  I connected with Sherry Leach on 11/23/21 at  9:35 AM EDT by MyChart Video Encounter and verified that I am speaking with the correct person using two identifiers.   I discussed the limitations, risks, security and privacy concerns of performing an evaluation and management service virtually and the availability of in person appointments. I also discussed with the patient that there may be a patient responsible charge related to this service. The patient expressed understanding and agreed to proceed.   History:  Patient is a 26yo [id number] who 2 months post IUD removal. Since her IUD removal, she's had fairly continuous moderate bleeding. She bleed for 4 weeks straight, then took a tablet of norethindrone 5mg  daily for 3 days, which stopped her bleeding. Since then, she returned to bleeding. No palliating or provoking factors.     Past Medical History:  Diagnosis Date   Anemia    Pyelonephritis    Seasonal allergies    SVD (spontaneous vaginal delivery) 09/14/2017   x 1   Past Surgical History:  Procedure Laterality Date   DILATION AND EVACUATION N/A 09/22/2017   Procedure: DILATATION AND EVACUATION;  Surgeon: Aletha Halim, MD;  Location: [facility];  Service: Gynecology;  Laterality: N/A;   HYSTEROSCOPY WITH D & C N/A 12/19/2017   Procedure: DILATATION AND CURETTAGE /DIAGNOSTIC HYSTEROSCOPY;  Surgeon: Osborne Oman, MD;  Location: [facility];  Service: Gynecology;  Laterality: N/A;  rep will be here confirmed on 12/18/17   MOUTH SURGERY     teeth ext   WISDOM TOOTH EXTRACTION     The following portions of the patient's history were reviewed and updated as appropriate: allergies, current medications, past family history, past medical history, past social history, past surgical history and problem list.   Review of Systems:  Pertinent items noted in HPI  and remainder of comprehensive ROS otherwise negative.  Physical Exam:   General:  Alert, oriented and cooperative. Patient appears to be in no acute distress.  Mental Status: Normal mood and affect. Normal behavior. Normal judgment and thought content.   Respiratory: Normal respiratory effort, no problems with respiration noted  Rest of physical exam deferred due to type of encounter  Labs and Imaging No results found for this or any previous visit (from the past 336 hour(s)). No results found.     Assessment and Plan:     1. Abnormal uterine bleeding (AUB) Check [place].  Aygestin 5mg  tablets prescribed daily. F/u in 8 weeks for reeval. - US PELVIS TRANSVAGINAL NON-OB (TV ONLY); Future - norethindrone (AYGESTIN) 5 MG tablet; Take 1 tablet (5 mg total) by mouth daily.  Dispense: 30 tablet; Refill: 2       I discussed the assessment and treatment plan with the patient. The patient was provided an opportunity to ask questions and all were answered. The patient agreed with the plan and demonstrated an understanding of the instructions.   The patient was advised to call back or seek an in-person evaluation/go to the ED if the symptoms worsen or if the condition fails to improve as anticipated.  I provided 12 minutes of face-to-face time during this encounter.   [place] for [employer], [place]

## 2021-11-26 ENCOUNTER — Other Ambulatory Visit (HOSPITAL_COMMUNITY): Payer: Medicaid Other

## 2021-11-29 ENCOUNTER — Ambulatory Visit (HOSPITAL_COMMUNITY): Admission: RE | Admit: 2021-11-29 | Payer: Medicaid Other | Source: Ambulatory Visit

## 2021-12-08 NOTE — Telephone Encounter (Signed)
RC to patient.

## 2022-01-18 ENCOUNTER — Ambulatory Visit: Payer: Medicaid Other | Admitting: Obstetrics and Gynecology

## 2022-01-21 ENCOUNTER — Ambulatory Visit (INDEPENDENT_AMBULATORY_CARE_PROVIDER_SITE_OTHER): Payer: Medicaid Other | Admitting: *Deleted

## 2022-01-21 ENCOUNTER — Other Ambulatory Visit (HOSPITAL_COMMUNITY)
Admission: RE | Admit: 2022-01-21 | Discharge: 2022-01-21 | Disposition: A | Discharge status: Home / Self Care | Payer: Medicaid Other | Source: Ambulatory Visit | Attending: Obstetrics and Gynecology | Admitting: Obstetrics and Gynecology

## 2022-01-21 VITALS — BP 129/89 | HR 102 | Wt 247.0 lb

## 2022-01-21 DIAGNOSIS — N898 Other specified noninflammatory disorders of vagina: Secondary | ICD-10-CM

## 2022-01-21 DIAGNOSIS — Z113 Encounter for screening for infections with a predominantly sexual mode of transmission: Secondary | ICD-10-CM

## 2022-01-21 NOTE — Progress Notes (Signed)
SUBJECTIVE:  26 y.o. female who desires a STI screen. Denies abnormal bleeding or significant pelvic pain. No UTI symptoms. Denies history of known exposure to STD. Reports small amount of white vaginal discharge and mild vaginal irritation.  No LMP recorded. (Menstrual status: IUD).  OBJECTIVE:  She appears well.   ASSESSMENT:  STI Screen   PLAN:  Pt offered STI blood screening-declined GC, chlamydia, and trichomonas probe sent to lab.  Treatment: To be determined once lab results are received.  Pt follow up as needed.

## 2022-01-25 LAB — CERVICOVAGINAL ANCILLARY ONLY
Bacterial Vaginitis (gardnerella): POSITIVE — AB
Candida Glabrata: NEGATIVE
Candida Vaginitis: NEGATIVE
Chlamydia: NEGATIVE
Comment: NEGATIVE
Comment: NEGATIVE
Comment: NEGATIVE
Comment: NEGATIVE
Comment: NEGATIVE
Comment: NORMAL
Neisseria Gonorrhea: NEGATIVE
Trichomonas: NEGATIVE

## 2022-01-26 ENCOUNTER — Other Ambulatory Visit: Payer: Self-pay | Admitting: Obstetrics and Gynecology

## 2022-01-26 MED ORDER — METRONIDAZOLE 500 MG PO TABS: 500.0000 mg | ORAL_TABLET | Freq: Two times a day (BID) | ORAL | 0 refills | Status: DC

## 2022-07-24 ENCOUNTER — Emergency Department (HOSPITAL_BASED_OUTPATIENT_CLINIC_OR_DEPARTMENT_OTHER)
Admission: EM | Admit: 2022-07-24 | Discharge: 2022-07-24 | Disposition: A | Discharge status: Home / Self Care | Payer: Medicaid Other | Attending: Emergency Medicine | Admitting: Emergency Medicine

## 2022-07-24 ENCOUNTER — Encounter (HOSPITAL_BASED_OUTPATIENT_CLINIC_OR_DEPARTMENT_OTHER): Payer: Self-pay

## 2022-07-24 ENCOUNTER — Emergency Department (HOSPITAL_BASED_OUTPATIENT_CLINIC_OR_DEPARTMENT_OTHER): Payer: Medicaid Other

## 2022-07-24 ENCOUNTER — Other Ambulatory Visit: Payer: Self-pay

## 2022-07-24 DIAGNOSIS — W1781XA Fall down embankment (hill), initial encounter: Secondary | ICD-10-CM | POA: Diagnosis not present

## 2022-07-24 DIAGNOSIS — S93431A Sprain of tibiofibular ligament of right ankle, initial encounter: Secondary | ICD-10-CM | POA: Diagnosis not present

## 2022-07-24 DIAGNOSIS — S93491A Sprain of other ligament of right ankle, initial encounter: Secondary | ICD-10-CM | POA: Diagnosis not present

## 2022-07-24 DIAGNOSIS — M25571 Pain in right ankle and joints of right foot: Secondary | ICD-10-CM | POA: Diagnosis present

## 2022-07-24 DIAGNOSIS — Z043 Encounter for examination and observation following other accident: Secondary | ICD-10-CM | POA: Diagnosis not present

## 2022-07-24 NOTE — ED Notes (Signed)
Pt declined wheelchair.

## 2022-07-24 NOTE — ED Provider Notes (Signed)
[place] [facility] Provider Note   CSN: [id number] Arrival date & time: 07/24/22  1440     History  Chief Complaint  Patient presents with   Joint Swelling    Sherry Leach is a 27 y.o. female with overall noncontributory past medical history presents with concern for swelling, pain in her right ankle after fall down a hill last night.  Patient reports she was drinking at the time.  She reports some tingling in her toes and limited movement of the ankle.  She reports the she took some Excedrin this morning which helped with her symptoms but she otherwise is having a hard time walking on the ankle secondary to pain.  She reports that she is able to walk on the ankle however.  She denies any head injury or other significant symptoms at this time.  Denies any known loss of consciousness.  HPI     Home Medications Prior to Admission medications   Medication Sig Start Date End Date Taking? Authorizing Provider  metroNIDAZOLE (FLAGYL) 500 MG tablet Take 1 tablet (500 mg total) by mouth 2 (two) times daily. 01/26/22   Lennart Pall, MD  norethindrone (AYGESTIN) 5 MG tablet Take 1 tablet (5 mg total) by mouth daily. 11/23/21   Levie Heritage, DO      Allergies    Amoxicillin, Doxycycline, and Penicillins    Review of Systems   Review of Systems  All other systems reviewed and are negative.   Physical Exam Updated Vital Signs BP 122/84   Pulse 86   Temp 98.6 F (37 C)   Resp 17   Ht 5\' 8"  (1.727 m)   Wt 108.9 kg   LMP 07/05/2022 (Exact Date)   SpO2 98%   BMI 36.49 kg/m  Physical Exam Vitals and nursing note reviewed.  Constitutional:      General: She is not in acute distress.    Appearance: Normal appearance.  HENT:     Head: Normocephalic and atraumatic.  Eyes:     General:        Right eye: No discharge.        Left eye: No discharge.  Cardiovascular:     Rate and Rhythm: Normal rate and regular rhythm.     Pulses:  Normal pulses.  Pulmonary:     Effort: Pulmonary effort is normal. No respiratory distress.  Musculoskeletal:        General: No deformity.     Comments: Patient with significant swelling, and focal tenderness over her lateral malleolus, ATFL distribution.  She has some pain with eversion, inversion of the right ankle.  She has intact plantarflexion, dorsiflexion with some pain.  She can ambulate with some difficulty.  Skin:    General: Skin is warm and dry.     Capillary Refill: Capillary refill takes less than 2 seconds.  Neurological:     Mental Status: She is alert and oriented to person, place, and time.  Psychiatric:        Mood and Affect: Mood normal.        Behavior: Behavior normal.     ED Results / Procedures / Treatments   Labs (all labs ordered are listed, but only abnormal results are displayed) Labs Reviewed - No data to display  EKG None  Radiology DG Foot Complete Right  Result Date: 07/24/2022 CLINICAL DATA:  Trip and fall last night EXAM: RIGHT FOOT COMPLETE - 3+ VIEW COMPARISON:  None Available.  FINDINGS: There is no evidence of fracture or dislocation. There is no evidence of arthropathy or other focal bone abnormality. Soft tissues are unremarkable. IMPRESSION: No fracture or dislocation of the right foot. Electronically Signed   By: Jearld Lesch M.D.   On: 07/24/2022 15:58    Procedures Procedures    Medications Ordered in ED Medications - No data to display  ED Course/ Medical Decision Making/ A&P Clinical Course as of 07/24/22 1710  Sun Jul 24, 2022  1640 Tripped, fell, roll down hill. Swelling around lateral malleolus. Amb with limp. Pain at ankle, but shoots up her leg.  [CP]    Clinical Course User Index [CP] Olene Floss, PA-C                             Medical Decision Making Amount and/or Complexity of Data Reviewed Radiology: ordered.   This patient is a 27 y.o. female who presents to the ED for concern of right ankle pain  since last night after falling while intoxicated.  She reports she has been able to bear weight with some difficulty.  Differential diagnoses prior to evaluation: Ankle sprain, dislocation, fracture, versus other  Past Medical History / Social History / Additional history: Chart reviewed. Pertinent results include: Overall noncontributory  Physical Exam: Physical exam performed. The pertinent findings include: Patient with significant swelling, and focal tenderness over her lateral malleolus, ATFL distribution.  She has some pain with eversion, inversion of the right ankle.  She has intact plantarflexion, dorsiflexion with some pain.  She can ambulate with some difficulty.   Neurovascularly intact throughout, intact distal capillary refill.  Significant soft tissue swelling over the lateral malleolus.  Medications / Treatment: Encouraged RICE, ibuprofen, Tylenol, ASO brace, and ankle sprain rehab exercises.  Discussed extensive return precautions.   Disposition: After consideration of the diagnostic results and the patients response to treatment, I feel that patient is stable for discharge with plan as above.   emergency department workup does not suggest an emergent condition requiring admission or immediate intervention beyond what has been performed at this time. The plan is: as above. The patient is safe for discharge and has been instructed to return immediately for worsening symptoms, change in symptoms or any other concerns.  Final Clinical Impression(s) / ED Diagnoses Final diagnoses:  Sprain of anterior talofibular ligament of right ankle, initial encounter    Rx / DC Orders ED Discharge Orders     None         [place] 07/24/22 1711    Arby Barrette, MD 07/24/22 2304

## 2022-07-24 NOTE — Discharge Instructions (Signed)
Please use Tylenol or ibuprofen for pain.  You may use 600 mg ibuprofen every 6 hours or 1000 mg of Tylenol every 6 hours.  You may choose to alternate between the 2.  This would be most effective.  Not to exceed 4 g of Tylenol within 24 hours.  Not to exceed 3200 mg ibuprofen 24 hours.  I recommend rest, ice, compression, elevation of the affected extremity, you can discontinue wearing the ankle brace after 1 to 2 weeks if you are not tolerating it, and follow-up with orthopedic physician if you do not have significant improvement of symptoms after around 2 weeks.

## 2022-07-24 NOTE — ED Triage Notes (Signed)
Pt reports swelling and pain in her right ankle after a fall down a hill last night. Pt reports tingling toes, and limited movement. Pulse palpable, cap refill <3 sec  Pt took excedrin this morning

## 2022-08-12 ENCOUNTER — Ambulatory Visit: Payer: Medicaid Other

## 2022-08-16 ENCOUNTER — Other Ambulatory Visit (HOSPITAL_COMMUNITY)
Admission: RE | Admit: 2022-08-16 | Discharge: 2022-08-16 | Disposition: A | Discharge status: Home / Self Care | Payer: Medicaid Other | Source: Ambulatory Visit | Attending: Student | Admitting: Student

## 2022-08-16 ENCOUNTER — Ambulatory Visit (INDEPENDENT_AMBULATORY_CARE_PROVIDER_SITE_OTHER): Payer: Medicaid Other | Admitting: *Deleted

## 2022-08-16 VITALS — BP 139/95 | HR 76

## 2022-08-16 DIAGNOSIS — N898 Other specified noninflammatory disorders of vagina: Secondary | ICD-10-CM

## 2022-08-16 NOTE — Progress Notes (Signed)
SUBJECTIVE:  27 y.o. female complains of copious vaginal discharge for 1 week(s). Denies abnormal vaginal bleeding or significant pelvic pain or fever. No UTI symptoms. Denies history of known exposure to STD.  Patient's last menstrual period was 07/05/2022 (exact date).  OBJECTIVE:  She appears well, afebrile. Urine dipstick: not done.  ASSESSMENT:  Vaginal Discharge  Vaginal Odor   PLAN:  GC, chlamydia, trichomonas, BVAG, CVAG probe sent to lab. Treatment: To be determined once lab results are received ROV prn if symptoms persist or worsen.  Pt declines STI blood panel

## 2022-08-18 LAB — CERVICOVAGINAL ANCILLARY ONLY
Bacterial Vaginitis (gardnerella): POSITIVE — AB
Candida Glabrata: NEGATIVE
Candida Vaginitis: NEGATIVE
Chlamydia: NEGATIVE
Comment: NEGATIVE
Comment: NEGATIVE
Comment: NEGATIVE
Comment: NEGATIVE
Comment: NEGATIVE
Comment: NORMAL
Neisseria Gonorrhea: NEGATIVE
Trichomonas: NEGATIVE

## 2022-08-22 ENCOUNTER — Telehealth: Payer: Self-pay

## 2022-08-22 ENCOUNTER — Other Ambulatory Visit: Payer: Self-pay

## 2022-08-22 DIAGNOSIS — B9689 Other specified bacterial agents as the cause of diseases classified elsewhere: Secondary | ICD-10-CM

## 2022-08-22 MED ORDER — METRONIDAZOLE 500 MG PO TABS: 500.0000 mg | ORAL_TABLET | Freq: Two times a day (BID) | ORAL | 0 refills | Status: DC

## 2022-08-22 NOTE — Telephone Encounter (Signed)
I connected with  Sherry Leach on 08/22/22 by telephone and verified that I am speaking with the correct person using two identifiers.   Pt informed of lab results. Rx sent.

## 2022-08-22 NOTE — Telephone Encounter (Signed)
-----   Message from Warden Fillers sent at 08/21/2022 11:21 PM EDT ----- BV noted on swab, offer treatment

## 2023-01-04 IMAGING — DX DG CHEST 1V PORT
1 series · 1 of 1 positions shown · non-contrast
Comparison: May 31, 2019.

CLINICAL DATA: Back pain.

EXAM:
PORTABLE CHEST 1 VIEW

[chest ap]
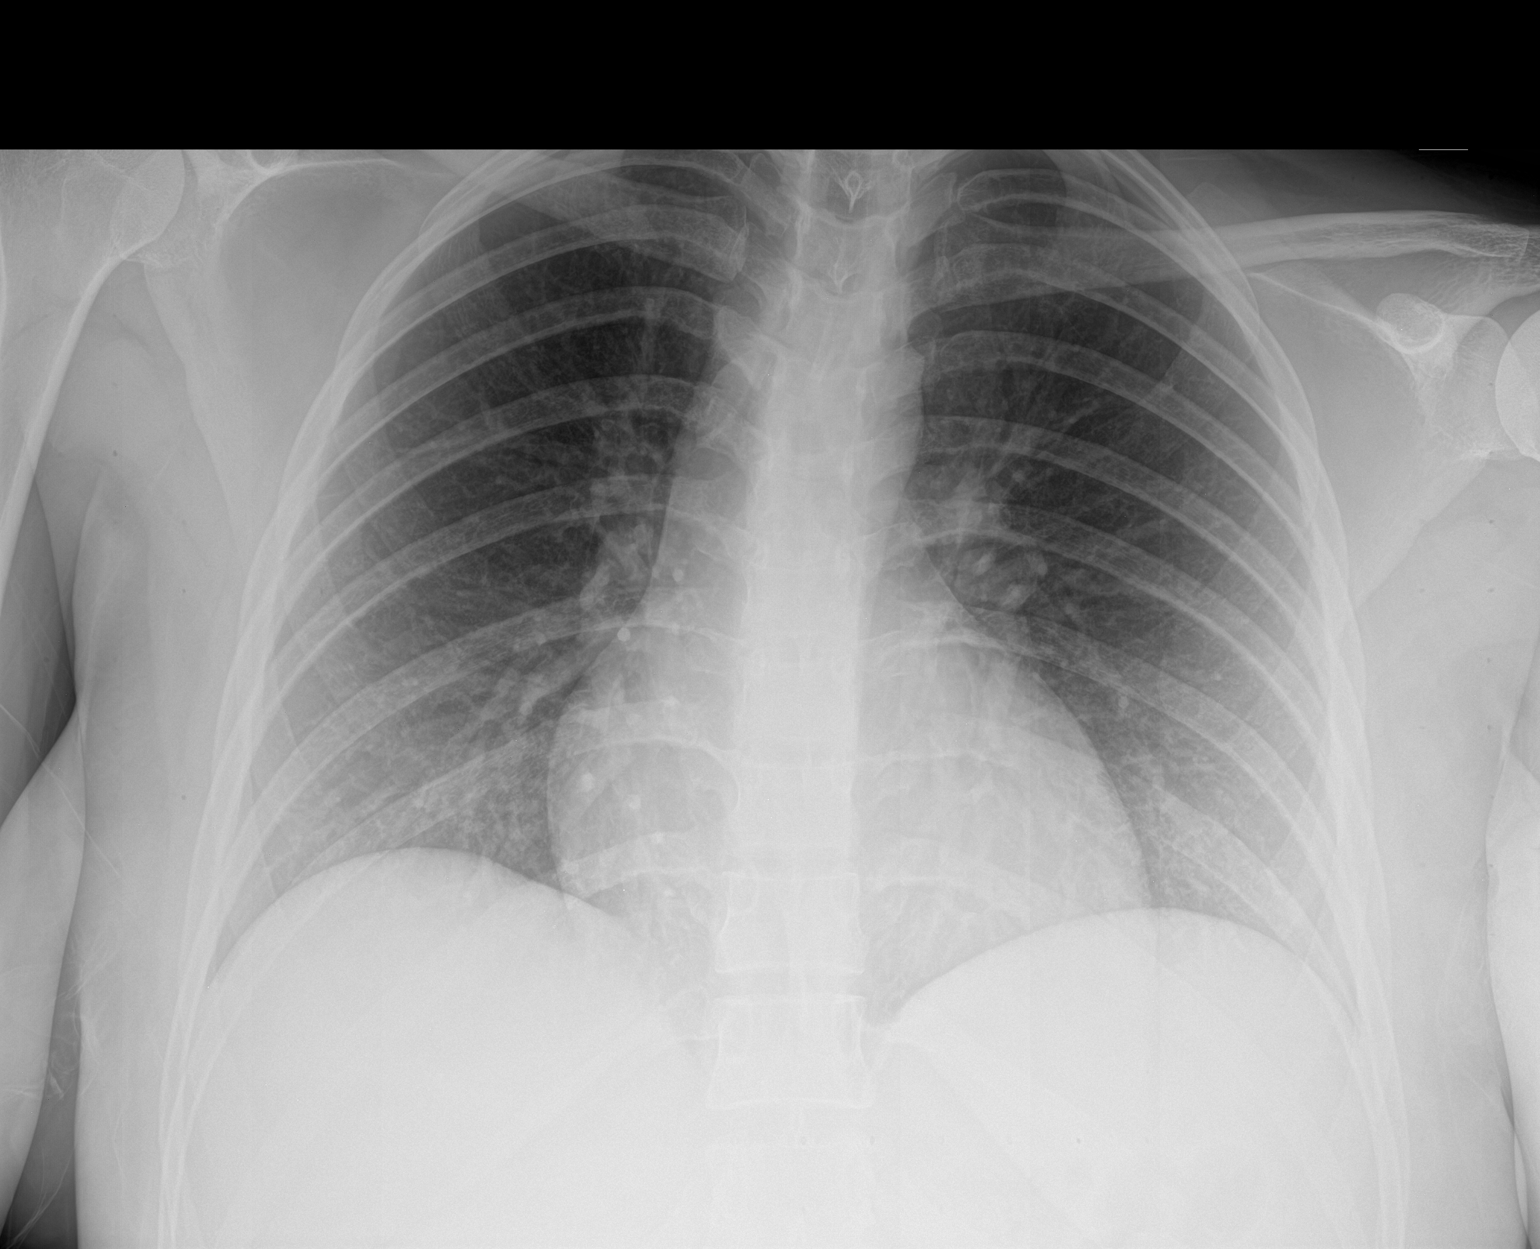

[1 of 1 positions shown; findings below may reference images not displayed]

FINDINGS: The heart size and mediastinal contours are within normal limits.
Both lungs are clear. No pneumothorax or pleural effusion is noted.
The visualized skeletal structures are unremarkable.
IMPRESSION: No active disease.

## 2024-01-14 ENCOUNTER — Encounter (HOSPITAL_COMMUNITY): Payer: Self-pay | Admitting: Obstetrics & Gynecology

## 2024-01-14 ENCOUNTER — Inpatient Hospital Stay (HOSPITAL_COMMUNITY)
Admission: AD | Admit: 2024-01-14 | Discharge: 2024-01-14 | Disposition: A | Discharge status: Home / Self Care | Payer: Self-pay | Attending: Obstetrics & Gynecology | Admitting: Obstetrics & Gynecology

## 2024-01-14 ENCOUNTER — Inpatient Hospital Stay (HOSPITAL_COMMUNITY): Payer: Self-pay

## 2024-01-14 DIAGNOSIS — O209 Hemorrhage in early pregnancy, unspecified: Secondary | ICD-10-CM

## 2024-01-14 DIAGNOSIS — Z3A01 Less than 8 weeks gestation of pregnancy: Secondary | ICD-10-CM

## 2024-01-14 LAB — URINALYSIS, ROUTINE W REFLEX MICROSCOPIC
Bilirubin Urine: NEGATIVE
Glucose, UA: NEGATIVE mg/dL
Hgb urine dipstick: NEGATIVE
Ketones, ur: NEGATIVE mg/dL
Nitrite: NEGATIVE
Protein, ur: NEGATIVE mg/dL
Specific Gravity, Urine: 1.018 (ref 1.005–1.030)
pH: 6 (ref 5.0–8.0)

## 2024-01-14 LAB — CBC
HCT: 35.5 % — ABNORMAL LOW (ref 36.0–46.0)
Hemoglobin: 11.1 g/dL — ABNORMAL LOW (ref 12.0–15.0)
MCH: 24.9 pg — ABNORMAL LOW (ref 26.0–34.0)
MCHC: 31.3 g/dL (ref 30.0–36.0)
MCV: 79.8 fL — ABNORMAL LOW (ref 80.0–100.0)
Platelets: 205 K/uL (ref 150–400)
RBC: 4.45 MIL/uL (ref 3.87–5.11)
RDW: 16.5 % — ABNORMAL HIGH (ref 11.5–15.5)
WBC: 10.9 K/uL — ABNORMAL HIGH (ref 4.0–10.5)
nRBC: 0 % (ref 0.0–0.2)

## 2024-01-14 LAB — HCG, QUANTITATIVE, PREGNANCY: hCG, Beta Chain, Quant, S: 3296 m[IU]/mL — ABNORMAL HIGH (ref <5)

## 2024-01-14 LAB — WET PREP, GENITAL
Clue Cells Wet Prep HPF POC: NONE SEEN
Sperm: NONE SEEN
Trich, Wet Prep: NONE SEEN
WBC, Wet Prep HPF POC: >=10 — AB (ref <10)
Yeast Wet Prep HPF POC: NONE SEEN

## 2024-01-14 LAB — POCT PREGNANCY, URINE: Preg Test, Ur: POSITIVE — AB

## 2024-01-14 MED ORDER — PRENATAL 28-0.8 MG PO TABS: 1.0000 | ORAL_TABLET | Freq: Every day | ORAL | 0 refills | Status: AC

## 2024-01-14 NOTE — MAU Note (Cosign Needed)
 Maternal Assessment Unit Provider Note  Subjective: [name] is a 28 y.o. [id number] pregnant female at [redacted]w[redacted]d who presents to MAU today with complaint of vaginal bleeding.   She woke up at 2 AM with underwear soaked in blood.  Bleeding continued till about 9 to 10 AM.  This is continued as pelvic cramping and spotting throughout the rest of the day.  She denies recent intercourse.  She currently has a 5 out of 10 level discomfort with cramping.  Other symptoms include chills last night and urinary urgency.  Treatments tried include acetaminophen .  She is not on blood thinners.  Currently does not have insurance.  Unsure whether she plans to keep the pregnancy.  [facility] at [facility] with her most recent pregnancy. Prenatal records reviewed.  Pertinent items noted in HPI and remainder of comprehensive ROS otherwise negative.   Objective: BP 135/88   Pulse 99   Temp 98.5 F (36.9 C)   Resp 18   Ht 5' 8.5 (1.74 m)   Wt 123.4 kg   LMP 12/12/2023   BMI 40.76 kg/m  Physical Exam Vitals and nursing note reviewed.  Constitutional:      General: She is not in acute distress.    Appearance: She is well-developed. She is not ill-appearing or diaphoretic.  HENT:     Head: Normocephalic and atraumatic.  Eyes:     General: No scleral icterus. Cardiovascular:     Rate and Rhythm: Normal rate and regular rhythm.  Pulmonary:     Effort: Pulmonary effort is normal. No respiratory distress.  Abdominal:     General: There is no distension.     Palpations: Abdomen is soft.     Tenderness: There is abdominal tenderness. There is no guarding or rebound.     Comments: Mild suprapubic tenderness  Skin:    General: Skin is warm and dry.  Neurological:     Mental Status: She is alert.     Coordination: Coordination normal.    Results for orders placed or performed during the hospital encounter of 01/14/24 (from the past 24 hours)  Wet prep, genital     Status: Abnormal    Collection Time: 01/14/24  6:40 PM  Result Value Ref Range   Yeast Wet Prep HPF POC NONE SEEN NONE SEEN   Trich, Wet Prep NONE SEEN NONE SEEN   Clue Cells Wet Prep HPF POC NONE SEEN NONE SEEN   WBC, Wet Prep HPF POC >=10 (A) <10   Sperm NONE SEEN   Urinalysis, Routine w reflex microscopic -Urine, Clean Catch     Status: Abnormal   Collection Time: 01/14/24  6:40 PM  Result Value Ref Range   Color, Urine YELLOW YELLOW   APPearance HAZY (A) CLEAR   Specific Gravity, Urine 1.018 1.005 - 1.030   pH 6.0 5.0 - 8.0   Glucose, UA NEGATIVE NEGATIVE mg/dL   Hgb urine dipstick NEGATIVE NEGATIVE   Bilirubin Urine NEGATIVE NEGATIVE   Ketones, ur NEGATIVE NEGATIVE mg/dL   Protein, ur NEGATIVE NEGATIVE mg/dL   Nitrite NEGATIVE NEGATIVE   Leukocytes,Ua TRACE (A) NEGATIVE   RBC / HPF 0-5 0 - 5 RBC/hpf   WBC, UA 6-10 0 - 5 WBC/hpf   Bacteria, UA RARE (A) NONE SEEN   Squamous Epithelial / HPF 6-10 0 - 5 /HPF   Mucus PRESENT   Pregnancy, urine POC     Status: Abnormal   Collection Time: 01/14/24  6:41 PM  Result Value Ref Range   Preg Test, Ur POSITIVE (A) NEGATIVE  CBC     Status: Abnormal   Collection Time: 01/14/24  7:15 PM  Result Value Ref Range   WBC 10.9 (H) 4.0 - 10.5 K/uL   RBC 4.45 3.87 - 5.11 MIL/uL   Hemoglobin 11.1 (L) 12.0 - 15.0 g/dL   HCT 64.4 (L) 63.9 - 53.9 %   MCV 79.8 (L) 80.0 - 100.0 fL   MCH 24.9 (L) 26.0 - 34.0 pg   MCHC 31.3 30.0 - 36.0 g/dL   RDW 83.4 (H) 88.4 - 84.4 %   Platelets 205 150 - 400 K/uL   nRBC 0.0 0.0 - 0.2 %  hCG, quantitative, pregnancy     Status: Abnormal   Collection Time: 01/14/24  7:15 PM  Result Value Ref Range   hCG, Beta Chain, Quant, S 3,296 (H) <5 mIU/mL     MDM: moderate risk  MAU Course:  Time: 11:06 PM  Reviewed results with patient.  Benign exam.  Vitals normal.  Discussed follow-up plan with Dr. Reeta.  Given only small gestational sac seen on ultrasound, recommend 48-hour hCG level.  Stat hCG appointment scheduled at  [facility].  Questions were answered to the satisfaction of the patient and/or family prior to discharge.   Assessment Medical screening exam complete    ICD-10-CM   1. [redacted] weeks gestation of pregnancy  Z3A.01     2. Vaginal bleeding affecting early pregnancy  O20.9        Plan Discharge from MAU in stable condition with strict return precautions for severe pelvic pain/severe vaginal bleeding, lightheadedness/anemia symptoms. Follow up at [facility] as scheduled for ongoing prenatal care  Allergies as of 01/14/2024       Reactions   Amoxicillin  Rash   Has patient had a PCN reaction causing immediate rash, facial/tongue/throat swelling, SOB or lightheadedness with hypotension: No Has patient had a PCN reaction causing severe rash involving mucus membranes or skin necrosis: No Has patient had a PCN reaction that required hospitalization: No Has patient had a PCN reaction occurring within the last 10 years: Yes If all of the above answers are NO, then may proceed with Cephalosporin use.   Doxycycline  Itching, Rash   Penicillins Rash   Has patient had a PCN reaction causing immediate rash, facial/tongue/throat swelling, SOB or lightheadedness with hypotension: No Has patient had a PCN reaction causing severe rash involving mucus membranes or skin necrosis: No Has patient had a PCN reaction that required hospitalization: No Has patient had a PCN reaction occurring within the last 10 years: Yes If all of the above answers are NO, then may proceed with Cephalosporin use.        Medication List     STOP taking these medications    metroNIDAZOLE  500 MG tablet Commonly known as: FLAGYL    norethindrone  5 MG tablet Commonly known as: AYGESTIN        TAKE these medications    Prenatal 28-0.8 MG Tabs Take 1 tablet by mouth daily.        Trudy Leeroy NOVAK, MD 01/14/2024 11:06 PM

## 2024-01-14 NOTE — MAU Note (Signed)
 Sherry Leach is a 28 y.o. at [redacted]w[redacted]d here in MAU reporting: had heavy vag bleeding last night soaked underwear and soaked  a pad this morning. Bleeding has decreased now to spotting . Was having cramping last night but better today. Denies any recent intercourse  LMP: 12/12/2023 Onset of complaint: last night Pain score: 6 Vitals:   01/14/24 1858  BP: 135/88  Pulse: 99  Resp: 18  Temp: 98.5 F (36.9 C)     FHT: n/a  Lab orders placed from triage: wet prep, gc, u/a.

## 2024-01-15 ENCOUNTER — Telehealth (HOSPITAL_COMMUNITY): Payer: Self-pay | Admitting: *Deleted

## 2024-01-15 LAB — GC/CHLAMYDIA PROBE AMP (~~LOC~~) NOT AT ARMC
Chlamydia: NEGATIVE
Comment: NEGATIVE
Comment: NORMAL
Neisseria Gonorrhea: NEGATIVE

## 2024-01-16 ENCOUNTER — Ambulatory Visit: Payer: Self-pay

## 2024-01-16 LAB — CULTURE, OB URINE: Culture: <10000 — AB
